# Patient Record
Sex: Female | Born: 1955 | Race: White | Hispanic: No | Smoking: Never smoker
Health system: Southern US, Community
[De-identification: ages and names within clinical notes are randomized; demographics above are authoritative.]

## PROBLEM LIST (undated history)

## (undated) DIAGNOSIS — G479 Sleep disorder, unspecified: Secondary | ICD-10-CM

## (undated) DIAGNOSIS — F32A Depression, unspecified: Secondary | ICD-10-CM

## (undated) DIAGNOSIS — K635 Polyp of colon: Secondary | ICD-10-CM

## (undated) DIAGNOSIS — R079 Chest pain, unspecified: Secondary | ICD-10-CM

## (undated) DIAGNOSIS — K760 Fatty (change of) liver, not elsewhere classified: Secondary | ICD-10-CM

## (undated) DIAGNOSIS — M7989 Other specified soft tissue disorders: Secondary | ICD-10-CM

## (undated) DIAGNOSIS — M2550 Pain in unspecified joint: Secondary | ICD-10-CM

## (undated) DIAGNOSIS — Q8789 Other specified congenital malformation syndromes, not elsewhere classified: Secondary | ICD-10-CM

## (undated) DIAGNOSIS — J189 Pneumonia, unspecified organism: Secondary | ICD-10-CM

## (undated) DIAGNOSIS — K5790 Diverticulosis of intestine, part unspecified, without perforation or abscess without bleeding: Secondary | ICD-10-CM

## (undated) DIAGNOSIS — K5900 Constipation, unspecified: Secondary | ICD-10-CM

## (undated) DIAGNOSIS — T7840XA Allergy, unspecified, initial encounter: Secondary | ICD-10-CM

## (undated) DIAGNOSIS — I10 Essential (primary) hypertension: Secondary | ICD-10-CM

## (undated) DIAGNOSIS — N952 Postmenopausal atrophic vaginitis: Secondary | ICD-10-CM

## (undated) DIAGNOSIS — L900 Lichen sclerosus et atrophicus: Secondary | ICD-10-CM

## (undated) DIAGNOSIS — R011 Cardiac murmur, unspecified: Secondary | ICD-10-CM

## (undated) DIAGNOSIS — H409 Unspecified glaucoma: Secondary | ICD-10-CM

## (undated) DIAGNOSIS — L853 Xerosis cutis: Secondary | ICD-10-CM

## (undated) DIAGNOSIS — J939 Pneumothorax, unspecified: Secondary | ICD-10-CM

## (undated) DIAGNOSIS — H43399 Other vitreous opacities, unspecified eye: Secondary | ICD-10-CM

## (undated) DIAGNOSIS — R0602 Shortness of breath: Secondary | ICD-10-CM

## (undated) DIAGNOSIS — K829 Disease of gallbladder, unspecified: Secondary | ICD-10-CM

## (undated) DIAGNOSIS — F329 Major depressive disorder, single episode, unspecified: Secondary | ICD-10-CM

## (undated) DIAGNOSIS — K859 Acute pancreatitis without necrosis or infection, unspecified: Secondary | ICD-10-CM

## (undated) DIAGNOSIS — B009 Herpesviral infection, unspecified: Secondary | ICD-10-CM

## (undated) DIAGNOSIS — E785 Hyperlipidemia, unspecified: Secondary | ICD-10-CM

## (undated) HISTORY — DX: Hyperlipidemia, unspecified: E78.5

## (undated) HISTORY — DX: Acute pancreatitis without necrosis or infection, unspecified: K85.90

## (undated) HISTORY — DX: Constipation, unspecified: K59.00

## (undated) HISTORY — DX: Essential (primary) hypertension: I10

## (undated) HISTORY — DX: Allergy, unspecified, initial encounter: T78.40XA

## (undated) HISTORY — PX: OTHER SURGICAL HISTORY: SHX169

## (undated) HISTORY — DX: Postmenopausal atrophic vaginitis: N95.2

## (undated) HISTORY — PX: ABDOMINAL HYSTERECTOMY: SHX81

## (undated) HISTORY — DX: Other vitreous opacities, unspecified eye: H43.399

## (undated) HISTORY — DX: Disease of gallbladder, unspecified: K82.9

## (undated) HISTORY — DX: Unspecified glaucoma: H40.9

## (undated) HISTORY — DX: Pain in unspecified joint: M25.50

## (undated) HISTORY — PX: COLONOSCOPY: SHX174

## (undated) HISTORY — DX: Pneumothorax, unspecified: J93.9

## (undated) HISTORY — DX: Fatty (change of) liver, not elsewhere classified: K76.0

## (undated) HISTORY — DX: Major depressive disorder, single episode, unspecified: F32.9

## (undated) HISTORY — PX: UPPER GASTROINTESTINAL ENDOSCOPY: SHX188

## (undated) HISTORY — DX: Cardiac murmur, unspecified: R01.1

## (undated) HISTORY — PX: BREAST LUMPECTOMY: SHX2

## (undated) HISTORY — DX: Other specified soft tissue disorders: M79.89

## (undated) HISTORY — DX: Shortness of breath: R06.02

## (undated) HISTORY — PX: POLYPECTOMY: SHX149

## (undated) HISTORY — DX: Lichen sclerosus et atrophicus: L90.0

## (undated) HISTORY — DX: Sleep disorder, unspecified: G47.9

## (undated) HISTORY — DX: Xerosis cutis: L85.3

## (undated) HISTORY — DX: Chest pain, unspecified: R07.9

## (undated) HISTORY — DX: Diverticulosis of intestine, part unspecified, without perforation or abscess without bleeding: K57.90

## (undated) HISTORY — DX: Other specified congenital malformation syndromes, not elsewhere classified: Q87.89

## (undated) HISTORY — PX: CHOLECYSTECTOMY: SHX55

## (undated) HISTORY — DX: Depression, unspecified: F32.A

## (undated) HISTORY — DX: Polyp of colon: K63.5

## (undated) HISTORY — DX: Herpesviral infection, unspecified: B00.9

---

## 1997-12-10 HISTORY — PX: BREAST EXCISIONAL BIOPSY: SUR124

## 1998-03-24 ENCOUNTER — Ambulatory Visit (HOSPITAL_COMMUNITY): Admission: RE | Admit: 1998-03-24 | Discharge: 1998-03-24 | Payer: Self-pay | Admitting: General Surgery

## 1998-05-20 ENCOUNTER — Other Ambulatory Visit: Admission: RE | Admit: 1998-05-20 | Discharge: 1998-05-20 | Payer: Self-pay | Admitting: Obstetrics and Gynecology

## 1998-06-01 ENCOUNTER — Ambulatory Visit (HOSPITAL_COMMUNITY): Admission: RE | Admit: 1998-06-01 | Discharge: 1998-06-01 | Payer: Self-pay | Admitting: Obstetrics and Gynecology

## 1998-06-02 ENCOUNTER — Ambulatory Visit (HOSPITAL_COMMUNITY): Admission: RE | Admit: 1998-06-02 | Discharge: 1998-06-02 | Payer: Self-pay | Admitting: Obstetrics and Gynecology

## 1998-06-14 ENCOUNTER — Ambulatory Visit (HOSPITAL_COMMUNITY): Admission: RE | Admit: 1998-06-14 | Discharge: 1998-06-14 | Payer: Self-pay | Admitting: *Deleted

## 1998-06-14 HISTORY — PX: BREAST BIOPSY: SHX20

## 1998-12-28 ENCOUNTER — Ambulatory Visit (HOSPITAL_COMMUNITY): Admission: RE | Admit: 1998-12-28 | Discharge: 1998-12-28 | Payer: Self-pay | Admitting: *Deleted

## 1999-05-26 ENCOUNTER — Ambulatory Visit (HOSPITAL_COMMUNITY): Admission: RE | Admit: 1999-05-26 | Discharge: 1999-05-26 | Payer: Self-pay | Admitting: Obstetrics and Gynecology

## 1999-06-01 ENCOUNTER — Other Ambulatory Visit: Admission: RE | Admit: 1999-06-01 | Discharge: 1999-06-01 | Payer: Self-pay | Admitting: Obstetrics and Gynecology

## 1999-06-02 ENCOUNTER — Ambulatory Visit (HOSPITAL_COMMUNITY): Admission: RE | Admit: 1999-06-02 | Discharge: 1999-06-02 | Payer: Self-pay | Admitting: *Deleted

## 1999-12-11 DIAGNOSIS — F32A Depression, unspecified: Secondary | ICD-10-CM

## 1999-12-11 HISTORY — DX: Depression, unspecified: F32.A

## 2000-01-02 ENCOUNTER — Encounter (INDEPENDENT_AMBULATORY_CARE_PROVIDER_SITE_OTHER): Payer: Self-pay | Admitting: *Deleted

## 2000-01-02 ENCOUNTER — Ambulatory Visit (HOSPITAL_BASED_OUTPATIENT_CLINIC_OR_DEPARTMENT_OTHER): Admission: RE | Admit: 2000-01-02 | Discharge: 2000-01-02 | Payer: Self-pay | Admitting: General Surgery

## 2000-06-17 ENCOUNTER — Encounter: Payer: Self-pay | Admitting: General Surgery

## 2000-06-17 ENCOUNTER — Ambulatory Visit (HOSPITAL_COMMUNITY): Admission: RE | Admit: 2000-06-17 | Discharge: 2000-06-17 | Payer: Self-pay | Admitting: General Surgery

## 2000-06-25 ENCOUNTER — Other Ambulatory Visit: Admission: RE | Admit: 2000-06-25 | Discharge: 2000-06-25 | Payer: Self-pay | Admitting: Obstetrics and Gynecology

## 2001-05-29 ENCOUNTER — Ambulatory Visit (HOSPITAL_COMMUNITY): Admission: RE | Admit: 2001-05-29 | Discharge: 2001-05-29 | Payer: Self-pay | Admitting: Obstetrics and Gynecology

## 2001-05-29 ENCOUNTER — Encounter: Payer: Self-pay | Admitting: Obstetrics and Gynecology

## 2001-06-30 ENCOUNTER — Other Ambulatory Visit: Admission: RE | Admit: 2001-06-30 | Discharge: 2001-06-30 | Payer: Self-pay | Admitting: Obstetrics and Gynecology

## 2003-05-27 ENCOUNTER — Ambulatory Visit (HOSPITAL_COMMUNITY): Admission: RE | Admit: 2003-05-27 | Discharge: 2003-05-27 | Payer: Self-pay | Admitting: Obstetrics and Gynecology

## 2003-05-27 ENCOUNTER — Encounter: Payer: Self-pay | Admitting: Obstetrics and Gynecology

## 2003-10-09 ENCOUNTER — Emergency Department (HOSPITAL_COMMUNITY): Admission: AD | Admit: 2003-10-09 | Discharge: 2003-10-09 | Payer: Self-pay | Admitting: Emergency Medicine

## 2003-12-07 ENCOUNTER — Ambulatory Visit (HOSPITAL_COMMUNITY): Admission: RE | Admit: 2003-12-07 | Discharge: 2003-12-07 | Payer: Self-pay | Admitting: Internal Medicine

## 2005-09-10 ENCOUNTER — Ambulatory Visit (HOSPITAL_COMMUNITY): Admission: RE | Admit: 2005-09-10 | Discharge: 2005-09-10 | Payer: Self-pay | Admitting: Internal Medicine

## 2005-09-26 ENCOUNTER — Encounter: Admission: RE | Admit: 2005-09-26 | Discharge: 2005-09-26 | Payer: Self-pay | Admitting: Internal Medicine

## 2006-03-29 ENCOUNTER — Encounter (INDEPENDENT_AMBULATORY_CARE_PROVIDER_SITE_OTHER): Payer: Self-pay | Admitting: *Deleted

## 2006-03-29 ENCOUNTER — Encounter: Admission: RE | Admit: 2006-03-29 | Discharge: 2006-03-29 | Payer: Self-pay | Admitting: Internal Medicine

## 2006-03-29 HISTORY — PX: BREAST BIOPSY: SHX20

## 2006-04-10 ENCOUNTER — Encounter: Payer: Self-pay | Admitting: Obstetrics and Gynecology

## 2006-11-01 ENCOUNTER — Encounter: Admission: RE | Admit: 2006-11-01 | Discharge: 2006-11-01 | Payer: Self-pay | Admitting: Obstetrics and Gynecology

## 2007-01-02 ENCOUNTER — Ambulatory Visit: Payer: Self-pay | Admitting: Internal Medicine

## 2007-01-15 ENCOUNTER — Ambulatory Visit: Payer: Self-pay | Admitting: Internal Medicine

## 2007-01-15 ENCOUNTER — Encounter (INDEPENDENT_AMBULATORY_CARE_PROVIDER_SITE_OTHER): Payer: Self-pay | Admitting: *Deleted

## 2007-11-11 ENCOUNTER — Ambulatory Visit (HOSPITAL_COMMUNITY): Admission: RE | Admit: 2007-11-11 | Discharge: 2007-11-11 | Payer: Self-pay | Admitting: Obstetrics and Gynecology

## 2008-11-15 ENCOUNTER — Ambulatory Visit (HOSPITAL_COMMUNITY): Admission: RE | Admit: 2008-11-15 | Discharge: 2008-11-15 | Payer: Self-pay | Admitting: Obstetrics and Gynecology

## 2008-12-27 ENCOUNTER — Emergency Department (HOSPITAL_COMMUNITY): Admission: EM | Admit: 2008-12-27 | Discharge: 2008-12-27 | Payer: Self-pay | Admitting: Emergency Medicine

## 2009-12-12 ENCOUNTER — Encounter: Admission: RE | Admit: 2009-12-12 | Discharge: 2009-12-12 | Payer: Self-pay | Admitting: Obstetrics and Gynecology

## 2009-12-13 DIAGNOSIS — R05 Cough: Secondary | ICD-10-CM | POA: Insufficient documentation

## 2010-01-26 DIAGNOSIS — F329 Major depressive disorder, single episode, unspecified: Secondary | ICD-10-CM | POA: Insufficient documentation

## 2010-01-26 DIAGNOSIS — K851 Biliary acute pancreatitis without necrosis or infection: Secondary | ICD-10-CM | POA: Insufficient documentation

## 2010-01-26 DIAGNOSIS — R0789 Other chest pain: Secondary | ICD-10-CM | POA: Insufficient documentation

## 2010-01-26 DIAGNOSIS — Z79899 Other long term (current) drug therapy: Secondary | ICD-10-CM | POA: Insufficient documentation

## 2010-01-26 DIAGNOSIS — R635 Abnormal weight gain: Secondary | ICD-10-CM | POA: Insufficient documentation

## 2010-02-17 DIAGNOSIS — Z0289 Encounter for other administrative examinations: Secondary | ICD-10-CM | POA: Insufficient documentation

## 2010-07-12 DIAGNOSIS — IMO0002 Reserved for concepts with insufficient information to code with codable children: Secondary | ICD-10-CM | POA: Insufficient documentation

## 2010-07-14 ENCOUNTER — Encounter: Admission: RE | Admit: 2010-07-14 | Discharge: 2010-07-14 | Payer: Self-pay | Admitting: Internal Medicine

## 2010-12-13 ENCOUNTER — Ambulatory Visit (HOSPITAL_COMMUNITY)
Admission: RE | Admit: 2010-12-13 | Discharge: 2010-12-13 | Payer: Self-pay | Source: Home / Self Care | Attending: Internal Medicine | Admitting: Internal Medicine

## 2010-12-31 ENCOUNTER — Encounter: Payer: Self-pay | Admitting: Endocrinology

## 2011-01-12 ENCOUNTER — Other Ambulatory Visit (HOSPITAL_BASED_OUTPATIENT_CLINIC_OR_DEPARTMENT_OTHER): Payer: Self-pay | Admitting: Internal Medicine

## 2011-01-12 DIAGNOSIS — M542 Cervicalgia: Secondary | ICD-10-CM

## 2011-01-12 DIAGNOSIS — R200 Anesthesia of skin: Secondary | ICD-10-CM | POA: Insufficient documentation

## 2011-01-22 ENCOUNTER — Ambulatory Visit
Admission: RE | Admit: 2011-01-22 | Discharge: 2011-01-22 | Disposition: A | Discharge status: Home / Self Care | Payer: BC Managed Care – PPO | Source: Ambulatory Visit | Attending: Internal Medicine | Admitting: Internal Medicine

## 2011-01-22 DIAGNOSIS — M542 Cervicalgia: Secondary | ICD-10-CM

## 2011-03-26 LAB — POCT I-STAT, CHEM 8
BUN: 11 mg/dL (ref 6–23)
Calcium, Ion: 1.13 mmol/L (ref 1.12–1.32)
Chloride: 106 mEq/L (ref 96–112)
Creatinine, Ser: 0.8 mg/dL (ref 0.4–1.2)
Glucose, Bld: 93 mg/dL (ref 70–99)
HCT: 44 % (ref 36.0–46.0)
Hemoglobin: 15 g/dL (ref 12.0–15.0)
Potassium: 3.8 mEq/L (ref 3.5–5.1)
Sodium: 142 mEq/L (ref 135–145)
TCO2: 26 mmol/L (ref 0–100)

## 2011-03-26 LAB — DIFFERENTIAL
Basophils Absolute: 0 10*3/uL (ref 0.0–0.1)
Basophils Relative: 0 % (ref 0–1)
Eosinophils Absolute: 0.1 10*3/uL (ref 0.0–0.7)
Eosinophils Relative: 2 % (ref 0–5)
Lymphocytes Relative: 33 % (ref 12–46)
Lymphs Abs: 2.7 10*3/uL (ref 0.7–4.0)
Monocytes Absolute: 0.4 10*3/uL (ref 0.1–1.0)
Monocytes Relative: 5 % (ref 3–12)
Neutro Abs: 4.9 10*3/uL (ref 1.7–7.7)
Neutrophils Relative %: 60 % (ref 43–77)

## 2011-03-26 LAB — POCT CARDIAC MARKERS
CKMB, poc: <1 ng/mL — ABNORMAL LOW (ref 1.0–8.0)
CKMB, poc: <1 ng/mL — ABNORMAL LOW (ref 1.0–8.0)
Myoglobin, poc: 61.9 ng/mL (ref 12–200)
Myoglobin, poc: 70.2 ng/mL (ref 12–200)
Troponin i, poc: <0.05 ng/mL (ref 0.00–0.09)
Troponin i, poc: <0.05 ng/mL (ref 0.00–0.09)

## 2011-03-26 LAB — CBC
HCT: 43.6 % (ref 36.0–46.0)
Hemoglobin: 15.1 g/dL — ABNORMAL HIGH (ref 12.0–15.0)
MCHC: 34.6 g/dL (ref 30.0–36.0)
MCV: 89.3 fL (ref 78.0–100.0)
Platelets: 221 10*3/uL (ref 150–400)
RBC: 4.88 MIL/uL (ref 3.87–5.11)
RDW: 12.8 % (ref 11.5–15.5)
WBC: 8.2 10*3/uL (ref 4.0–10.5)

## 2011-04-27 NOTE — Op Note (Signed)
[place]. [facility]  Patient:    Julie Moran                      MRN: [id number] Proc. Date: 01/02/00 Adm. Date:  [id number] Attending:  Janalyn Rouse                           Operative Report  PREOPERATIVE DIAGNOSIS:  Bilateral breast fibroadenomas.  POSTOPERATIVE DIAGNOSIS:  Bilateral breast fibroadenomas.  OPERATION:  Excision of bilateral breast fibroadenomas.  SURGEON:  Rose Phi. Maple Hudson, M.D.  ANESTHESIA:  MAC  DESCRIPTION OF PROCEDURE:  The patient was placed on the operating table with both arms extended on the arm board.  Both breasts were prepped and draped in the usual fashion.  The small fibroadenoma of the right breast was centered at the 12 oclock position just under a previous scar.  The one in the left breast was circumareolar in location and centered at about the 11 oclock position.  Curvilinear incisions were outlined over both of the palpable masses.  The areas were thoroughly infiltrated with 1% Xylocaine with adrenaline.  The left side was done first with a curved incision and hemostasis obtained with a cautery.  The solid tumor was grasped and excised.  It appeared to be a fibroadenoma.  Hemostasis was obtained with cautery.  The deeper glandular breast tissue was approximated with 3-0 Vicryl and the skin closed with a subcuticular 4-0 Monocryl and Steri-Strips.  A small curved incision overlying the smaller palpable lesion at the 12 oclock position of the right breast was then made after infiltrating the area with 1% Xylocaine.  The small fibroadenoma was excised, and subcuticular closure with 4-0 Monocryl and Steri-Strips was carried out.  Dressing was applied.  The patient transferred to the recovery room in satisfactory condition having tolerated the  procedure well. DD:  01/02/00 TD:  01/03/00 Job: [id number]

## 2011-12-24 ENCOUNTER — Encounter: Payer: Self-pay | Admitting: Internal Medicine

## 2012-01-07 ENCOUNTER — Other Ambulatory Visit (HOSPITAL_COMMUNITY): Payer: Self-pay | Admitting: Internal Medicine

## 2012-01-07 DIAGNOSIS — Z1231 Encounter for screening mammogram for malignant neoplasm of breast: Secondary | ICD-10-CM

## 2012-01-14 ENCOUNTER — Ambulatory Visit (AMBULATORY_SURGERY_CENTER): Payer: BC Managed Care – PPO | Admitting: *Deleted

## 2012-01-14 DIAGNOSIS — Z86010 Personal history of colonic polyps: Secondary | ICD-10-CM

## 2012-01-14 DIAGNOSIS — Z1211 Encounter for screening for malignant neoplasm of colon: Secondary | ICD-10-CM

## 2012-01-14 MED ORDER — PEG-KCL-NACL-NASULF-NA ASC-C 100 G PO SOLR: ORAL | Status: DC

## 2012-01-14 NOTE — Progress Notes (Signed)
No allergy to egg or soy products  

## 2012-01-15 ENCOUNTER — Encounter: Payer: Self-pay | Admitting: Internal Medicine

## 2012-01-28 ENCOUNTER — Ambulatory Visit (AMBULATORY_SURGERY_CENTER): Payer: BC Managed Care – PPO | Admitting: Internal Medicine

## 2012-01-28 ENCOUNTER — Encounter: Payer: Self-pay | Admitting: Internal Medicine

## 2012-01-28 VITALS — BP 135/75 | HR 57 | Temp 96.0°F | Resp 18 | Ht 63.0 in | Wt 185.0 lb

## 2012-01-28 DIAGNOSIS — D126 Benign neoplasm of colon, unspecified: Secondary | ICD-10-CM

## 2012-01-28 DIAGNOSIS — Z86010 Personal history of colonic polyps: Secondary | ICD-10-CM

## 2012-01-28 DIAGNOSIS — Z1211 Encounter for screening for malignant neoplasm of colon: Secondary | ICD-10-CM

## 2012-01-28 MED ORDER — SODIUM CHLORIDE 0.9 % IV SOLN: 500.0000 mL | INTRAVENOUS | Status: DC

## 2012-01-28 NOTE — Progress Notes (Signed)
Patient did not experience any of the following events: a burn prior to discharge; a fall within the facility; wrong site/side/patient/procedure/implant event; or a hospital transfer or hospital admission upon discharge from the facility. (G8907) Patient did not have preoperative order for IV antibiotic SSI prophylaxis. (G8918)  

## 2012-01-28 NOTE — Patient Instructions (Signed)

## 2012-01-28 NOTE — Op Note (Signed)
[facility] [address] [place], [place]  [address]  COLONOSCOPY PROCEDURE REPORT  PATIENT:  Julie Moran, Julie Moran  MR#:  [id number] BIRTHDATE:  January 09, 1956, 55 yrs. old  GENDER:  female ENDOSCOPIST:  Wilhemina Bonito. Eda Keys, MD REF. BY:  Surveillance Program Recall, PROCEDURE DATE:  01/28/2012 PROCEDURE:  Colonoscopy with snare polypectomy x 5 ASA CLASS:  Class II INDICATIONS:  history of pre-cancerous (adenomatous) colon polyps, surveillance and high-risk screening ; index exam 01-2007 w/ small ascending TA MEDICATIONS:   MAC sedation, administered by CRNA, propofol (Diprivan) 380 mg IV  DESCRIPTION OF PROCEDURE:   After the risks benefits and alternatives of the procedure were thoroughly explained, informed consent was obtained.  Digital rectal exam was performed and revealed no abnormalities.   The LB [id number] endoscope was introduced through the anus and advanced to the cecum, which was identified by both the appendix and ileocecal valve, without limitations.  The quality of the prep was excellent, using MoviPrep.  The instrument was then slowly withdrawn as the colon was fully examined. <<PROCEDUREIMAGES>>  FINDINGS:  There were five sessile polyps identified and removed in the ascending (87mm,10mm,15mm) and transverse (87mm,8mm) colon. Polyps were snared without cautery. Retrieval was successful.These appearred to be hyperplastic or serrated adenomas.  Mild diverticulosis was found in the sigmoid colon.  Otherwise normal colonoscopy without other polyps, masses, vascular ectasias, or inflammatory changes.   Retroflexed views in the rectum revealed no abnormalities.    The time to cecum = 4:41  minutes. The scope was then withdrawn in  27:07  minutes from the cecum and the procedure completed.  COMPLICATIONS:  None ENDOSCOPIC IMPRESSION: 1) Polyps, multiple in the  colon - removed 2) Mild diverticulosis in the sigmoid colon 3) Otherwise normal  colonoscopy  RECOMMENDATIONS: 1) Repeat Colonoscopy in 3 or 5 years, pending pathology results. 2) LAMISIL CREAM TWICE DAILY TO AFFECTED AREA FOR 2-3 WEEKS.  ______________________________ Wilhemina Bonito. Eda Keys, MD  CC:  Jarome Matin, MD;  The Patient  n. eSIGNED:   Wilhemina Bonito. Eda Keys at 01/28/2012 11:56 AM  [place]pone, Cordelia Pen, [phone]

## 2012-01-29 ENCOUNTER — Telehealth: Payer: Self-pay | Admitting: *Deleted

## 2012-01-29 NOTE — Telephone Encounter (Signed)
  Follow up Call-  Call back number 01/28/2012  Post procedure Call Back phone  # [phone]  Permission to leave phone message Yes     Patient questions:  Do you have a fever, pain , or abdominal swelling? no Pain Score  0 *  Have you tolerated food without any problems? yes  Have you been able to return to your normal activities? yes  Do you have any questions about your discharge instructions: Diet   no Medications  no Follow up visit  no  Do you have questions or concerns about your Care? no  Actions: * If pain score is 4 or above: No action needed, pain <4.

## 2012-02-01 ENCOUNTER — Encounter: Payer: Self-pay | Admitting: Internal Medicine

## 2012-02-05 ENCOUNTER — Ambulatory Visit (HOSPITAL_COMMUNITY)
Admission: RE | Admit: 2012-02-05 | Discharge: 2012-02-05 | Disposition: A | Discharge status: Home / Self Care | Payer: BC Managed Care – PPO | Source: Ambulatory Visit | Attending: Internal Medicine | Admitting: Internal Medicine

## 2012-02-05 DIAGNOSIS — Z1231 Encounter for screening mammogram for malignant neoplasm of breast: Secondary | ICD-10-CM

## 2012-03-10 DIAGNOSIS — E669 Obesity, unspecified: Secondary | ICD-10-CM | POA: Insufficient documentation

## 2012-03-10 DIAGNOSIS — F329 Major depressive disorder, single episode, unspecified: Secondary | ICD-10-CM | POA: Insufficient documentation

## 2012-03-10 DIAGNOSIS — R7301 Impaired fasting glucose: Secondary | ICD-10-CM | POA: Insufficient documentation

## 2012-07-02 ENCOUNTER — Encounter (HOSPITAL_COMMUNITY): Payer: Self-pay | Admitting: *Deleted

## 2012-07-02 ENCOUNTER — Emergency Department (HOSPITAL_COMMUNITY)
Admission: EM | Admit: 2012-07-02 | Discharge: 2012-07-02 | Disposition: A | Discharge status: Home / Self Care | Payer: BC Managed Care – PPO | Attending: Emergency Medicine | Admitting: Emergency Medicine

## 2012-07-02 ENCOUNTER — Emergency Department (HOSPITAL_COMMUNITY): Payer: BC Managed Care – PPO

## 2012-07-02 DIAGNOSIS — I10 Essential (primary) hypertension: Secondary | ICD-10-CM | POA: Insufficient documentation

## 2012-07-02 DIAGNOSIS — R079 Chest pain, unspecified: Secondary | ICD-10-CM

## 2012-07-02 DIAGNOSIS — J438 Other emphysema: Secondary | ICD-10-CM | POA: Insufficient documentation

## 2012-07-02 DIAGNOSIS — N390 Urinary tract infection, site not specified: Secondary | ICD-10-CM | POA: Insufficient documentation

## 2012-07-02 LAB — CBC
HCT: 40.9 % (ref 36.0–46.0)
Hemoglobin: 14.4 g/dL (ref 12.0–15.0)
MCH: 30.1 pg (ref 26.0–34.0)
MCHC: 35.2 g/dL (ref 30.0–36.0)
MCV: 85.4 fL (ref 78.0–100.0)
Platelets: 165 10*3/uL (ref 150–400)
RBC: 4.79 MIL/uL (ref 3.87–5.11)
RDW: 12.9 % (ref 11.5–15.5)
WBC: 7.8 10*3/uL (ref 4.0–10.5)

## 2012-07-02 LAB — BASIC METABOLIC PANEL
BUN: 25 mg/dL — ABNORMAL HIGH (ref 6–23)
CO2: 23 mEq/L (ref 19–32)
Calcium: 9.6 mg/dL (ref 8.4–10.5)
Chloride: 103 mEq/L (ref 96–112)
Creatinine, Ser: 0.97 mg/dL (ref 0.50–1.10)
GFR calc Af Amer: 73 mL/min — ABNORMAL LOW (ref >90)
GFR calc non Af Amer: 63 mL/min — ABNORMAL LOW (ref >90)
Glucose, Bld: 106 mg/dL — ABNORMAL HIGH (ref 70–99)
Potassium: 3.8 mEq/L (ref 3.5–5.1)
Sodium: 139 mEq/L (ref 135–145)

## 2012-07-02 LAB — HEPATIC FUNCTION PANEL
ALT: 32 U/L (ref 0–35)
AST: 19 U/L (ref 0–37)
Albumin: 3.9 g/dL (ref 3.5–5.2)
Alkaline Phosphatase: 118 U/L — ABNORMAL HIGH (ref 39–117)
Bilirubin, Direct: <0.1 mg/dL (ref 0.0–0.3)
Total Bilirubin: 0.3 mg/dL (ref 0.3–1.2)
Total Protein: 7.1 g/dL (ref 6.0–8.3)

## 2012-07-02 LAB — URINALYSIS, ROUTINE W REFLEX MICROSCOPIC
Bilirubin Urine: NEGATIVE
Glucose, UA: NEGATIVE mg/dL
Hgb urine dipstick: NEGATIVE
Ketones, ur: NEGATIVE mg/dL
Nitrite: NEGATIVE
Protein, ur: NEGATIVE mg/dL
Specific Gravity, Urine: 1.016 (ref 1.005–1.030)
Urobilinogen, UA: 0.2 mg/dL (ref 0.0–1.0)
pH: 6 (ref 5.0–8.0)

## 2012-07-02 LAB — TROPONIN I: Troponin I: <0.3 ng/mL (ref <0.30)

## 2012-07-02 LAB — URINE MICROSCOPIC-ADD ON

## 2012-07-02 LAB — POCT I-STAT TROPONIN I: Troponin i, poc: 0 ng/mL (ref 0.00–0.08)

## 2012-07-02 MED ORDER — NITROFURANTOIN MONOHYD MACRO 100 MG PO CAPS: 100.0000 mg | ORAL_CAPSULE | Freq: Two times a day (BID) | ORAL | Status: AC

## 2012-07-02 MED ORDER — NITROFURANTOIN MONOHYD MACRO 100 MG PO CAPS
100.0000 mg | ORAL_CAPSULE | Freq: Once | ORAL | Status: AC
  Administered 2012-07-02: 100 mg via ORAL
  Filled 2012-07-02: qty 1

## 2012-07-02 MED ORDER — ASPIRIN 81 MG PO CHEW
CHEWABLE_TABLET | ORAL | Status: AC
  Administered 2012-07-02: 162 mg via ORAL
  Filled 2012-07-02: qty 2

## 2012-07-02 MED ORDER — ASPIRIN 81 MG PO CHEW
162.0000 mg | CHEWABLE_TABLET | Freq: Once | ORAL | Status: AC
  Administered 2012-07-02: 162 mg via ORAL

## 2012-07-02 NOTE — ED Notes (Signed)
EKG done at [phone]

## 2012-07-02 NOTE — ED Notes (Signed)
Patient transported to X-ray 

## 2012-07-02 NOTE — ED Provider Notes (Signed)
History     CSN: [id number]  Arrival date & time 07/02/12  0440   First MD Initiated Contact with Patient 07/02/12 [phone]      Chief Complaint  Patient presents with  . Chest Pain    (Consider location/radiation/quality/duration/timing/severity/associated sxs/prior treatment) HPI  56 year old female with history of hypertension, hyperlipidemia, and heart murmur presents complaining of chest pain. Patient reports a last night while trying to sleep she experiencing chest pressure to the mid chest which radiates to her shoulder blade. Describe depression tightness has persistent, lasting for about an hour and has resolved. She also reports heart palpitation at that time. She reports no shortness of breath, nausea, diaphoresis. She reports no fever, chills, cough, abdominal pain, rash, or recent trauma. Denies any exertional component. Patient did take 2 baby aspirin at home, and received 2 more in the ED. The symptom has resolved. She does report similar symptoms about 6-7 years ago. She did undergo a stress test and a nuclear medicine test at that time and the results were inconclusive. Patient does have family history of heart disease, but no premature death. Patient is a nonsmoker.  Past Medical History  Diagnosis Date  . Allergy   . Heart murmur   . Hyperlipidemia   . Hypertension     Past Surgical History  Procedure Date  . Colonoscopy   . Breast lumpectomy     x5 times, both breasts  . Hysterctomy     abdominal  . Right ovary removed   . Cholecystectomy   . Upper gastrointestinal endoscopy   . Polypectomy     Family History  Problem Relation Age of Onset  . Colon cancer Neg Hx   . Esophageal cancer Neg Hx   . Stomach cancer Neg Hx   . Rectal cancer Neg Hx   . Diabetes Mother   . Breast cancer Mother   . Diabetes Father   . Prostate cancer Father   . Heart disease Father   . Liver disease Paternal Grandmother     History  Substance Use Topics  . Smoking status:  Never Smoker   . Smokeless tobacco: Not on file  . Alcohol Use: No    OB History    Grav Para Term Preterm Abortions TAB SAB Ect Mult Living                  Review of Systems  All other systems reviewed and are negative.    Allergies  Review of patient's allergies indicates no known allergies.  Home Medications   Current Outpatient Rx  Name Route Sig Dispense Refill  . AMLODIPINE BESYLATE 5 MG PO TABS Oral Take 1 tablet by mouth Daily.     Marland Kitchen CLOBETASOL PROPIONATE 0.05 % EX CREA Topical Apply topically 2 (two) times a week.    Marland Kitchen DIOVAN 160 MG PO TABS Oral Take 1 tablet by mouth daily.     Marland Kitchen PRAVASTATIN SODIUM 20 MG PO TABS Oral Take 1 tablet by mouth daily.       BP 148/61  Pulse 60  Temp 97.8 F (36.6 C)  Resp 20  SpO2 100%  Physical Exam  Nursing note and vitals reviewed. Constitutional: She appears well-developed and well-nourished. No distress.       Awake, alert, nontoxic appearance  HENT:  Head: Atraumatic.  Eyes: Conjunctivae are normal. Right eye exhibits no discharge. Left eye exhibits no discharge.  Neck: Neck supple.  Cardiovascular: Normal rate and regular rhythm.   Pulmonary/Chest: Effort  normal. No respiratory distress. She exhibits no tenderness.  Abdominal: Soft. There is no tenderness. There is no rebound.  Musculoskeletal: She exhibits no edema and no tenderness.       ROM appears intact, no obvious focal weakness  Neurological:       Mental status and motor strength appears intact  Skin: No rash noted.  Psychiatric: She has a normal mood and affect.    ED Course  Procedures (including critical care time)   Labs Reviewed  CBC  POCT I-STAT TROPONIN I  BASIC METABOLIC PANEL  HEPATIC FUNCTION PANEL   Dg Chest 2 View  07/02/2012  *RADIOLOGY REPORT*  Clinical Data: Mid left chest pain.  CHEST - 2 VIEW  Comparison: 12/27/2008  Findings: Emphysema noted. Cardiac and mediastinal contours appear unremarkable.  No pleural effusion or airspace  opacity identified.  IMPRESSION:  1.  Emphysema.   Otherwise, no significant abnormality identified.  Original Report Authenticated By: Dellia Cloud, M.D.     No diagnosis found.   Date: 07/02/2012  Rate: 57  Rhythm: normal sinus rhythm  QRS Axis: normal  Intervals: normal  ST/T Wave abnormalities: normal  Conduction Disutrbances: none  Narrative Interpretation:   Old EKG Reviewed: No significant changes noted  Results for orders placed during the hospital encounter of 07/02/12  CBC      Component Value Range   WBC 7.8  4.0 - 10.5 K/uL   RBC 4.79  3.87 - 5.11 MIL/uL   Hemoglobin 14.4  12.0 - 15.0 g/dL   HCT 16.1  09.6 - 04.5 %   MCV 85.4  78.0 - 100.0 fL   MCH 30.1  26.0 - 34.0 pg   MCHC 35.2  30.0 - 36.0 g/dL   RDW 40.9  81.1 - 91.4 %   Platelets 165  150 - 400 K/uL  BASIC METABOLIC PANEL      Component Value Range   Sodium 139  135 - 145 mEq/L   Potassium 3.8  3.5 - 5.1 mEq/L   Chloride 103  96 - 112 mEq/L   CO2 23  19 - 32 mEq/L   Glucose, Bld 106 (*) 70 - 99 mg/dL   BUN 25 (*) 6 - 23 mg/dL   Creatinine, Ser 7.82  0.50 - 1.10 mg/dL   Calcium 9.6  8.4 - 95.6 mg/dL   GFR calc non Af Amer 63 (*) >90 mL/min   GFR calc Af Amer 73 (*) >90 mL/min  HEPATIC FUNCTION PANEL      Component Value Range   Total Protein 7.1  6.0 - 8.3 g/dL   Albumin 3.9  3.5 - 5.2 g/dL   AST 19  0 - 37 U/L   ALT 32  0 - 35 U/L   Alkaline Phosphatase 118 (*) 39 - 117 U/L   Total Bilirubin 0.3  0.3 - 1.2 mg/dL   Bilirubin, Direct <2.1  0.0 - 0.3 mg/dL   Indirect Bilirubin NOT CALCULATED  0.3 - 0.9 mg/dL  POCT I-STAT TROPONIN I      Component Value Range   Troponin i, poc 0.00  0.00 - 0.08 ng/mL   Comment 3           URINALYSIS, ROUTINE W REFLEX MICROSCOPIC      Component Value Range   Color, Urine YELLOW  YELLOW   APPearance CLEAR  CLEAR   Specific Gravity, Urine 1.016  1.005 - 1.030   pH 6.0  5.0 - 8.0   Glucose, UA NEGATIVE  NEGATIVE mg/dL   Hgb urine dipstick NEGATIVE   NEGATIVE   Bilirubin Urine NEGATIVE  NEGATIVE   Ketones, ur NEGATIVE  NEGATIVE mg/dL   Protein, ur NEGATIVE  NEGATIVE mg/dL   Urobilinogen, UA 0.2  0.0 - 1.0 mg/dL   Nitrite NEGATIVE  NEGATIVE   Leukocytes, UA LARGE (*) NEGATIVE  URINE MICROSCOPIC-ADD ON      Component Value Range   Squamous Epithelial / LPF RARE  RARE   WBC, UA 11-20  <3 WBC/hpf   Bacteria, UA MANY (*) RARE  TROPONIN I      Component Value Range   Troponin I <0.30  <0.30 ng/mL   Dg Chest 2 View  07/02/2012  *RADIOLOGY REPORT*  Clinical Data: Mid left chest pain.  CHEST - 2 VIEW  Comparison: 12/27/2008  Findings: Emphysema noted. Cardiac and mediastinal contours appear unremarkable.  No pleural effusion or airspace opacity identified.  IMPRESSION:  1.  Emphysema.   Otherwise, no significant abnormality identified.  Original Report Authenticated By: Dellia Cloud, M.D.    1. Chest pain 2. UTI   MDM  56 year old female of chief complaints of chest pain. Her symptoms resolved. Her vital signs stable. First set of troponin is unremarkable. Her EKG is unremarkable. Last cardiac stress test was 6-7 years ago. TIMI 0-1, HEART score <4.  Will obtain delta troponin.  Discussed with my attending.    8:12 AM UA shows evidence of urinary tract infection. Patient does report some mild urinary discomfort for the past week but did not think that it was a urinary tract infection. She is amenable to treatment.   9:35 AM Negative delta troponin.  Low cardiac risk.  Pt to f/u with PCP for further management.  Will d/c with macrobid for UTI.    Nursing notes reviewed and considered in documentation  Previous records reviewed and considered  All labs/vitals reviewed and considered  xrays reviewed and considered   Fayrene Helper, PA-C 07/02/12 0936  Fayrene Helper, PA-C 07/02/12 [phone]

## 2012-07-02 NOTE — ED Provider Notes (Signed)
Medical screening examination/treatment/procedure(s) were conducted as a shared visit with non-physician practitioner(s) and myself.  I personally evaluated the patient during the encounter  Patient with chest pain radiating to her shoulder that occurred at rest.  It was not associated with nausea or diaphoresis.  She has no shortness of breath.  It is spontaneously resolve.  Patient has no prior cardiac history.  She is low risk by heart score with a score of 2.  Patient does have a primary care physician and actually has an appointment in the next few weeks.  I've recommended that if 2 sets of cardiac markers are negative since the patient's EKG is normal she can be discharged and followup with her primary care physician for a repeat stress test.  Nat Christen, MD 07/02/12 [phone]

## 2012-07-02 NOTE — ED Notes (Signed)
Husband states wife took 2 81mg  of asa 0440.

## 2012-07-02 NOTE — ED Notes (Signed)
Pt asked about eating, NT Townsend asked PA Greta Doom and was advised to wait until lab resulted.

## 2012-07-02 NOTE — ED Notes (Signed)
Pt states developed chest tightness while trying to go to sleep about 45 mins ago.  States also has pain across her back; denies nausea and shortness of breath; states had similar pain in past and was evaluated at ER

## 2012-07-05 NOTE — ED Provider Notes (Signed)
Medical screening examination/treatment/procedure(s) were conducted as a shared visit with non-physician practitioner(s) and myself.  I personally evaluated the patient during the encounter   Lanessa Shill A Tabbetha Kutscher, MD 07/05/12 0842 

## 2012-11-27 DIAGNOSIS — J310 Chronic rhinitis: Secondary | ICD-10-CM | POA: Insufficient documentation

## 2012-11-27 DIAGNOSIS — R21 Rash and other nonspecific skin eruption: Secondary | ICD-10-CM | POA: Insufficient documentation

## 2012-12-09 DIAGNOSIS — J0100 Acute maxillary sinusitis, unspecified: Secondary | ICD-10-CM | POA: Insufficient documentation

## 2013-01-07 ENCOUNTER — Other Ambulatory Visit (HOSPITAL_COMMUNITY): Payer: Self-pay | Admitting: Internal Medicine

## 2013-01-07 DIAGNOSIS — Z1231 Encounter for screening mammogram for malignant neoplasm of breast: Secondary | ICD-10-CM

## 2013-02-05 ENCOUNTER — Ambulatory Visit (HOSPITAL_COMMUNITY)
Admission: RE | Admit: 2013-02-05 | Discharge: 2013-02-05 | Disposition: A | Discharge status: Home / Self Care | Payer: BC Managed Care – PPO | Source: Ambulatory Visit | Attending: Internal Medicine | Admitting: Internal Medicine

## 2013-02-05 DIAGNOSIS — Z1231 Encounter for screening mammogram for malignant neoplasm of breast: Secondary | ICD-10-CM | POA: Insufficient documentation

## 2013-02-09 ENCOUNTER — Other Ambulatory Visit: Payer: Self-pay | Admitting: Internal Medicine

## 2013-02-09 DIAGNOSIS — R928 Other abnormal and inconclusive findings on diagnostic imaging of breast: Secondary | ICD-10-CM

## 2013-02-19 ENCOUNTER — Ambulatory Visit
Admission: RE | Admit: 2013-02-19 | Discharge: 2013-02-19 | Disposition: A | Discharge status: Home / Self Care | Payer: BC Managed Care – PPO | Source: Ambulatory Visit | Attending: Internal Medicine | Admitting: Internal Medicine

## 2013-02-19 DIAGNOSIS — R928 Other abnormal and inconclusive findings on diagnostic imaging of breast: Secondary | ICD-10-CM

## 2013-03-11 DIAGNOSIS — Z23 Encounter for immunization: Secondary | ICD-10-CM | POA: Insufficient documentation

## 2013-04-27 DIAGNOSIS — R197 Diarrhea, unspecified: Secondary | ICD-10-CM | POA: Insufficient documentation

## 2013-07-20 ENCOUNTER — Other Ambulatory Visit: Payer: Self-pay

## 2013-07-20 ENCOUNTER — Encounter: Payer: Self-pay | Admitting: Internal Medicine

## 2013-08-18 ENCOUNTER — Ambulatory Visit (INDEPENDENT_AMBULATORY_CARE_PROVIDER_SITE_OTHER): Payer: BC Managed Care – PPO | Admitting: Internal Medicine

## 2013-08-18 ENCOUNTER — Other Ambulatory Visit (INDEPENDENT_AMBULATORY_CARE_PROVIDER_SITE_OTHER): Payer: BC Managed Care – PPO

## 2013-08-18 ENCOUNTER — Encounter: Payer: Self-pay | Admitting: Internal Medicine

## 2013-08-18 VITALS — BP 110/80 | HR 72 | Ht 63.0 in | Wt 158.8 lb

## 2013-08-18 DIAGNOSIS — R197 Diarrhea, unspecified: Secondary | ICD-10-CM

## 2013-08-18 DIAGNOSIS — R1084 Generalized abdominal pain: Secondary | ICD-10-CM

## 2013-08-18 DIAGNOSIS — Z86010 Personal history of colonic polyps: Secondary | ICD-10-CM

## 2013-08-18 DIAGNOSIS — R198 Other specified symptoms and signs involving the digestive system and abdomen: Secondary | ICD-10-CM

## 2013-08-18 LAB — IGA: IgA: 368 mg/dL (ref 68–378)

## 2013-08-18 MED ORDER — METRONIDAZOLE 250 MG PO TABS: 250.0000 mg | ORAL_TABLET | Freq: Four times a day (QID) | ORAL | Status: DC

## 2013-08-18 NOTE — Patient Instructions (Addendum)
Your physician has requested that you go to the basement for the following lab work before leaving today:  TTG, IGA  We have sent the following medications to your pharmacy for you to pick up at your convenience:  Flagyl  Ok to use Imodium as needed.  Please follow up with Dr. Marina Goodell in 4 weeks

## 2013-08-18 NOTE — Progress Notes (Signed)
HISTORY OF PRESENT ILLNESS:  Julie Moran is a 57 y.o. female with hypertension, hyperlipidemia, remote cholecystectomy and post ERCP pancreatitis, and adenomatous colon polyps for which he is followed in this office. She presents today regarding change in bowel habits with persistent diarrhea. Historically, the patient had one bowel movement about every other day. In January she began to introduce concentrated vegetable and fruits in her diet. At that point, she noticed bowel movements daily. However, in March she developed acute diarrheal illness which lasted approximately one week. She self medicated with Imodium and improved. She went to [place] in April and after returning in may developed problems with diarrhea. In addition urgency with cramping and occasional episodes of incontinence. She saw her primary provider who prescribed ciprofloxacin 500 mg twice a day x7 days. She thinks this may have helped some. She has been using probiotic and rarely Imodium. Since May she describes postprandial urgency with loose stools. Occasional tolerable cramping. All relieved with defecation. No mucus or bleeding. She has had 30 pound weight loss since December, though this is said to be intentional. She is generally not awoken with diarrhea. Occasionally cannot discriminate between gas and liquid feces. She has been using protein inriched supplements daily. The formula was changed about 2 weeks ago. Since that time she does report slight decrease in frequency and improvement in the form of bowel movements. She denies new medications or antibiotic exposure. Her last complete colonoscopy was performed February 2013. Multiple adenomas removed. Followup in 3 years recommended.  REVIEW OF SYSTEMS:  All non-GI ROS negative   Past Medical History  Diagnosis Date  . Allergy   . Heart murmur   . Hyperlipidemia   . Hypertension   . Herpes simplex   . Lichen sclerosus   . Pancreatitis   . Atrophic vaginitis   .  Colon polyps     adenomatous  . Diverticulosis     Past Surgical History  Procedure Laterality Date  . Colonoscopy    . Breast lumpectomy      x5 times, both breasts  . Hysterctomy      abdominal  . Right ovary removed    . Cholecystectomy    . Upper gastrointestinal endoscopy    . Polypectomy      Social History Julie Moran  reports that she has never smoked. She has never used smokeless tobacco. She reports that she does not drink alcohol or use illicit drugs.  family history includes Breast cancer in her maternal aunt and mother; CVA in her mother and paternal grandfather; Diabetes in her father and mother; Heart disease in her father; Liver disease in her paternal grandmother; Prostate cancer in her father. There is no history of Colon cancer, Esophageal cancer, Stomach cancer, or Rectal cancer.  No Known Allergies     PHYSICAL EXAMINATION: Vital signs: BP 110/80  Pulse 72  Ht 5\' 3"  (1.6 m)  Wt 158 lb 12.8 oz (72.031 kg)  BMI 28.14 kg/m2  Constitutional: generally well-appearing, no acute distress Psychiatric: alert and oriented x3, cooperative Eyes: extraocular movements intact, anicteric, conjunctiva pink Mouth: oral pharynx moist, no lesions Neck: supple no lymphadenopathy Cardiovascular: heart regular rate and rhythm, no murmur Lungs: clear to auscultation bilaterally Abdomen: soft, nontender, nondistended, no obvious ascites, no peritoneal signs, normal bowel sounds, no organomegaly Rectal: Omitted Extremities: no lower extremity edema bilaterally Skin: no lesions on visible extremities Neuro: No focal deficits. Next  ASSESSMENT:  #1. Problems with postprandial urgency, cramping, loose stools as  described. 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 May have some relationship to protein supplements. Rule out other causes such as Giardia or celiac disease. #2. History of adenomatous colon polyps. Last examination February 2013 with multiple adenomas  PLAN:  #1.  Tissue transglutaminase antibody and serum IgA level to screen for celiac disease #2. Empiric course of metronidazole 250 mg by mouth 4 times a day x10 days #3. Okay to use Imodium #4. GI followup in 4 weeks. If still symptomatic, consider Librax

## 2013-08-19 LAB — TISSUE TRANSGLUTAMINASE, IGA: Tissue Transglutaminase Ab, IgA: 5.8 U/mL (ref <20)

## 2013-09-23 ENCOUNTER — Encounter: Payer: Self-pay | Admitting: Internal Medicine

## 2013-09-23 ENCOUNTER — Ambulatory Visit (INDEPENDENT_AMBULATORY_CARE_PROVIDER_SITE_OTHER): Payer: BC Managed Care – PPO | Admitting: Internal Medicine

## 2013-09-23 VITALS — BP 110/60 | HR 68 | Ht 63.5 in | Wt 155.0 lb

## 2013-09-23 DIAGNOSIS — K589 Irritable bowel syndrome without diarrhea: Secondary | ICD-10-CM

## 2013-09-23 DIAGNOSIS — Z86010 Personal history of colonic polyps: Secondary | ICD-10-CM

## 2013-09-23 DIAGNOSIS — R197 Diarrhea, unspecified: Secondary | ICD-10-CM

## 2013-09-23 NOTE — Patient Instructions (Signed)
Please follow up with Dr. Perry in 3 months 

## 2013-09-23 NOTE — Progress Notes (Signed)
HISTORY OF PRESENT ILLNESS:  Julie Moran is a 57 y.o. female with hypertension, hyperlipidemia, remote cholecystectomy and post ERCP pancreatitis, and adenomatous colon polyps. Patient was recently seen in the office 08/18/2013 regarding postprandial urgency with cramping and loose stools. 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 Screening for celiac disease was negative. She was treated empirically with a 10 day course of metronidazole, which she completed. She presents today for followup. Patient reports that she has improved, though incompletely. At the time of her last visit she describes her self is being 50% of baseline. Currently, 75% of baseline. For the most part, decreased frequency of loose stools. No current every other day versus to 3 times per day. She rarely uses Imodium, which helps. No new issues or problems.  REVIEW OF SYSTEMS:  All non-GI ROS negative   Past Medical History  Diagnosis Date  . Allergy   . Heart murmur   . Hyperlipidemia   . Hypertension   . Herpes simplex   . Lichen sclerosus   . Pancreatitis   . Atrophic vaginitis   . Colon polyps     adenomatous  . Diverticulosis     Past Surgical History  Procedure Laterality Date  . Colonoscopy    . Breast lumpectomy      x5 times, both breasts  . Hysterctomy      abdominal  . Right ovary removed    . Cholecystectomy    . Upper gastrointestinal endoscopy    . Polypectomy      Social History Julie Moran  reports that she has never smoked. She has never used smokeless tobacco. She reports that she does not drink alcohol or use illicit drugs.  family history includes Breast cancer in her maternal aunt and mother; CVA in her mother and paternal grandfather; Diabetes in her father and mother; Heart disease in her father; Liver disease in her paternal grandmother; Prostate cancer in her father. There is no history of Colon cancer, Esophageal cancer, Stomach cancer, or Rectal cancer.  No Known  Allergies     PHYSICAL EXAMINATION: Vital signs: BP 110/60  Pulse 68  Ht 5' 3.5" (1.613 m)  Wt 155 lb (70.308 kg)  BMI 27.02 kg/m2  SpO2 98% General: Well-developed, well-nourished, no acute distress HEENT: Sclerae are anicteric, conjunctiva pink. Oral mucosa intact Lungs: Clear Heart: Regular Abdomen: soft, nontender, nondistended, no obvious ascites, no peritoneal signs, normal bowel sounds. No organomegaly. Extremities: No edema Psychiatric: alert and oriented x3. Cooperative    ASSESSMENT:  #1. 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 Improvement since last visit may be a function of time or possibly some benefit from metronidazole #2. History of adenomatous colon polyps. Last colonoscopy February 2013. Due for followup around February 2060   PLAN:  #1. Symptomatic treatment with Imodium as needed and time #2. Routine office followup in 3 months. Contact the office in the interim as needed

## 2013-11-10 ENCOUNTER — Encounter: Payer: Self-pay | Admitting: Internal Medicine

## 2014-01-27 ENCOUNTER — Other Ambulatory Visit (HOSPITAL_COMMUNITY): Payer: Self-pay | Admitting: Internal Medicine

## 2014-01-27 DIAGNOSIS — Z1231 Encounter for screening mammogram for malignant neoplasm of breast: Secondary | ICD-10-CM

## 2014-02-09 ENCOUNTER — Ambulatory Visit (HOSPITAL_COMMUNITY): Payer: BC Managed Care – PPO

## 2014-02-10 ENCOUNTER — Ambulatory Visit (HOSPITAL_COMMUNITY)
Admission: RE | Admit: 2014-02-10 | Discharge: 2014-02-10 | Disposition: A | Discharge status: Home / Self Care | Payer: BC Managed Care – PPO | Source: Ambulatory Visit | Attending: Internal Medicine | Admitting: Internal Medicine

## 2014-02-10 DIAGNOSIS — Z1231 Encounter for screening mammogram for malignant neoplasm of breast: Secondary | ICD-10-CM | POA: Insufficient documentation

## 2014-05-19 DIAGNOSIS — D229 Melanocytic nevi, unspecified: Secondary | ICD-10-CM | POA: Insufficient documentation

## 2014-05-19 DIAGNOSIS — L720 Epidermal cyst: Secondary | ICD-10-CM | POA: Insufficient documentation

## 2014-08-19 ENCOUNTER — Encounter: Payer: Self-pay | Admitting: Internal Medicine

## 2014-09-14 ENCOUNTER — Other Ambulatory Visit: Payer: Self-pay | Admitting: Internal Medicine

## 2014-09-14 DIAGNOSIS — R945 Abnormal results of liver function studies: Secondary | ICD-10-CM

## 2014-09-21 ENCOUNTER — Ambulatory Visit
Admission: RE | Admit: 2014-09-21 | Discharge: 2014-09-21 | Disposition: A | Discharge status: Home / Self Care | Payer: BC Managed Care – PPO | Source: Ambulatory Visit | Attending: Internal Medicine | Admitting: Internal Medicine

## 2014-09-21 DIAGNOSIS — R945 Abnormal results of liver function studies: Secondary | ICD-10-CM

## 2014-09-21 MED ORDER — GADOBENATE DIMEGLUMINE 529 MG/ML IV SOLN
14.0000 mL | Freq: Once | INTRAVENOUS | Status: AC | PRN
  Administered 2014-09-21: 14 mL via INTRAVENOUS

## 2014-09-24 ENCOUNTER — Ambulatory Visit (INDEPENDENT_AMBULATORY_CARE_PROVIDER_SITE_OTHER): Payer: BC Managed Care – PPO | Admitting: Internal Medicine

## 2014-09-24 ENCOUNTER — Encounter: Payer: Self-pay | Admitting: Internal Medicine

## 2014-09-24 VITALS — BP 142/70 | HR 58 | Temp 97.4°F | Ht 63.0 in | Wt 163.4 lb

## 2014-09-24 DIAGNOSIS — J9383 Other pneumothorax: Secondary | ICD-10-CM

## 2014-09-24 DIAGNOSIS — J939 Pneumothorax, unspecified: Secondary | ICD-10-CM | POA: Insufficient documentation

## 2014-09-24 NOTE — Progress Notes (Signed)
Subjective:     Patient ID: Julie Moran, female   DOB: [birth year]  MRN: [id number]  HPI  83 yowf never smoker with reported small ptx 1999 on ? 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   09/24/2014 1st [facility] office visit/ Julie Moran   Chief Complaint  Patient presents with  . Advice Only    Referred by Dr. Philip Aspen; collapsed lung; no complaints  last airplane travel completed sept 4 without  pain p  with one episode of cp on R Ant ax line at costal margin x sev hours just  prior to the flight. No cough or wheezing  No obvious day to day or daytime variabilty or assoc chronic cough or cp or chest tightness, subjective wheeze overt sinus or hb symptoms. No unusual exp hx or h/o childhood pna/ asthma or knowledge of premature birth.  Sleeping ok without nocturnal  or early am exacerbation  of respiratory  c/o's or need for noct saba. Also denies any obvious fluctuation of symptoms with weather or environmental changes or other aggravating or alleviating factors except as outlined above   Current Medications, Allergies, Complete Past Medical History, Past Surgical History, Family History, and Social History were reviewed in [employer] record.  ROS  The following are not active complaints unless bolded sore throat, dysphagia, dental problems, itching, sneezing,  nasal congestion or excess/ purulent secretions, ear ache,   fever, chills, sweats, unintended wt loss, pleuritic or exertional cp, hemoptysis,  orthopnea pnd or leg swelling, presyncope, palpitations, heartburn, abdominal pain, anorexia, nausea, vomiting, diarrhea  or change in bowel or urinary habits, change in stools or urine, dysuria,hematuria,  rash, arthralgias, visual complaints, headache, numbness weakness or ataxia or problems with  walking or coordination,  change in mood/affect or memory.         Review of Systems     Objective:   Physical Exam  Wt Readings from Last 3 Encounters:  09/24/14 163 lb 6.4 oz (74.118 kg)  09/23/13 155 lb (70.308 kg)  08/18/13 158 lb 12.8 oz (72.031 kg)      HEENT: nl dentition, turbinates, and orophanx. Nl external ear canals without cough reflex   NECK :  without JVD/Nodes/TM/ nl carotid upstrokes bilaterally   LUNGS: no acc muscle use, slt decrease bs on R without cough on insp or exp maneuvers   CV:  RRR  no s3 or murmur or increase in P2, no edema   ABD:  soft and nontender with nl excursion in the supine position. No bruits or organomegaly, bowel sounds nl  MS:  warm without deformities, calf tenderness, cyanosis or clubbing  SKIN: warm and dry without lesions    NEURO:  alert, approp, no deficits        Assessment:

## 2014-09-24 NOTE — Patient Instructions (Addendum)
No air travel for now  Moderate exercise low impact is fine  Please schedule a follow up office visit in 2 weeks, sooner if needed  With cxr on return and bring the one from [place].  Add alpha one AT levels next ov

## 2014-09-25 NOTE — Assessment & Plan Note (Addendum)
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   If resolves then needs pfts to look for airflow obst/ alpha one def  that would risk future events bilaterally

## 2014-10-07 ENCOUNTER — Ambulatory Visit (INDEPENDENT_AMBULATORY_CARE_PROVIDER_SITE_OTHER)
Admission: RE | Admit: 2014-10-07 | Discharge: 2014-10-07 | Disposition: A | Discharge status: Home / Self Care | Payer: BC Managed Care – PPO | Source: Ambulatory Visit | Attending: Internal Medicine | Admitting: Internal Medicine

## 2014-10-07 ENCOUNTER — Encounter: Payer: Self-pay | Admitting: Internal Medicine

## 2014-10-07 ENCOUNTER — Ambulatory Visit (INDEPENDENT_AMBULATORY_CARE_PROVIDER_SITE_OTHER): Payer: BC Managed Care – PPO | Admitting: Internal Medicine

## 2014-10-07 VITALS — BP 124/70 | HR 60 | Ht 63.5 in | Wt 160.0 lb

## 2014-10-07 DIAGNOSIS — J9383 Other pneumothorax: Secondary | ICD-10-CM

## 2014-10-07 NOTE — Patient Instructions (Signed)
Please see patient coordinator before you leave today  to schedule high resolution CT chest in 6 weeks and return here same day

## 2014-10-07 NOTE — Progress Notes (Signed)
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      09/24/2014 1st [facility] office visit/ Wert   Chief Complaint  Patient presents with  . Advice Only    Referred by Dr. Philip Aspen; collapsed lung; no complaints  last airplane travel completed sept 4 without  pain p  with one episode of cp on R Ant ax line at costal margin x sev days   prior to the flight x 2 hours. No cough or wheezing rec No change rx   10/07/2014 f/u ov/Wert re: f/u small R ptx Chief Complaint  Patient presents with  . Follow-up    CXR was done today. Pt states that she feels well and denies any co's today.   did note poor aerobic tolerance x 3 d prior to [place]  But admits may just be out of shape and really hasn't had a good baseline aerobic routine to compare to.  No obvious day to day or daytime variabilty or assoc chronic cough or cp or chest tightness, subjective wheeze overt sinus or hb symptoms. No unusual exp hx or h/o childhood pna/ asthma or knowledge of premature birth.  Sleeping ok without nocturnal  or early am exacerbation  of respiratory  c/o's or need for noct saba. Also denies any obvious fluctuation of symptoms with weather or environmental changes or other aggravating or alleviating factors except as outlined above   Current Medications, Allergies, Complete Past Medical History, Past Surgical History, Family History, and Social History were reviewed in [employer] record.  ROS  The following are not active complaints unless bolded sore throat, dysphagia, dental problems, itching, sneezing,  nasal congestion or excess/ purulent  secretions, ear ache,   fever, chills, sweats, unintended wt loss, pleuritic or exertional cp, hemoptysis,  orthopnea pnd or leg swelling, presyncope, palpitations, heartburn, abdominal pain, anorexia, nausea, vomiting, diarrhea  or change in bowel or urinary habits, change in stools or urine, dysuria,hematuria,  rash, arthralgias, visual complaints, headache, numbness weakness or ataxia or problems with walking or coordination,  change in mood/affect or memory.         Review of Systems     Objective:   Physical Exam  10/07/14        160  Wt Readings from Last 3 Encounters:  09/24/14 163 lb 6.4 oz (74.118 kg)  09/23/13 155 lb (70.308 kg)  08/18/13 158 lb 12.8 oz (72.031 kg)      HEENT: nl dentition, turbinates, and orophanx. Nl external ear canals without cough reflex   NECK :  without JVD/Nodes/TM/ nl carotid upstrokes bilaterally   LUNGS: no acc muscle use, slt decrease bs on R without cough on insp or exp maneuvers   CV:  RRR  no s3 or murmur or increase in P2, no edema   ABD:  soft and nontender with nl excursion in the supine position. No bruits or organomegaly, bowel sounds nl  MS:  warm without deformities, calf tenderness, cyanosis or clubbing  SKIN: warm and dry without lesions       cxr 10/07/14  1. Small  right basilar pneumothorax of 10-20 percent.  2. Bibasilar opacity likely reflects atelectasis.  3. Small right pleural effusion.  4. 6 mm nodular density in the left mid lung. This is not well seen  on the prior studies.      Assessment:

## 2014-10-08 NOTE — Assessment & Plan Note (Addendum)
-   Assoc with [name] syndrome - clinically probably occurred early sept 2015 and improved on f/u cxr 10/07/14  - HRCT rec for around 10/24/14 >>>  Discussed in detail all the  indications, usual  risks and alternatives  relative to the benefits with patient who agrees to proceed with conservative f/u with HRCT in 2 weeks, no air travel in meantime, call sooner if cp or sob

## 2014-10-27 ENCOUNTER — Encounter: Payer: Self-pay | Admitting: Internal Medicine

## 2014-11-02 ENCOUNTER — Encounter: Payer: Self-pay | Admitting: Internal Medicine

## 2014-11-12 ENCOUNTER — Ambulatory Visit (INDEPENDENT_AMBULATORY_CARE_PROVIDER_SITE_OTHER): Payer: BC Managed Care – PPO | Admitting: Internal Medicine

## 2014-11-12 ENCOUNTER — Ambulatory Visit (INDEPENDENT_AMBULATORY_CARE_PROVIDER_SITE_OTHER)
Admission: RE | Admit: 2014-11-12 | Discharge: 2014-11-12 | Disposition: A | Discharge status: Home / Self Care | Payer: BC Managed Care – PPO | Source: Ambulatory Visit | Attending: Internal Medicine | Admitting: Internal Medicine

## 2014-11-12 ENCOUNTER — Encounter: Payer: Self-pay | Admitting: Internal Medicine

## 2014-11-12 VITALS — BP 118/80 | HR 60 | Ht 63.0 in | Wt 164.0 lb

## 2014-11-12 DIAGNOSIS — Z23 Encounter for immunization: Secondary | ICD-10-CM

## 2014-11-12 DIAGNOSIS — J9383 Other pneumothorax: Secondary | ICD-10-CM

## 2014-11-12 NOTE — Patient Instructions (Signed)
Best cough medication is either mucinex or mucinex dm and avoid air travel with any resp illness  Pneumovax today and prevnar at age 58  Yearly cxr is a good idea and as needed for any cough/ chest pain/ shortness of breath

## 2014-11-12 NOTE — Progress Notes (Signed)
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 [facility]  healed with 02 no chest tube but looking for kidney tumors at [facility] imaging due to genetic risk and found to have R ptx Sep 21 2014 so referred to pulmonary clinic 09/24/2014  By Dr Philip Aspen.     History of Present Illness  09/24/2014 1st [facility] office visit/ Leonid Manus   Chief Complaint  Patient presents with  . Advice Only    Referred by Dr. Philip Aspen; collapsed lung; no complaints  last airplane travel completed sept 4 without  pain p  with one episode of cp on R Ant ax line at costal margin x sev days   prior to the flight x 2 hours. No cough or wheezing rec No change rx    10/07/2014 f/u ov/Alfonso Shackett re: f/u small R ptx Chief Complaint  Patient presents with  . Follow-up    CXR was done today. Pt states that she feels well and denies any co's today.   did note poor aerobic tolerance x 3 d prior to [place]  But admits may just be out of shape and really hasn't had a good baseline aerobic routine to compare to. rec HRCT    11/12/2014 f/u ov/Lakira Ogando re: recurrent ptx never required chest tube Chief Complaint  Patient presents with  . Follow-up    Pt had CT Chest this am. She is feeling well and denies any co's today.   Not limited by breathing from desired activities      No obvious day to day or daytime variabilty or assoc chronic cough or cp or chest tightness, subjective wheeze overt sinus or hb symptoms. No unusual exp hx or h/o childhood pna/ asthma or knowledge of premature birth.  Sleeping ok without nocturnal  or early am exacerbation  of respiratory  c/o's or need for noct saba. Also denies any obvious fluctuation of symptoms with weather or environmental changes or other aggravating or alleviating factors except as outlined above   Current Medications,  Allergies, Complete Past Medical History, Past Surgical History, Family History, and Social History were reviewed in [employer] record.  ROS  The following are not active complaints unless bolded sore throat, dysphagia, dental problems, itching, sneezing,  nasal congestion or excess/ purulent secretions, ear ache,   fever, chills, sweats, unintended wt loss, pleuritic or exertional cp, hemoptysis,  orthopnea pnd or leg swelling, presyncope, palpitations, heartburn, abdominal pain, anorexia, nausea, vomiting, diarrhea  or change in bowel or urinary habits, change in stools or urine, dysuria,hematuria,  rash, arthralgias, visual complaints, headache, numbness weakness or ataxia or problems with walking or coordination,  change in mood/affect or memory.               Objective:   Physical Exam  10/07/14        160 >   11/12/2014 164 Wt Readings from Last 3 Encounters:  09/24/14 163 lb 6.4 oz (74.118 kg)  09/23/13 155 lb (70.308 kg)  08/18/13 158 lb 12.8 oz (72.031 kg)      HEENT: nl dentition, turbinates, and orophanx. Nl external ear canals without cough reflex   NECK :  without JVD/Nodes/TM/ nl carotid upstrokes bilaterally   LUNGS: no acc muscle use, clear to A and P  without cough on insp or  exp maneuvers   CV:  RRR  no s3 or murmur or increase in P2, no edema   ABD:  soft and nontender with nl excursion in the supine position. No bruits or organomegaly, bowel sounds nl  MS:  warm without deformities, calf tenderness, cyanosis or clubbing  SKIN: warm and dry without lesions       cxr 10/07/14  1. Small right basilar pneumothorax of 10-20 percent.  2. Bibasilar opacity likely reflects atelectasis.  3. Small right pleural effusion.  4. 6 mm nodular density in the left mid lung. This is not well seen  on the prior studies.   HRCT 11/12/14  1. Basilar predominant pulmonary cysts, consistent with the given history of Birt-Hogg-Dub syndrome. No  pneumothorax. 2. Favor rounded atelectasis in the right middle lobe.     Assessment:

## 2014-11-13 NOTE — Assessment & Plan Note (Signed)
-   initial dx 03/01/1998 [facility]  On R - Assoc with [name] syndrome - clinically probably occurred on R early sept 2015 by symptom hx  and improved on f/u cxr 10/07/14  - HRCT 11/12/14 1. Basilar predominant pulmonary cysts, consistent with the given history of Birt-Hogg-Dub syndrome. No pneumothorax. 2. Favor rounded atelectasis in the right middle lobe.  In retrospect question whether this was really a PTX or just a cyst that was being viz as MRI not a good way to look at lung tissue.  In any case, no ptx now but def risk of future events, advised re rx for cough/ minimizing un-necessary air travel and obviously scuba diving  Nothing ele to offer at this point except rec for yearly cxr inside the system for apples to apples comparison    See instructions for specific recommendations which were reviewed directly with the patient who was given a copy with highlighter outlining the key components.

## 2015-01-17 ENCOUNTER — Other Ambulatory Visit (HOSPITAL_COMMUNITY): Payer: Self-pay | Admitting: Internal Medicine

## 2015-01-17 DIAGNOSIS — Z1231 Encounter for screening mammogram for malignant neoplasm of breast: Secondary | ICD-10-CM

## 2015-01-31 ENCOUNTER — Encounter: Payer: Self-pay | Admitting: Internal Medicine

## 2015-02-14 ENCOUNTER — Encounter: Payer: Self-pay | Admitting: Internal Medicine

## 2015-02-14 ENCOUNTER — Other Ambulatory Visit (HOSPITAL_COMMUNITY): Payer: Self-pay | Admitting: Internal Medicine

## 2015-02-14 ENCOUNTER — Ambulatory Visit (HOSPITAL_COMMUNITY): Payer: BC Managed Care – PPO

## 2015-02-14 ENCOUNTER — Ambulatory Visit (HOSPITAL_COMMUNITY)
Admission: RE | Admit: 2015-02-14 | Discharge: 2015-02-14 | Disposition: A | Discharge status: Home / Self Care | Payer: BC Managed Care – PPO | Source: Ambulatory Visit | Attending: Internal Medicine | Admitting: Internal Medicine

## 2015-02-14 DIAGNOSIS — Z1231 Encounter for screening mammogram for malignant neoplasm of breast: Secondary | ICD-10-CM

## 2015-03-25 ENCOUNTER — Ambulatory Visit (AMBULATORY_SURGERY_CENTER): Payer: Self-pay | Admitting: *Deleted

## 2015-03-25 VITALS — Ht 63.75 in | Wt 155.4 lb

## 2015-03-25 DIAGNOSIS — Z86010 Personal history of colonic polyps: Secondary | ICD-10-CM

## 2015-03-25 MED ORDER — MOVIPREP 100 G PO SOLR: 1.0000 | Freq: Once | ORAL | Status: DC

## 2015-03-25 NOTE — Progress Notes (Signed)
No egg or soy allergy No diet pills No home 02 use No issues with past sedation emmi declined

## 2015-03-28 ENCOUNTER — Other Ambulatory Visit: Payer: Self-pay | Admitting: Internal Medicine

## 2015-03-28 DIAGNOSIS — Z0289 Encounter for other administrative examinations: Secondary | ICD-10-CM

## 2015-04-04 ENCOUNTER — Other Ambulatory Visit: Payer: Self-pay | Admitting: Internal Medicine

## 2015-04-04 ENCOUNTER — Ambulatory Visit
Admission: RE | Admit: 2015-04-04 | Discharge: 2015-04-04 | Disposition: A | Discharge status: Home / Self Care | Payer: BC Managed Care – PPO | Source: Ambulatory Visit | Attending: Internal Medicine | Admitting: Internal Medicine

## 2015-04-04 DIAGNOSIS — Z0289 Encounter for other administrative examinations: Secondary | ICD-10-CM

## 2015-04-08 ENCOUNTER — Encounter: Payer: Self-pay | Admitting: Internal Medicine

## 2015-04-08 ENCOUNTER — Ambulatory Visit (AMBULATORY_SURGERY_CENTER): Payer: BC Managed Care – PPO | Admitting: Internal Medicine

## 2015-04-08 VITALS — BP 127/69 | HR 58 | Temp 96.8°F | Resp 20 | Ht 63.75 in | Wt 155.0 lb

## 2015-04-08 DIAGNOSIS — Z86010 Personal history of colonic polyps: Secondary | ICD-10-CM

## 2015-04-08 MED ORDER — SODIUM CHLORIDE 0.9 % IV SOLN: 500.0000 mL | INTRAVENOUS | Status: DC

## 2015-04-08 NOTE — Op Note (Signed)
[facility]  [address] [place], [address]   COLONOSCOPY PROCEDURE REPORT  PATIENT: Julie Moran, Julie Moran  MR#: [id number] BIRTHDATE: 1956-10-24 , 58  yrs. old GENDER: female ENDOSCOPIST: Eustace Quail, MD REFERRED AX:[id number] Program Recall PROCEDURE DATE:  04/08/2015 PROCEDURE:   Colonoscopy, surveillance First Screening Colonoscopy - Avg.  risk and is 50 yrs.  old or older - No.  Prior Negative Screening - Now for repeat screening. N/A  History of Adenoma - Now for follow-up colonoscopy & has been > or = to 3 yrs.  Yes hx of adenoma.  Has been 3 or more years since last colonoscopy. ASA CLASS:   Class II INDICATIONS:Surveillance due to prior colonic neoplasia and PH Colon Adenoma.. Index exam 2008 with small tubular adenoma. Follow-up 2013 with 4 sessile serrated polyps MEDICATIONS: Monitored anesthesia care and Propofol 200 mg IV  DESCRIPTION OF PROCEDURE:   After the risks benefits and alternatives of the procedure were thoroughly explained, informed consent was obtained.  The digital rectal exam revealed no abnormalities of the rectum.   The LB [id number]  endoscope was introduced through the anus and advanced to the cecum, which was identified by both the appendix and ileocecal valve. No adverse events experienced.   The quality of the prep was excellent. (MoviPrep was used)  The instrument was then slowly withdrawn as the colon was fully examined.    COLON FINDINGS: The examined terminal ileum appeared to be normal. A normal appearing cecum, ileocecal valve, and appendiceal orifice were identified.  The ascending, transverse, descending, sigmoid colon, and rectum appeared unremarkable.  Retroflexed views revealed no abnormalities. The time to cecum = 4.1 Withdrawal time = 11.6   The scope was withdrawn and the procedure completed. COMPLICATIONS: There were no immediate complications.  ENDOSCOPIC IMPRESSION: 1.   The examined terminal  ileum appeared to be normal 2.   Normal colonoscopy  RECOMMENDATIONS: 1. Follow up colonoscopy in 5 years  eSigned:  Eustace Quail, MD 04/08/2015 10:48 AM   cc: Leanna Battles, MD and The Patient

## 2015-04-08 NOTE — Progress Notes (Signed)
Report to PACU, RN, vss, BBS= Clear.  

## 2015-04-08 NOTE — Patient Instructions (Signed)
YOU HAD AN ENDOSCOPIC PROCEDURE TODAY AT [facility]:   Refer to the procedure report that was given to you for any specific questions about what was found during the examination.  If the procedure report does not answer your questions, please call your gastroenterologist to clarify.  If you requested that your care partner not be given the details of your procedure findings, then the procedure report has been included in a sealed envelope for you to review at your convenience later.  YOU SHOULD EXPECT: Some feelings of bloating in the abdomen. Passage of more gas than usual.  Walking can help get rid of the air that was put into your GI tract during the procedure and reduce the bloating. If you had a lower endoscopy (such as a colonoscopy or flexible sigmoidoscopy) you may notice spotting of blood in your stool or on the toilet paper. If you underwent a bowel prep for your procedure, you may not have a normal bowel movement for a few days.  Please Note:  You might notice some irritation and congestion in your nose or some drainage.  This is from the oxygen used during your procedure.  There is no need for concern and it should clear up in a day or so.  SYMPTOMS TO REPORT IMMEDIATELY:   Following lower endoscopy (colonoscopy or flexible sigmoidoscopy):  Excessive amounts of blood in the stool  Significant tenderness or worsening of abdominal pains  Swelling of the abdomen that is new, acute  Fever of 100F or higher   For urgent or emergent issues, a gastroenterologist can be reached at any hour by calling [phone].   DIET: Your first meal following the procedure should be a small meal and then it is ok to progress to your normal diet. Heavy or fried foods are harder to digest and may make you feel nauseous or bloated.  Likewise, meals heavy in dairy and vegetables can increase bloating.  Drink plenty of fluids but you should avoid alcoholic beverages for 24  hours.  ACTIVITY:  You should plan to take it easy for the rest of today and you should NOT DRIVE or use heavy machinery until tomorrow (because of the sedation medicines used during the test).    FOLLOW UP: Our staff will call the number listed on your records the next business day following your procedure to check on you and address any questions or concerns that you may have regarding the information given to you following your procedure. If we do not reach you, we will leave a message.  However, if you are feeling well and you are not experiencing any problems, there is no need to return our call.  We will assume that you have returned to your regular daily activities without incident.  If any biopsies were taken you will be contacted by phone or by letter within the next 1-3 weeks.  Please call us at [phone] if you have not heard about the biopsies in 3 weeks.    SIGNATURES/CONFIDENTIALITY: You and/or your care partner have signed paperwork which will be entered into your electronic medical record.  These signatures attest to the fact that that the information above on your After Visit Summary has been reviewed and is understood.  Full responsibility of the confidentiality of this discharge information lies with you and/or your care-partner.  Repeat colonoscopy in 5 years

## 2015-04-11 ENCOUNTER — Telehealth: Payer: Self-pay

## 2015-04-11 ENCOUNTER — Encounter: Payer: Self-pay | Admitting: *Deleted

## 2015-04-11 NOTE — Telephone Encounter (Signed)
Erroneous encounter

## 2015-04-11 NOTE — Telephone Encounter (Signed)
  Follow up Call-  Call back number 04/08/2015  Post procedure Call Back phone  # [phone]  Permission to leave phone message Yes     Patient questions:  Do you have a fever, pain , or abdominal swelling? No. Pain Score  0 *  Have you tolerated food without any problems? Yes.    Have you been able to return to your normal activities? Yes.    Do you have any questions about your discharge instructions: Diet   No. Medications  No. Follow up visit  No.  Do you have questions or concerns about your Care? No.  Actions: * If pain score is 4 or above: No action needed, pain <4.

## 2015-10-31 ENCOUNTER — Telehealth: Payer: Self-pay | Admitting: Internal Medicine

## 2015-10-31 NOTE — Telephone Encounter (Signed)
Per 11/12/14 OV: Patient Instructions        Yearly cxr is a good idea and as needed for any cough/ chest pain/ shortness of breath  --  Pt calling to have her CXR ordered. Does pt needs OV as well Dr. Melvyn Novas? thanks

## 2015-10-31 NOTE — Telephone Encounter (Signed)
Called spoke with pt. She scheduled appt to see MW. Nothing further needed

## 2015-10-31 NOTE — Telephone Encounter (Signed)
i was assuming she would have that in the context of a routine yearly check up by primary care but if she wants me to do this just focusing on the lung issue that's fine > ov with cxr/ no rush

## 2015-11-28 ENCOUNTER — Ambulatory Visit (INDEPENDENT_AMBULATORY_CARE_PROVIDER_SITE_OTHER): Payer: BC Managed Care – PPO | Admitting: Internal Medicine

## 2015-11-28 ENCOUNTER — Encounter: Payer: Self-pay | Admitting: Internal Medicine

## 2015-11-28 ENCOUNTER — Ambulatory Visit (INDEPENDENT_AMBULATORY_CARE_PROVIDER_SITE_OTHER)
Admission: RE | Admit: 2015-11-28 | Discharge: 2015-11-28 | Disposition: A | Discharge status: Home / Self Care | Payer: BC Managed Care – PPO | Source: Ambulatory Visit | Attending: Internal Medicine | Admitting: Internal Medicine

## 2015-11-28 VITALS — BP 114/82 | HR 67 | Ht 63.0 in | Wt 167.0 lb

## 2015-11-28 DIAGNOSIS — J9383 Other pneumothorax: Secondary | ICD-10-CM | POA: Diagnosis not present

## 2015-11-28 DIAGNOSIS — Z23 Encounter for immunization: Secondary | ICD-10-CM | POA: Diagnosis not present

## 2015-11-28 NOTE — Patient Instructions (Signed)
No need for follow up xray unless decline exercise tolerance or pain with deep breath

## 2015-11-28 NOTE — Progress Notes (Signed)
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 [facility] [place]  healed with 02 no chest tube but looking for kidney tumors at [facility] imaging due to genetic risk and found to have R ptx Sep 21 2014 so referred to pulmonary clinic 09/24/2014  By Dr Philip Aspen.     History of Present Illness  09/24/2014 1st [facility] office visit/ Julie Moran   Chief Complaint  Patient presents with  . Advice Only    Referred by Dr. Philip Aspen; collapsed lung; no complaints  last airplane travel completed sept 4 without  pain p  with one episode of cp on R Ant ax line at costal margin x sev days   prior to the flight x 2 hours. No cough or wheezing rec No change rx    10/07/2014 f/u ov/Julie Moran re: f/u small R ptx Chief Complaint  Patient presents with  . Follow-up    CXR was done today. Pt states that she feels well and denies any co's today.   did note poor aerobic tolerance x 3 d prior to [place]  But admits may just be out of shape and really hasn't had a good baseline aerobic routine to compare to. rec HRCT    11/12/2014 f/u ov/Julie Moran re: recurrent ptx never required chest tube Chief Complaint  Patient presents with  . Follow-up    Pt had CT Chest this am. She is feeling well and denies any co's today.   rec Best cough medication is either mucinex or mucinex dm and avoid air travel with any resp illness Pneumovax today and prevnar in a year      11/28/2015  f/u ov/Julie Moran re:  Chief Complaint  Patient presents with  . Follow-up    CXR done today. Pt doing well and denies any co's today.   Not limited by breathing from desired activities    No obvious day to day or daytime variabilty or assoc chronic cough or cp or chest tightness, subjective wheeze overt sinus or hb symptoms. No unusual exp hx or h/o childhood pna/ asthma or knowledge of premature  birth.  Sleeping ok without nocturnal  or early am exacerbation  of respiratory  c/o's or need for noct saba. Also denies any obvious fluctuation of symptoms with weather or environmental changes or other aggravating or alleviating factors except as outlined above   Current Medications, Allergies, Complete Past Medical History, Past Surgical History, Family History, and Social History were reviewed in [employer] record.  ROS  The following are not active complaints unless bolded sore throat, dysphagia, dental problems, itching, sneezing,  nasal congestion or excess/ purulent secretions, ear ache,   fever, chills, sweats, unintended wt loss, pleuritic or exertional cp, hemoptysis,  orthopnea pnd or leg swelling, presyncope, palpitations, heartburn, abdominal pain, anorexia, nausea, vomiting, diarrhea  or change in bowel or urinary habits, change in stools or urine, dysuria,hematuria,  rash, arthralgias, visual complaints, headache, numbness weakness or ataxia or problems with walking or coordination,  change in mood/affect or memory.               Objective:   Physical Exam  10/07/14        160 >   11/12/2014 164 > 11/28/2015  167  Wt Readings from Last 3 Encounters:  09/24/14 163 lb 6.4  oz (74.118 kg)  09/23/13 155 lb (70.308 kg)  08/18/13 158 lb 12.8 oz (72.031 kg)      HEENT: nl dentition, turbinates, and orophanx. Nl external ear canals without cough reflex   NECK :  without JVD/Nodes/TM/ nl carotid upstrokes bilaterally   LUNGS: no acc muscle use, clear to A and P  without cough on insp or exp maneuvers   CV:  RRR  no s3 or murmur or increase in P2, no edema   ABD:  soft and nontender with nl excursion in the supine position. No bruits or organomegaly, bowel sounds nl  MS:  warm without deformities, calf tenderness, cyanosis or clubbing  SKIN: warm and dry without lesions      CXR PA and Lateral:   11/28/2015 :    I personally reviewed images  and agree with radiology impression as follows:    Lower lobe predominant pulmonary cysts, as detailed on 11/12/2014 CT. Please see that report.  No large pneumothorax identified. Lucency within the inferior lateral subpleural right hemi thorax is favored to be secondary to subpleural cysts.           Assessment:

## 2015-11-30 NOTE — Assessment & Plan Note (Signed)
-   initial dx 03/01/1998 [facility]  On R - Assoc with [name] syndrome - clinically probably occurred on R early sept 2015 by symptom hx  and improved on f/u cxr 10/07/14  - HRCT 11/12/14 1. Basilar predominant pulmonary cysts, consistent with the given history of Birt-Hogg-Dub syndrome. No pneumothorax. 2. Favor rounded atelectasis in the right middle lobe.   I had an extended final summary discussion with the patient reviewing all relevant studies completed to date and  lasting 10   minutes of a 15 minute visit on the following issues:   There is no evidence of recurrence although she is at risk of pneumothorax. I have rethought my recommendations for routine chest x-rays here and simply recommend follow-up visits as needed. The worst possible scenario would for her to have an occult pneumothorax and then get on an airplane but even if we did a chest x-ray yearly it would be an unlikely to prevent this scenario  unless she happened schedule her vacation immediately after the chest x-ray which seems impractical to me. I did recommend that she go ahead and get the full Pneumovax treatment today which would include a Prevnar 13 dose and she agreed to.   F/u is prn

## 2016-01-18 ENCOUNTER — Other Ambulatory Visit: Payer: Self-pay

## 2016-01-18 DIAGNOSIS — Z1231 Encounter for screening mammogram for malignant neoplasm of breast: Secondary | ICD-10-CM

## 2016-02-14 ENCOUNTER — Other Ambulatory Visit: Payer: Self-pay | Admitting: Internal Medicine

## 2016-02-14 DIAGNOSIS — R7401 Elevation of levels of liver transaminase levels: Secondary | ICD-10-CM

## 2016-02-14 DIAGNOSIS — R740 Nonspecific elevation of levels of transaminase and lactic acid dehydrogenase [LDH]: Principal | ICD-10-CM

## 2016-02-16 ENCOUNTER — Ambulatory Visit
Admission: RE | Admit: 2016-02-16 | Discharge: 2016-02-16 | Disposition: A | Discharge status: Home / Self Care | Payer: BC Managed Care – PPO | Source: Ambulatory Visit

## 2016-02-16 DIAGNOSIS — Z1231 Encounter for screening mammogram for malignant neoplasm of breast: Secondary | ICD-10-CM

## 2016-02-22 ENCOUNTER — Inpatient Hospital Stay: Admission: RE | Admit: 2016-02-22 | Payer: BC Managed Care – PPO | Source: Ambulatory Visit

## 2016-03-30 ENCOUNTER — Other Ambulatory Visit: Payer: Self-pay | Admitting: Internal Medicine

## 2016-03-30 DIAGNOSIS — N281 Cyst of kidney, acquired: Secondary | ICD-10-CM

## 2016-04-05 ENCOUNTER — Ambulatory Visit
Admission: RE | Admit: 2016-04-05 | Discharge: 2016-04-05 | Disposition: A | Discharge status: Home / Self Care | Payer: BC Managed Care – PPO | Source: Ambulatory Visit | Attending: Internal Medicine | Admitting: Internal Medicine

## 2016-04-05 DIAGNOSIS — N281 Cyst of kidney, acquired: Secondary | ICD-10-CM

## 2016-08-10 DIAGNOSIS — E669 Obesity, unspecified: Secondary | ICD-10-CM | POA: Insufficient documentation

## 2016-08-10 DIAGNOSIS — R39198 Other difficulties with micturition: Secondary | ICD-10-CM | POA: Insufficient documentation

## 2016-10-10 ENCOUNTER — Other Ambulatory Visit: Payer: Self-pay | Admitting: Internal Medicine

## 2016-10-10 DIAGNOSIS — N281 Cyst of kidney, acquired: Secondary | ICD-10-CM

## 2016-10-12 ENCOUNTER — Ambulatory Visit
Admission: RE | Admit: 2016-10-12 | Discharge: 2016-10-12 | Disposition: A | Discharge status: Home / Self Care | Payer: BC Managed Care – PPO | Source: Ambulatory Visit | Attending: Internal Medicine | Admitting: Internal Medicine

## 2016-10-12 DIAGNOSIS — N281 Cyst of kidney, acquired: Secondary | ICD-10-CM

## 2017-03-25 ENCOUNTER — Other Ambulatory Visit: Payer: Self-pay | Admitting: Internal Medicine

## 2017-03-25 DIAGNOSIS — Z1231 Encounter for screening mammogram for malignant neoplasm of breast: Secondary | ICD-10-CM

## 2017-04-01 LAB — HM HEPATITIS C SCREENING LAB: HM Hepatitis Screen: NEGATIVE

## 2017-04-04 ENCOUNTER — Other Ambulatory Visit: Payer: Self-pay | Admitting: Internal Medicine

## 2017-04-04 ENCOUNTER — Ambulatory Visit
Admission: RE | Admit: 2017-04-04 | Discharge: 2017-04-04 | Disposition: A | Discharge status: Home / Self Care | Payer: BC Managed Care – PPO | Source: Ambulatory Visit | Attending: Internal Medicine | Admitting: Internal Medicine

## 2017-04-04 DIAGNOSIS — R2242 Localized swelling, mass and lump, left lower limb: Secondary | ICD-10-CM

## 2017-04-04 DIAGNOSIS — M79605 Pain in left leg: Secondary | ICD-10-CM

## 2017-04-04 DIAGNOSIS — M7989 Other specified soft tissue disorders: Secondary | ICD-10-CM

## 2017-04-17 ENCOUNTER — Ambulatory Visit
Admission: RE | Admit: 2017-04-17 | Discharge: 2017-04-17 | Disposition: A | Discharge status: Home / Self Care | Payer: BC Managed Care – PPO | Source: Ambulatory Visit | Attending: Internal Medicine | Admitting: Internal Medicine

## 2017-04-17 DIAGNOSIS — Z1231 Encounter for screening mammogram for malignant neoplasm of breast: Secondary | ICD-10-CM

## 2017-09-17 ENCOUNTER — Ambulatory Visit (INDEPENDENT_AMBULATORY_CARE_PROVIDER_SITE_OTHER): Payer: BC Managed Care – PPO | Admitting: Podiatry

## 2017-09-17 ENCOUNTER — Ambulatory Visit (INDEPENDENT_AMBULATORY_CARE_PROVIDER_SITE_OTHER): Payer: BC Managed Care – PPO

## 2017-09-17 ENCOUNTER — Encounter: Payer: Self-pay | Admitting: Podiatry

## 2017-09-17 VITALS — BP 128/74 | HR 62 | Resp 16

## 2017-09-17 DIAGNOSIS — S90121A Contusion of right lesser toe(s) without damage to nail, initial encounter: Secondary | ICD-10-CM

## 2017-09-17 DIAGNOSIS — Z803 Family history of malignant neoplasm of breast: Secondary | ICD-10-CM | POA: Insufficient documentation

## 2017-09-17 DIAGNOSIS — E785 Hyperlipidemia, unspecified: Secondary | ICD-10-CM | POA: Insufficient documentation

## 2017-09-17 DIAGNOSIS — Q999 Chromosomal abnormality, unspecified: Secondary | ICD-10-CM | POA: Insufficient documentation

## 2017-09-17 DIAGNOSIS — N904 Leukoplakia of vulva: Secondary | ICD-10-CM | POA: Insufficient documentation

## 2017-09-17 DIAGNOSIS — S92501A Displaced unspecified fracture of right lesser toe(s), initial encounter for closed fracture: Secondary | ICD-10-CM | POA: Diagnosis not present

## 2017-09-17 DIAGNOSIS — IMO0002 Reserved for concepts with insufficient information to code with codable children: Secondary | ICD-10-CM | POA: Insufficient documentation

## 2017-09-17 DIAGNOSIS — I10 Essential (primary) hypertension: Secondary | ICD-10-CM | POA: Insufficient documentation

## 2017-09-17 DIAGNOSIS — N952 Postmenopausal atrophic vaginitis: Secondary | ICD-10-CM | POA: Insufficient documentation

## 2017-09-17 DIAGNOSIS — J019 Acute sinusitis, unspecified: Secondary | ICD-10-CM | POA: Insufficient documentation

## 2017-09-17 DIAGNOSIS — B009 Herpesviral infection, unspecified: Secondary | ICD-10-CM | POA: Insufficient documentation

## 2017-09-17 DIAGNOSIS — R6882 Decreased libido: Secondary | ICD-10-CM | POA: Insufficient documentation

## 2017-09-17 NOTE — Progress Notes (Signed)
Subjective:  Patient ID: Julie Moran, female    DOB: [birth year],  MRN: [id number] HPI Chief Complaint  Patient presents with  . Toe Injury    5th toe right - stumped toe x 3weeks ago, foot bruised and got swollen, PCP xrayed-said no fracture, still swollen and sore    61 y.o. female presents with the above complaint.    Past Medical History:  Diagnosis Date  . Allergy   . Atrophic vaginitis   . Birt-Hogg-Dube syndrome    lung cysts   . Colon polyps    adenomatous  . Diverticulosis   . Heart murmur   . Herpes simplex   . Hyperlipidemia   . Hypertension   . Lichen sclerosus   . Pancreatitis   . Pneumothorax, right    spontaneous   Past Surgical History:  Procedure Laterality Date  . BREAST BIOPSY  03/29/2006  . BREAST BIOPSY Bilateral 06/14/1998  . BREAST EXCISIONAL BIOPSY Bilateral 1999  . BREAST LUMPECTOMY  [phone]   x5 times total , both breasts  . CHOLECYSTECTOMY    . COLONOSCOPY    . hysterctomy     abdominal  . POLYPECTOMY    . right ovary removed    . UPPER GASTROINTESTINAL ENDOSCOPY      Current Outpatient Prescriptions:  .  AFLURIA QUADRIVALENT 0.5 ML injection, TO BE ADMINISTERED BY PHARMACIST FOR IMMUNIZATION, Disp: , Rfl: 0 .  amLODipine (NORVASC) 5 MG tablet, Take 1 tablet by mouth Daily. , Disp: , Rfl:  .  buPROPion (WELLBUTRIN SR) 150 MG 12 hr tablet, , Disp: , Rfl:  .  Nutritional Supplements (JUICE PLUS FIBRE PO), Take 2 tablets by mouth 3 (three) times daily., Disp: , Rfl:  .  pravastatin (PRAVACHOL) 20 MG tablet, , Disp: , Rfl:  .  SHINGRIX injection, TO BE ADMINISTERED BY PHARMACIST FOR IMMUNIZATION, Disp: , Rfl: 1  Allergies  Allergen Reactions  . No Known Allergies    Review of Systems  All other systems reviewed and are negative.  Objective:   Vitals:   09/17/17 0858  BP: 128/74  Pulse: 62  Resp: 16    General: Well developed, nourished, in no acute distress, alert and oriented x3   Dermatological: Skin is warm,  dry and supple bilateral. Nails x 10 are well maintained; remaining integument appears unremarkable at this time. There are no open sores, no preulcerative lesions, no rash or signs of infection present.  Vascular: Dorsalis Pedis artery and Posterior Tibial artery pedal pulses are 2/4 bilateral with immedate capillary fill time. Pedal hair growth present. No varicosities and no lower extremity edema present bilateral.   Neruologic: Grossly intact via light touch bilateral. Vibratory intact via tuning fork bilateral. Protective threshold with Semmes Wienstein monofilament intact to all pedal sites bilateral. Patellar and Achilles deep tendon reflexes 2+ bilateral. No Babinski or clonus noted bilateral.   Musculoskeletal: No gross boney pedal deformities bilateral. No pain, crepitus, or limitation noted with foot and ankle range of motion bilateral. Muscular strength 5/5 in all groups tested bilateral. She is pain on palpation of the 50s of the right foot with mild edema and ecchymosis.  Gait: Unassisted, Nonantalgic.    Radiographs:  Radiographs taken today demonstrate what appears to be a minimally displaced fracture of the distal portion of the proximal phalanx does appear to be an intra-articular fracture. There is some periostitis with a wound on that is starting to heal.  Assessment & Plan:   Assessment: Fracture fifth toe right  foot.  Plan: Discussed her options with her today. At this point we're want to continue to wrap the toe with [place] and an all follow-up with her on an as-needed basis.     Max T. [place], [place]

## 2018-03-19 ENCOUNTER — Other Ambulatory Visit: Payer: Self-pay | Admitting: Internal Medicine

## 2018-03-19 DIAGNOSIS — Z1231 Encounter for screening mammogram for malignant neoplasm of breast: Secondary | ICD-10-CM

## 2018-04-18 ENCOUNTER — Ambulatory Visit
Admission: RE | Admit: 2018-04-18 | Discharge: 2018-04-18 | Disposition: A | Discharge status: Home / Self Care | Payer: BC Managed Care – PPO | Source: Ambulatory Visit | Attending: Internal Medicine | Admitting: Internal Medicine

## 2018-04-18 DIAGNOSIS — Z1231 Encounter for screening mammogram for malignant neoplasm of breast: Secondary | ICD-10-CM

## 2018-06-05 ENCOUNTER — Encounter (INDEPENDENT_AMBULATORY_CARE_PROVIDER_SITE_OTHER): Payer: BC Managed Care – PPO

## 2018-07-03 ENCOUNTER — Ambulatory Visit (INDEPENDENT_AMBULATORY_CARE_PROVIDER_SITE_OTHER): Payer: BC Managed Care – PPO | Admitting: Family Medicine

## 2018-07-03 ENCOUNTER — Encounter (INDEPENDENT_AMBULATORY_CARE_PROVIDER_SITE_OTHER): Payer: Self-pay | Admitting: Family Medicine

## 2018-07-03 VITALS — BP 138/82 | HR 58 | Temp 97.8°F | Ht 64.0 in | Wt 183.0 lb

## 2018-07-03 DIAGNOSIS — Z0289 Encounter for other administrative examinations: Secondary | ICD-10-CM

## 2018-07-03 DIAGNOSIS — Z9189 Other specified personal risk factors, not elsewhere classified: Secondary | ICD-10-CM | POA: Diagnosis not present

## 2018-07-03 DIAGNOSIS — R945 Abnormal results of liver function studies: Secondary | ICD-10-CM

## 2018-07-03 DIAGNOSIS — R7989 Other specified abnormal findings of blood chemistry: Secondary | ICD-10-CM

## 2018-07-03 DIAGNOSIS — Z1331 Encounter for screening for depression: Secondary | ICD-10-CM

## 2018-07-03 DIAGNOSIS — R0602 Shortness of breath: Secondary | ICD-10-CM | POA: Diagnosis not present

## 2018-07-03 DIAGNOSIS — R5383 Other fatigue: Secondary | ICD-10-CM

## 2018-07-03 DIAGNOSIS — Z6831 Body mass index (BMI) 31.0-31.9, adult: Secondary | ICD-10-CM

## 2018-07-03 DIAGNOSIS — I10 Essential (primary) hypertension: Secondary | ICD-10-CM

## 2018-07-03 DIAGNOSIS — E669 Obesity, unspecified: Secondary | ICD-10-CM

## 2018-07-04 ENCOUNTER — Encounter (INDEPENDENT_AMBULATORY_CARE_PROVIDER_SITE_OTHER): Payer: Self-pay | Admitting: Family Medicine

## 2018-07-04 LAB — VITAMIN D 25 HYDROXY (VIT D DEFICIENCY, FRACTURES): Vit D, 25-Hydroxy: 22.3 ng/mL — ABNORMAL LOW (ref 30.0–100.0)

## 2018-07-04 LAB — COMPREHENSIVE METABOLIC PANEL
ALT: 51 IU/L — ABNORMAL HIGH (ref 0–32)
AST: 29 IU/L (ref 0–40)
Albumin/Globulin Ratio: 2.1 (ref 1.2–2.2)
Albumin: 4.6 g/dL (ref 3.6–4.8)
Alkaline Phosphatase: 100 IU/L (ref 39–117)
BUN/Creatinine Ratio: 16 (ref 12–28)
BUN: 17 mg/dL (ref 8–27)
Bilirubin Total: 0.5 mg/dL (ref 0.0–1.2)
CO2: 23 mmol/L (ref 20–29)
Calcium: 9.6 mg/dL (ref 8.7–10.3)
Chloride: 99 mmol/L (ref 96–106)
Creatinine, Ser: 1.04 mg/dL — ABNORMAL HIGH (ref 0.57–1.00)
GFR calc Af Amer: 66 mL/min/{1.73_m2} (ref >59)
GFR calc non Af Amer: 57 mL/min/{1.73_m2} — ABNORMAL LOW (ref >59)
Globulin, Total: 2.2 g/dL (ref 1.5–4.5)
Glucose: 110 mg/dL — ABNORMAL HIGH (ref 65–99)
Potassium: 4.3 mmol/L (ref 3.5–5.2)
Sodium: 137 mmol/L (ref 134–144)
Total Protein: 6.8 g/dL (ref 6.0–8.5)

## 2018-07-04 LAB — FOLATE: Folate: >20 ng/mL (ref >3.0)

## 2018-07-04 LAB — T4, FREE: Free T4: 1.11 ng/dL (ref 0.82–1.77)

## 2018-07-04 LAB — VITAMIN B12: Vitamin B-12: 431 pg/mL (ref 232–1245)

## 2018-07-04 LAB — T3: T3, Total: 133 ng/dL (ref 71–180)

## 2018-07-04 LAB — INSULIN, RANDOM: INSULIN: 16.4 u[IU]/mL (ref 2.6–24.9)

## 2018-07-04 LAB — TSH: TSH: 1.47 u[IU]/mL (ref 0.450–4.500)

## 2018-07-07 NOTE — Progress Notes (Signed)
.  Office: [phone]  /  Fax: [phone]   HPI:   Chief Complaint: OBESITY  Julie Moran (MR# [id number]) is a 62 y.o. female who presents on 07/07/2018 for obesity evaluation and treatment. Current BMI is Body mass index is 31.41 kg/m.Marland Kitchen Julie Moran has struggled with obesity for years and has been unsuccessful in either losing weight or maintaining long term weight loss. Toree heard about our clinic from [employer]. Julie Moran attended our information session and states she is currently in the action stage of change and ready to dedicate time achieving and maintaining a healthier weight.  Julie Moran states her family eats meals together she thinks her family will eat healthier with  her her desired weight loss is 50 lbs she has been heavy most of  her life she started gaining weight after marriage in 1980 her heaviest weight ever was 190 to 191 lbs. she snacks frequently in the evenings she is frequently drinking liquids with calories she has binge eating behaviors she struggles with emotional eating    Fatigue Indea feels her energy is lower than it should be. This has worsened with weight gain and has not worsened recently. Julie Moran admits to daytime somnolence and denies waking up still tired. Patient is at risk for obstructive sleep apnea. Patent has a history of symptoms of daytime fatigue and hypertension. Patient generally gets 6 or 7 hours of sleep per night, and states they generally have restless sleep. Snoring is present. Apneic episodes are not present. Epworth Sleepiness Score is 15  EKG was ordered today and shows sinus bradycardia. Julie Moran had a Hgb A1c of 5.2 recently.  Dyspnea on exertion Julie Moran notes increasing shortness of breath with exercising and seems to be worsening over time with weight gain. She notes getting out of breath sooner with activity than she used to. This has not gotten worse recently. EKG was ordered today and shows sinus bradycardia. Julie Moran had a Hgb  A1c of 5.2 recently. Julie Moran denies orthopnea.  Elevated Liver Enzymes Julie Moran has a new dx of elevated ALT. She denies abdominal pain or jaundice and has never been told of any liver problems in the past. She denies excessive alcohol intake.  Hypertension Julie Moran is a 62 y.o. female with hypertension. Julie Moran denies chest pain. EKG was ordered today and shows sinus bradycardia. She is attempting to work on weight loss to help control her blood pressure with the goal of decreasing her risk of heart attack and stroke. Sherrys blood pressure is  Controlled today.  At risk for cardiovascular disease Julie Moran is at a higher than average risk for cardiovascular disease due to obesity and hypertension. She currently denies any chest pain.  Depression Screen Julie Moran's Food and Mood (modified PHQ-9) score was  Depression screen PHQ 2/9 07/03/2018  Decreased Interest 1  Down, Depressed, Hopeless 1  PHQ - 2 Score 2  Altered sleeping 1  Tired, decreased energy 2  Change in appetite 2  Feeling bad or failure about yourself  0  Trouble concentrating 0  Moving slowly or fidgety/restless 0  Suicidal thoughts 0  PHQ-9 Score 7  Difficult doing work/chores Not difficult at all    ALLERGIES: Allergies  Allergen Reactions  . No Known Allergies     MEDICATIONS: Current Outpatient Medications on File Prior to Visit  Medication Sig Dispense Refill  . amLODipine (NORVASC) 5 MG tablet Take 1 tablet by mouth Daily.     . Nutritional Supplements (JUICE PLUS FIBRE PO) Take 2  tablets by mouth 3 (three) times daily.    . pravastatin (PRAVACHOL) 20 MG tablet      No current facility-administered medications on file prior to visit.     PAST MEDICAL HISTORY: Past Medical History:  Diagnosis Date  . Allergy   . Atrophic vaginitis   . Birt-Hogg-Dube syndrome    lung cysts   . Birt-Hogg-Dube syndrome   . Chest pain   . Colon polyps    adenomatous  . Constipation   . Depression   .  Diverticulosis   . Dry skin   . Fatty liver   . Floaters   . Gall bladder disease   . Glaucoma   . Heart murmur   . Herpes simplex   . Hyperlipidemia   . Hypertension   . Joint pain   . Lichen sclerosus   . Pancreatitis   . Pancreatitis   . Pneumothorax, right    spontaneous  . Shortness of breath   . Shortness of breath on exertion   . Swelling of both lower extremities   . Trouble in sleeping     PAST SURGICAL HISTORY: Past Surgical History:  Procedure Laterality Date  . BREAST BIOPSY  03/29/2006  . BREAST BIOPSY Bilateral 06/14/1998  . BREAST EXCISIONAL BIOPSY Bilateral 1999  . BREAST LUMPECTOMY  [phone]   x5 times total , both breasts  . CHOLECYSTECTOMY    . COLONOSCOPY    . hysterctomy     abdominal  . POLYPECTOMY    . right ovary removed    . UPPER GASTROINTESTINAL ENDOSCOPY      SOCIAL HISTORY: Social History   Tobacco Use  . Smoking status: Never Smoker  . Smokeless tobacco: Never Used  Substance Use Topics  . Alcohol use: No    Alcohol/week: 0.0 oz  . Drug use: No    FAMILY HISTORY: Family History  Problem Relation Age of Onset  . Diabetes Mother   . Breast cancer Mother   . CVA Mother   . Hypertension Mother   . Heart disease Mother   . Thyroid disease Mother   . Cancer Mother   . Diabetes Father   . Prostate cancer Father   . Heart disease Father   . Hypertension Father   . Hyperlipidemia Father   . Thyroid disease Father   . Cancer Father   . Obesity Father   . Liver disease Paternal Grandmother   . Breast cancer Maternal Aunt        x 3  . CVA Paternal Grandfather   . Colon cancer Neg Hx   . Esophageal cancer Neg Hx   . Stomach cancer Neg Hx   . Rectal cancer Neg Hx     ROS: Review of Systems  Constitutional: Positive for malaise/fatigue.  Eyes:       Floaters Wear Glasses or Contacts   Respiratory: Positive for shortness of breath (on exertion).   Cardiovascular: Negative for chest pain and orthopnea.    Gastrointestinal: Negative for abdominal pain.  Skin:       Dryness Negative for jaundice  Psychiatric/Behavioral: The patient has insomnia.     PHYSICAL EXAM: Blood pressure 138/82, pulse (!) 58, temperature 97.8 F (36.6 C), temperature source Oral, height 5\' 4"  (1.626 m), weight 183 lb (83 kg), SpO2 98 %. Body mass index is 31.41 kg/m. Physical Exam  Constitutional: She is oriented to person, place, and time. She appears well-developed and well-nourished.  HENT:  Head: Normocephalic and atraumatic.  Nose:  Nose normal.  Eyes: EOM are normal. No scleral icterus.  Neck: Normal range of motion. Neck supple. No thyromegaly present.  Cardiovascular: Regular rhythm. Bradycardia present.  Pulmonary/Chest: Effort normal. No respiratory distress.  Abdominal: Soft. There is no tenderness.  + obesity  Musculoskeletal: Normal range of motion.  Range of Motion normal in all 4 extremities  Neurological: She is alert and oriented to person, place, and time. Coordination normal.  Skin: Skin is warm and dry.  Psychiatric: She has a normal mood and affect. Her behavior is normal.  Vitals reviewed.   RECENT LABS AND TESTS: BMET    Component Value Date/Time   NA 137 07/03/2018 1105   K 4.3 07/03/2018 1105   CL 99 07/03/2018 1105   CO2 23 07/03/2018 1105   GLUCOSE 110 (H) 07/03/2018 1105   GLUCOSE 106 (H) 07/02/2012 0510   BUN 17 07/03/2018 1105   CREATININE 1.04 (H) 07/03/2018 1105   CALCIUM 9.6 07/03/2018 1105   GFRNONAA 57 (L) 07/03/2018 1105   GFRAA 66 07/03/2018 1105   No results found for: HGBA1C Lab Results  Component Value Date   INSULIN 16.4 07/03/2018   CBC    Component Value Date/Time   WBC 7.8 07/02/2012 0510   RBC 4.79 07/02/2012 0510   HGB 14.4 07/02/2012 0510   HCT 40.9 07/02/2012 0510   PLT 165 07/02/2012 0510   MCV 85.4 07/02/2012 0510   MCH 30.1 07/02/2012 0510   MCHC 35.2 07/02/2012 0510   RDW 12.9 07/02/2012 0510   LYMPHSABS 2.7 12/27/2008 1722    MONOABS 0.4 12/27/2008 1722   EOSABS 0.1 12/27/2008 1722   BASOSABS 0.0 12/27/2008 1722   Iron/TIBC/Ferritin/ %Sat No results found for: IRON, TIBC, FERRITIN, IRONPCTSAT Lipid Panel  No results found for: CHOL, TRIG, HDL, CHOLHDL, VLDL, LDLCALC, LDLDIRECT Hepatic Function Panel     Component Value Date/Time   PROT 6.8 07/03/2018 1105   ALBUMIN 4.6 07/03/2018 1105   AST 29 07/03/2018 1105   ALT 51 (H) 07/03/2018 1105   ALKPHOS 100 07/03/2018 1105   BILITOT 0.5 07/03/2018 1105   BILIDIR <0.1 07/02/2012 0521   IBILI NOT CALCULATED 07/02/2012 0521      Component Value Date/Time   TSH 1.470 07/03/2018 1105   Vitamin D There are no recent lab results  ECG  shows NSR with a rate of 57  INDIRECT CALORIMETER done today shows a VO2 of 152 and a REE of 1055. Her calculated basal metabolic rate is 4401 thus her basal metabolic rate is better than expected.    ASSESSMENT AND PLAN: Other fatigue - Plan: EKG 12-Lead, Insulin, random, VITAMIN D 25 Hydroxy (Vit-D Deficiency, Fractures), T3, T4, free, TSH, Vitamin B12, Folate  Shortness of breath on exertion  Elevated LFTs - Plan: Comprehensive metabolic panel  Essential hypertension  Depression screening  At risk for heart disease  Class 1 obesity with serious comorbidity and body mass index (BMI) of 31.0 to 31.9 in adult, unspecified obesity type  PLAN:  Fatigue Julie Moran was informed that her fatigue may be related to obesity, depression or many other causes. Labs will be ordered, and in the meanwhile Julie Moran has agreed to work on diet, exercise and weight loss to help with fatigue. Proper sleep hygiene was discussed including the need for 7-8 hours of quality sleep each night. A sleep study was not ordered based on symptoms and Epworth score. We will order EKG and indirect calorimetry today.  Dyspnea on exertion Julie Moran's shortness of breath appears to  be obesity related and exercise induced. She has agreed to work on weight loss  and gradually increase exercise to treat her exercise induced shortness of breath. If Julie Moran follows our instructions and loses weight without improvement of her shortness of breath, we will plan to refer to pulmonology. We will order labs, indirect calorimetry and EKG today and will monitor this condition regularly. Julie Moran agrees to this plan.  Elevated Liver Enzymes We discussed the likely diagnosis of non alcoholic fatty liver disease today and how this condition is obesity related. Julie Moran was educated on her risk of developing NASH or even liver failure and the only proven treatment for NAFLD was weight loss. Julie Moran agreed to continue with her weight loss efforts with healthier diet and exercise as an essential part of her treatment plan.  Hypertension We discussed sodium restriction, working on healthy weight loss, and a regular exercise program as the means to achieve improved blood pressure control. Julie Moran agreed with this plan and agreed to follow up as directed. We will order CMP and EKG today and will continue to monitor her blood pressure as well as her progress with the above lifestyle modifications. She will continue her medications as prescribed and will watch for signs of hypotension as she continues her lifestyle modifications.  Cardiovascular risk counseling Julie Moran was given extended (15 minutes) coronary artery disease prevention counseling today. She is 62 y.o. female and has risk factors for heart disease including obesity and hypertension. We discussed intensive lifestyle modifications today with an emphasis on specific weight loss instructions and strategies. Pt was also informed of the importance of increasing exercise and decreasing saturated fats to help prevent heart disease.  Depression Screen Julie Moran had a mildly positive depression screening. Depression is commonly associated with obesity and often results in emotional eating behaviors. We will monitor this closely and work on  CBT to help improve the non-hunger eating patterns. Referral to Psychology may be required if no improvement is seen as she continues in our clinic.  Obesity Constanza is currently in the action stage of change and her goal is to continue with weight loss efforts She has agreed to follow the Category 1 plan Janessa has been instructed to work up to a goal of 150 minutes of combined cardio and strengthening exercise per week for weight loss and overall health benefits. We discussed the following Behavioral Modification Strategies today: planning for success, better snacking choices, increasing lean protein intake, increasing vegetables and work on meal planning and easy cooking plans  Dae has agreed to follow up with our clinic in 2 weeks. She was informed of the importance of frequent follow up visits to maximize her success with intensive lifestyle modifications for her multiple health conditions. She was informed we would discuss her lab results at her next visit unless there is a critical issue that needs to be addressed sooner. Larene agreed to keep her next visit at the agreed upon time to discuss these results.    OBESITY BEHAVIORAL INTERVENTION VISIT  Today's visit was # 1 out of 22.  Starting weight: 183 lbs Starting date: 07/03/18 Today's weight : 183 lbs  Today's date: 07/03/2018 Total lbs lost to date: 0    ASK: We discussed the diagnosis of obesity with Reuel Boom today and Julie Moran agreed to give [place] permission to discuss obesity behavioral modification therapy today.  ASSESS: Meeka has the diagnosis of obesity and her BMI today is 31.4 Vonya is in the action stage of change  ADVISE: Evolet was educated on the multiple health risks of obesity as well as the benefit of weight loss to improve her health. She was advised of the need for long term treatment and the importance of lifestyle modifications.  AGREE: Multiple dietary modification options and treatment  options were discussed and  Bridgit agreed to the above obesity treatment plan.   I, Doreene Nest, am acting as transcriptionist for Eber Jones, MD    I have reviewed the above documentation for accuracy and completeness, and I agree with the above. - Ilene Qua, MD

## 2018-07-10 ENCOUNTER — Encounter (INDEPENDENT_AMBULATORY_CARE_PROVIDER_SITE_OTHER): Payer: Self-pay | Admitting: Family Medicine

## 2018-07-17 ENCOUNTER — Ambulatory Visit (INDEPENDENT_AMBULATORY_CARE_PROVIDER_SITE_OTHER): Payer: BC Managed Care – PPO | Admitting: Family Medicine

## 2018-07-17 VITALS — BP 140/80 | HR 59 | Temp 97.6°F | Ht 64.0 in | Wt 178.0 lb

## 2018-07-17 DIAGNOSIS — E559 Vitamin D deficiency, unspecified: Secondary | ICD-10-CM | POA: Diagnosis not present

## 2018-07-17 DIAGNOSIS — E669 Obesity, unspecified: Secondary | ICD-10-CM | POA: Diagnosis not present

## 2018-07-17 DIAGNOSIS — E8881 Metabolic syndrome: Secondary | ICD-10-CM | POA: Diagnosis not present

## 2018-07-17 DIAGNOSIS — Z9189 Other specified personal risk factors, not elsewhere classified: Secondary | ICD-10-CM | POA: Diagnosis not present

## 2018-07-17 DIAGNOSIS — Z6830 Body mass index (BMI) 30.0-30.9, adult: Secondary | ICD-10-CM

## 2018-07-17 MED ORDER — VITAMIN D (ERGOCALCIFEROL) 1.25 MG (50000 UNIT) PO CAPS: 50000.0000 [IU] | ORAL_CAPSULE | ORAL | 0 refills | Status: DC

## 2018-07-21 ENCOUNTER — Encounter (INDEPENDENT_AMBULATORY_CARE_PROVIDER_SITE_OTHER): Payer: Self-pay | Admitting: Family Medicine

## 2018-07-21 NOTE — Progress Notes (Signed)
Office: [phone]  /  Fax: [phone]   HPI:   Chief Complaint: OBESITY Julie Moran is here to discuss her progress with her obesity treatment plan. She is on the Category 1 plan and is following her eating plan approximately 100 % of the time. She states she is exercising 0 minutes 0 times per week. [name] was tracking on MyFitnessPal and she found the meal plan boring. She is looking for options at lunch to go out. Her weight is 178 lb (80.7 kg) today and has had a weight loss of 5 pounds over a period of 2 weeks since her last visit. She has lost 5 lbs since starting treatment with [place].  Insulin Resistance Julie Moran has a diagnosis of insulin resistance based on her elevated fasting insulin level of 16.4 and recent Hgb A1c of 5.2.. Although Julie Moran's blood glucose readings are still under good control, insulin resistance puts her at greater risk of metabolic syndrome and diabetes. She is not taking metformin currently and continues to work on diet and exercise to decrease risk of diabetes.  At risk for diabetes Julie Moran is at higher than average risk for developing diabetes due to her obesity and insulin resistance. She currently denies polyuria or polydipsia.  Vitamin D deficiency Julie Moran has a diagnosis of vitamin D deficiency. She is not currently taking vit D and admits fatigue, but denies nausea, vomiting or muscle weakness.  ALLERGIES: Allergies  Allergen Reactions  . No Known Allergies     MEDICATIONS: Current Outpatient Medications on File Prior to Visit  Medication Sig Dispense Refill  . amLODipine (NORVASC) 5 MG tablet Take 1 tablet by mouth Daily.     . Nutritional Supplements (JUICE PLUS FIBRE PO) Take 2 tablets by mouth 3 (three) times daily.    . pravastatin (PRAVACHOL) 20 MG tablet      No current facility-administered medications on file prior to visit.     PAST MEDICAL HISTORY: Past Medical History:  Diagnosis Date  . Allergy   . Atrophic vaginitis   .  Birt-Hogg-Dube syndrome    lung cysts   . Birt-Hogg-Dube syndrome   . Chest pain   . Colon polyps    adenomatous  . Constipation   . Depression   . Diverticulosis   . Dry skin   . Fatty liver   . Floaters   . Gall bladder disease   . Glaucoma   . Heart murmur   . Herpes simplex   . Hyperlipidemia   . Hypertension   . Joint pain   . Lichen sclerosus   . Pancreatitis   . Pancreatitis   . Pneumothorax, right    spontaneous  . Shortness of breath   . Shortness of breath on exertion   . Swelling of both lower extremities   . Trouble in sleeping     PAST SURGICAL HISTORY: Past Surgical History:  Procedure Laterality Date  . BREAST BIOPSY  03/29/2006  . BREAST BIOPSY Bilateral 06/14/1998  . BREAST EXCISIONAL BIOPSY Bilateral 1999  . BREAST LUMPECTOMY  [phone]   x5 times total , both breasts  . CHOLECYSTECTOMY    . COLONOSCOPY    . hysterctomy     abdominal  . POLYPECTOMY    . right ovary removed    . UPPER GASTROINTESTINAL ENDOSCOPY      SOCIAL HISTORY: Social History   Tobacco Use  . Smoking status: Never Smoker  . Smokeless tobacco: Never Used  Substance Use Topics  . Alcohol use: No  Alcohol/week: 0.0 standard drinks  . Drug use: No    FAMILY HISTORY: Family History  Problem Relation Age of Onset  . Diabetes Mother   . Breast cancer Mother   . CVA Mother   . Hypertension Mother   . Heart disease Mother   . Thyroid disease Mother   . Cancer Mother   . Diabetes Father   . Prostate cancer Father   . Heart disease Father   . Hypertension Father   . Hyperlipidemia Father   . Thyroid disease Father   . Cancer Father   . Obesity Father   . Liver disease Paternal Grandmother   . Breast cancer Maternal Aunt        x 3  . CVA Paternal Grandfather   . Colon cancer Neg Hx   . Esophageal cancer Neg Hx   . Stomach cancer Neg Hx   . Rectal cancer Neg Hx     ROS: Review of Systems  Constitutional: Positive for malaise/fatigue and weight  loss.  Gastrointestinal: Negative for nausea and vomiting.  Genitourinary: Negative for frequency.  Musculoskeletal:       Negative for muscle weakness  Endo/Heme/Allergies: Negative for polydipsia.    PHYSICAL EXAM: Blood pressure 140/80, pulse (!) 59, temperature 97.6 F (36.4 C), temperature source Oral, height 5\' 4"  (1.626 m), weight 178 lb (80.7 kg), SpO2 98 %. Body mass index is 30.55 kg/m. Physical Exam  Constitutional: She is oriented to person, place, and time. She appears well-developed and well-nourished.  Cardiovascular: Normal rate.  Pulmonary/Chest: Effort normal.  Musculoskeletal: Normal range of motion.  Neurological: She is oriented to person, place, and time.  Skin: Skin is warm and dry.  Psychiatric: She has a normal mood and affect. Her behavior is normal.  Vitals reviewed.   RECENT LABS AND TESTS: BMET    Component Value Date/Time   NA 137 07/03/2018 1105   K 4.3 07/03/2018 1105   CL 99 07/03/2018 1105   CO2 23 07/03/2018 1105   GLUCOSE 110 (H) 07/03/2018 1105   GLUCOSE 106 (H) 07/02/2012 0510   BUN 17 07/03/2018 1105   CREATININE 1.04 (H) 07/03/2018 1105   CALCIUM 9.6 07/03/2018 1105   GFRNONAA 57 (L) 07/03/2018 1105   GFRAA 66 07/03/2018 1105   No results found for: HGBA1C Lab Results  Component Value Date   INSULIN 16.4 07/03/2018   CBC    Component Value Date/Time   WBC 7.8 07/02/2012 0510   RBC 4.79 07/02/2012 0510   HGB 14.4 07/02/2012 0510   HCT 40.9 07/02/2012 0510   PLT 165 07/02/2012 0510   MCV 85.4 07/02/2012 0510   MCH 30.1 07/02/2012 0510   MCHC 35.2 07/02/2012 0510   RDW 12.9 07/02/2012 0510   LYMPHSABS 2.7 12/27/2008 1722   MONOABS 0.4 12/27/2008 1722   EOSABS 0.1 12/27/2008 1722   BASOSABS 0.0 12/27/2008 1722   Iron/TIBC/Ferritin/ %Sat No results found for: IRON, TIBC, FERRITIN, IRONPCTSAT Lipid Panel  No results found for: CHOL, TRIG, HDL, CHOLHDL, VLDL, LDLCALC, LDLDIRECT Hepatic Function Panel     Component  Value Date/Time   PROT 6.8 07/03/2018 1105   ALBUMIN 4.6 07/03/2018 1105   AST 29 07/03/2018 1105   ALT 51 (H) 07/03/2018 1105   ALKPHOS 100 07/03/2018 1105   BILITOT 0.5 07/03/2018 1105   BILIDIR <0.1 07/02/2012 0521   IBILI NOT CALCULATED 07/02/2012 0521      Component Value Date/Time   TSH 1.470 07/03/2018 1105   Results for Osias,  SHINIKA ESTELLE (MRN [id number]) as of 07/21/2018 17:43  Ref. Range 07/03/2018 11:05  Vitamin D, 25-Hydroxy Latest Ref Range: 30.0 - 100.0 ng/mL 22.3 (L)   ASSESSMENT AND PLAN: Insulin resistance  Vitamin D deficiency - Plan: Vitamin D, Ergocalciferol, (DRISDOL) 50000 units CAPS capsule  At risk for diabetes mellitus  Class 1 obesity with serious comorbidity and body mass index (BMI) of 30.0 to 30.9 in adult, unspecified obesity type  PLAN:  Insulin Resistance Heidee will continue to work on weight loss, exercise, and decreasing simple carbohydrates in her diet to help decrease the risk of diabetes. She was informed that eating too many simple carbohydrates or too many calories at one sitting increases the likelihood of GI side effects. We will repeat Hgb A1c and insulin. Zalia agreed to follow up with [place] as directed to monitor her progress.  Diabetes risk counseling Daylin was given extended (30 minutes) diabetes prevention counseling today. She is 62 y.o. female and has risk factors for diabetes including obesity and insulin resistance. We discussed intensive lifestyle modifications today with an emphasis on weight loss as well as increasing exercise and decreasing simple carbohydrates in her diet.  Vitamin D Deficiency Josefine was informed that low vitamin D levels contributes to fatigue and are associated with obesity, breast, and colon cancer. She agrees to start prescription Vit D @50 ,000 IU every week #4 with no refills and we will retest vitamin D level in 3 months. Lamanda will follow up for routine testing of vitamin D, at least 2-3 times per year.  She was informed of the risk of over-replacement of vitamin D and agrees to not increase her dose unless she discusses this with [place] first.  Obesity Rosamae is currently in the action stage of change. As such, her goal is to continue with weight loss efforts She has agreed to keep a food journal with 300 to 400 calories and 30 grams of protein at lunch daily and follow the Category 1 plan Ketzaly has been instructed to work up to a goal of 150 minutes of combined cardio and strengthening exercise per week for weight loss and overall health benefits. We discussed the following Behavioral Modification Strategies today: better snacking choices, planning for success, increasing lean protein intake, increasing vegetables, work on meal planning and easy cooking plans and ways to avoid night time snacking  Dreyah has agreed to follow up with our clinic in 2 weeks. She was informed of the importance of frequent follow up visits to maximize her success with intensive lifestyle modifications for her multiple health conditions.   OBESITY BEHAVIORAL INTERVENTION VISIT  Today's visit was # 2 out of 22.  Starting weight: 183 lbs Starting date: 07/03/18 Today's weight : 178 lbs  Today's date: 07/17/2018 Total lbs lost to date: 5    ASK: We discussed the diagnosis of obesity with Reuel Boom today and Judeen Hammans agreed to give [place] permission to discuss obesity behavioral modification therapy today.  ASSESS: Arabelle has the diagnosis of obesity and her BMI today is 30.54 Nadean is in the action stage of change   ADVISE: Dezyre was educated on the multiple health risks of obesity as well as the benefit of weight loss to improve her health. She was advised of the need for long term treatment and the importance of lifestyle modifications.  AGREE: Multiple dietary modification options and treatment options were discussed and  Ameah agreed to the above obesity treatment plan.  Corey Skains, am acting  as transcriptionist for  Eber Jones, MD  I have reviewed the above documentation for accuracy and completeness, and I agree with the above. - Ilene Qua, MD

## 2018-07-22 ENCOUNTER — Encounter (INDEPENDENT_AMBULATORY_CARE_PROVIDER_SITE_OTHER): Payer: Self-pay | Admitting: Family Medicine

## 2018-08-04 ENCOUNTER — Ambulatory Visit (INDEPENDENT_AMBULATORY_CARE_PROVIDER_SITE_OTHER): Payer: BC Managed Care – PPO | Admitting: Bariatrics

## 2018-08-04 VITALS — BP 114/74 | HR 50 | Temp 97.8°F | Ht 64.0 in | Wt 176.0 lb

## 2018-08-04 DIAGNOSIS — E8881 Metabolic syndrome: Secondary | ICD-10-CM

## 2018-08-04 DIAGNOSIS — Z6830 Body mass index (BMI) 30.0-30.9, adult: Secondary | ICD-10-CM

## 2018-08-04 DIAGNOSIS — Z9189 Other specified personal risk factors, not elsewhere classified: Secondary | ICD-10-CM

## 2018-08-04 DIAGNOSIS — E669 Obesity, unspecified: Secondary | ICD-10-CM

## 2018-08-04 DIAGNOSIS — E559 Vitamin D deficiency, unspecified: Secondary | ICD-10-CM

## 2018-08-04 MED ORDER — VITAMIN D (ERGOCALCIFEROL) 1.25 MG (50000 UNIT) PO CAPS: 50000.0000 [IU] | ORAL_CAPSULE | ORAL | 0 refills | Status: DC

## 2018-08-05 NOTE — Progress Notes (Signed)
Office: [phone]  /  Fax: [phone]   HPI:   Chief Complaint: OBESITY Julie Moran is here to discuss her progress with her obesity treatment plan. She is on the keep a food journal with 300-400 calories and 30 grams of protein at lunch daily and follow the Category 1 plan and is following her eating plan approximately 50 % of the time. She states she is exercising 0 minutes 0 times per week. Julie Moran lost 7 lbs and she has been journaling for all meals (approximately 1,000 to 1,100 calories) and 30 grams of protein at lunch.  Her weight is 176 lb (79.8 kg) today and has had a weight loss of 2 pounds over a period of 2 to 3 weeks since her last visit. She has lost 7 lbs since starting treatment with [place].  Insulin Resistance Julie Moran has a diagnosis of insulin resistance based on her elevated fasting insulin level >5. Although Julie Moran's blood glucose readings are still under good control, insulin resistance puts her at greater risk of metabolic syndrome and diabetes. She denies hyperphagia and she is not on metformin, she wants to work on diet and exercise to decrease risk of diabetes.  At risk for diabetes Julie Moran is at higher than average risk for developing diabetes due to her obesity and insulin resistance. She currently denies polyuria or polydipsia.  Vitamin D Deficiency Julie Moran has a diagnosis of vitamin D deficiency. She is currently taking prescription Vit D and denies nausea, vomiting or muscle weakness.  ALLERGIES: Allergies  Allergen Reactions  . No Known Allergies     MEDICATIONS: Current Outpatient Medications on File Prior to Visit  Medication Sig Dispense Refill  . amLODipine (NORVASC) 5 MG tablet Take 1 tablet by mouth Daily.     . Nutritional Supplements (JUICE PLUS FIBRE PO) Take 2 tablets by mouth 3 (three) times daily.    . pravastatin (PRAVACHOL) 20 MG tablet      No current facility-administered medications on file prior to visit.     PAST MEDICAL HISTORY: Past  Medical History:  Diagnosis Date  . Allergy   . Atrophic vaginitis   . Birt-Hogg-Dube syndrome    lung cysts   . Birt-Hogg-Dube syndrome   . Chest pain   . Colon polyps    adenomatous  . Constipation   . Depression   . Diverticulosis   . Dry skin   . Fatty liver   . Floaters   . Gall bladder disease   . Glaucoma   . Heart murmur   . Herpes simplex   . Hyperlipidemia   . Hypertension   . Joint pain   . Lichen sclerosus   . Pancreatitis   . Pancreatitis   . Pneumothorax, right    spontaneous  . Shortness of breath   . Shortness of breath on exertion   . Swelling of both lower extremities   . Trouble in sleeping     PAST SURGICAL HISTORY: Past Surgical History:  Procedure Laterality Date  . BREAST BIOPSY  03/29/2006  . BREAST BIOPSY Bilateral 06/14/1998  . BREAST EXCISIONAL BIOPSY Bilateral 1999  . BREAST LUMPECTOMY  [phone]   x5 times total , both breasts  . CHOLECYSTECTOMY    . COLONOSCOPY    . hysterctomy     abdominal  . POLYPECTOMY    . right ovary removed    . UPPER GASTROINTESTINAL ENDOSCOPY      SOCIAL HISTORY: Social History   Tobacco Use  . Smoking status: Never Smoker  .  Smokeless tobacco: Never Used  Substance Use Topics  . Alcohol use: No    Alcohol/week: 0.0 standard drinks  . Drug use: No    FAMILY HISTORY: Family History  Problem Relation Age of Onset  . Diabetes Mother   . Breast cancer Mother   . CVA Mother   . Hypertension Mother   . Heart disease Mother   . Thyroid disease Mother   . Cancer Mother   . Diabetes Father   . Prostate cancer Father   . Heart disease Father   . Hypertension Father   . Hyperlipidemia Father   . Thyroid disease Father   . Cancer Father   . Obesity Father   . Liver disease Paternal Grandmother   . Breast cancer Maternal Aunt        x 3  . CVA Paternal Grandfather   . Colon cancer Neg Hx   . Esophageal cancer Neg Hx   . Stomach cancer Neg Hx   . Rectal cancer Neg Hx      ROS: Review of Systems  Constitutional: Positive for weight loss.  Gastrointestinal: Negative for nausea and vomiting.  Genitourinary: Negative for frequency.  Musculoskeletal:       Negative muscle weakness  Endo/Heme/Allergies: Negative for polydipsia.       Negative hyperghagia    PHYSICAL EXAM: Blood pressure 114/74, pulse (!) 50, temperature 97.8 F (36.6 C), temperature source Oral, height 5\' 4"  (1.626 m), weight 176 lb (79.8 kg), SpO2 98 %. Body mass index is 30.21 kg/m. Physical Exam  Constitutional: She is oriented to person, place, and time. She appears well-developed and well-nourished.  Cardiovascular: Normal rate.  Pulmonary/Chest: Effort normal.  Musculoskeletal: Normal range of motion.  Neurological: She is oriented to person, place, and time.  Skin: Skin is warm and dry.  Psychiatric: She has a normal mood and affect. Her behavior is normal.  Vitals reviewed.   RECENT LABS AND TESTS: BMET    Component Value Date/Time   NA 137 07/03/2018 1105   K 4.3 07/03/2018 1105   CL 99 07/03/2018 1105   CO2 23 07/03/2018 1105   GLUCOSE 110 (H) 07/03/2018 1105   GLUCOSE 106 (H) 07/02/2012 0510   BUN 17 07/03/2018 1105   CREATININE 1.04 (H) 07/03/2018 1105   CALCIUM 9.6 07/03/2018 1105   GFRNONAA 57 (L) 07/03/2018 1105   GFRAA 66 07/03/2018 1105   No results found for: HGBA1C Lab Results  Component Value Date   INSULIN 16.4 07/03/2018   CBC    Component Value Date/Time   WBC 7.8 07/02/2012 0510   RBC 4.79 07/02/2012 0510   HGB 14.4 07/02/2012 0510   HCT 40.9 07/02/2012 0510   PLT 165 07/02/2012 0510   MCV 85.4 07/02/2012 0510   MCH 30.1 07/02/2012 0510   MCHC 35.2 07/02/2012 0510   RDW 12.9 07/02/2012 0510   LYMPHSABS 2.7 12/27/2008 1722   MONOABS 0.4 12/27/2008 1722   EOSABS 0.1 12/27/2008 1722   BASOSABS 0.0 12/27/2008 1722   Iron/TIBC/Ferritin/ %Sat No results found for: IRON, TIBC, FERRITIN, IRONPCTSAT Lipid Panel  No results found for:  CHOL, TRIG, HDL, CHOLHDL, VLDL, LDLCALC, LDLDIRECT Hepatic Function Panel     Component Value Date/Time   PROT 6.8 07/03/2018 1105   ALBUMIN 4.6 07/03/2018 1105   AST 29 07/03/2018 1105   ALT 51 (H) 07/03/2018 1105   ALKPHOS 100 07/03/2018 1105   BILITOT 0.5 07/03/2018 1105   BILIDIR <0.1 07/02/2012 0521   IBILI NOT CALCULATED  07/02/2012 0521      Component Value Date/Time   TSH 1.470 07/03/2018 1105  Results for NADIAH, CORBIT (MRN [id number]) as of 08/05/2018 14:01  Ref. Range 07/03/2018 11:05  Vitamin D, 25-Hydroxy Latest Ref Range: 30.0 - 100.0 ng/mL 22.3 (L)    ASSESSMENT AND PLAN: Insulin resistance  Vitamin D deficiency - Plan: Vitamin D, Ergocalciferol, (DRISDOL) 50000 units CAPS capsule  At risk for diabetes mellitus  Class 1 obesity with serious comorbidity and body mass index (BMI) of 30.0 to 30.9 in adult, unspecified obesity type  PLAN:  Insulin Resistance Julie Moran will continue to work on weight loss, exercise, and decreasing simple carbohydrates in her diet to help decrease the risk of diabetes. We dicussed metformin including benefits and risks. She was informed that eating too many simple carbohydrates or too many calories at one sitting increases the likelihood of GI side effects. Julie Moran is not on medications and she does not want to start any medications for insulin resistance. Julie Moran agrees to follow up with our clinic in 2 weeks with Dr. Adair Patter as directed to monitor her progress.  Diabetes risk counselling Julie Moran was given extended (15 minutes) diabetes prevention counseling today. She is 62 y.o. female and has risk factors for diabetes including obesity and insulin resistance. We discussed intensive lifestyle modifications today with an emphasis on weight loss as well as increasing exercise and decreasing simple carbohydrates in her diet.  Vitamin D Deficiency Julie Moran was informed that low vitamin D levels contributes to fatigue and are associated with  obesity, breast, and colon cancer. Julie Moran agrees to continue to taking prescription Vit D @50 ,000 IU every week #4 and we will refill for 1 month. She will follow up for routine testing of vitamin D, at least 2-3 times per year. She was informed of the risk of over-replacement of vitamin D and agrees to not increase her dose unless she discusses this with [place] first. Julie Moran agrees to follow up with our clinic in 2 weeks with Dr. Adair Patter.  Obesity Julie Moran is currently in the action stage of change. As such, her goal is to continue with weight loss efforts She has agreed to keep a food journal with 300-400 calories and 30+ grams of protein  and follow the Category 1 plan Julie Moran has been instructed to work up to a goal of 150 minutes of combined cardio and strengthening exercise per week or walking (7,000 steps) goal 5 days a week. Begin resistance training (small weights and resistance) for 15 minutes 2 times per week for weight loss and overall health benefits. We discussed the following Behavioral Modification Strategies today: increasing lean protein intake, decreasing simple carbohydrates , increasing vegetables, work on meal planning and easy cooking plans, and no skipping meals Handout sheet given on protein content of foods, make life easier, "on the road", and we reviewed MyFitness Pal.  Julie Moran has agreed to follow up with our clinic in 2 weeks with Dr. Adair Patter. She was informed of the importance of frequent follow up visits to maximize her success with intensive lifestyle modifications for her multiple health conditions.   OBESITY BEHAVIORAL INTERVENTION VISIT  Today's visit was # 3.   Starting weight: 183 lbs Starting date: 07/03/18 Today's weight : 176 lbs  Today's date: 08/04/2018 Total lbs lost to date: 7 We discussed the following behavioral interventions at this visit.    ASK: We discussed the diagnosis of obesity with Julie Moran today and Julie Moran agreed to give [place] permission  to discuss  obesity behavioral modification therapy today.  ASSESS: Julie Moran has the diagnosis of obesity and her BMI today is 30.2 Julie Moran is in the action stage of change   ADVISE: Julie Moran was educated on the multiple health risks of obesity as well as the benefit of weight loss to improve her health. She was advised of the need for long term treatment and the importance of lifestyle modifications to improve her current health and to decrease her risk of future health problems.  AGREE: Multiple dietary modification options and treatment options were discussed and  Julie Moran agreed to follow the recommendations documented in the above note.  ARRANGE: Julie Moran was educated on the importance of frequent visits to treat obesity as outlined per CMS and USPSTF guidelines and agreed to schedule her next follow up appointment today.  Julie Moran, am acting as transcriptionist for [employer], DO  I have reviewed the above documentation for accuracy and completeness, and I agree with the above. -Jearld Lesch, DO

## 2018-08-19 ENCOUNTER — Ambulatory Visit (INDEPENDENT_AMBULATORY_CARE_PROVIDER_SITE_OTHER): Payer: BC Managed Care – PPO | Admitting: Family Medicine

## 2018-08-19 ENCOUNTER — Encounter (INDEPENDENT_AMBULATORY_CARE_PROVIDER_SITE_OTHER): Payer: Self-pay

## 2018-08-19 ENCOUNTER — Ambulatory Visit (INDEPENDENT_AMBULATORY_CARE_PROVIDER_SITE_OTHER): Payer: Self-pay | Admitting: Family Medicine

## 2018-08-19 VITALS — BP 115/74 | HR 66 | Temp 97.8°F | Ht 64.0 in | Wt 173.0 lb

## 2018-08-19 DIAGNOSIS — Z6830 Body mass index (BMI) 30.0-30.9, adult: Secondary | ICD-10-CM | POA: Diagnosis not present

## 2018-08-19 DIAGNOSIS — E669 Obesity, unspecified: Secondary | ICD-10-CM

## 2018-08-19 DIAGNOSIS — I10 Essential (primary) hypertension: Secondary | ICD-10-CM

## 2018-08-19 DIAGNOSIS — E559 Vitamin D deficiency, unspecified: Secondary | ICD-10-CM | POA: Diagnosis not present

## 2018-08-20 NOTE — Progress Notes (Signed)
Office: [phone]  /  Fax: [phone]   HPI:   Chief Complaint: OBESITY Julie Moran is here to discuss her progress with her obesity treatment plan. She is on the keep a food journal with 300-400 calories and 30+ grams of protein at lunch daily and follow the Category 1 plan and is following her eating plan approximately 98 % of the time. She states she is walking for 30+ minutes 5 times per week. Julie Moran indulged in ice cream 1 time. She is consistently journaling and hitting goals. She notes hunger is controlled  Her weight is 173 lb (78.5 kg) today and has had a weight loss of 3 pounds over a period of 2 weeks since her last visit. She has lost 10 lbs since starting treatment with [place].  Hypertension Julie Moran is a 62 y.o. female with hypertension. Julie Moran's blood pressure is controlled. She denies chest pain, chest pressure, or headache. She is working weight loss to help control her blood pressure with the goal of decreasing her risk of heart attack and stroke.   Vitamin D Deficiency Julie Moran has a diagnosis of vitamin D deficiency. She is currently taking prescription Vit D. She notes fatigue and denies nausea, vomiting or muscle weakness.  ALLERGIES: Allergies  Allergen Reactions  . No Known Allergies     MEDICATIONS: Current Outpatient Medications on File Prior to Visit  Medication Sig Dispense Refill  . amLODipine (NORVASC) 5 MG tablet Take 1 tablet by mouth Daily.     . Nutritional Supplements (JUICE PLUS FIBRE PO) Take 2 tablets by mouth 3 (three) times daily.    . pravastatin (PRAVACHOL) 20 MG tablet     . Vitamin D, Ergocalciferol, (DRISDOL) 50000 units CAPS capsule Take 1 capsule (50,000 Units total) by mouth every 7 (seven) days. 4 capsule 0   No current facility-administered medications on file prior to visit.     PAST MEDICAL HISTORY: Past Medical History:  Diagnosis Date  . Allergy   . Atrophic vaginitis   . Birt-Hogg-Dube syndrome    lung cysts   .  Birt-Hogg-Dube syndrome   . Chest pain   . Colon polyps    adenomatous  . Constipation   . Depression   . Diverticulosis   . Dry skin   . Fatty liver   . Floaters   . Gall bladder disease   . Glaucoma   . Heart murmur   . Herpes simplex   . Hyperlipidemia   . Hypertension   . Joint pain   . Lichen sclerosus   . Pancreatitis   . Pancreatitis   . Pneumothorax, right    spontaneous  . Shortness of breath   . Shortness of breath on exertion   . Swelling of both lower extremities   . Trouble in sleeping     PAST SURGICAL HISTORY: Past Surgical History:  Procedure Laterality Date  . BREAST BIOPSY  03/29/2006  . BREAST BIOPSY Bilateral 06/14/1998  . BREAST EXCISIONAL BIOPSY Bilateral 1999  . BREAST LUMPECTOMY  [phone]   x5 times total , both breasts  . CHOLECYSTECTOMY    . COLONOSCOPY    . hysterctomy     abdominal  . POLYPECTOMY    . right ovary removed    . UPPER GASTROINTESTINAL ENDOSCOPY      SOCIAL HISTORY: Social History   Tobacco Use  . Smoking status: Never Smoker  . Smokeless tobacco: Never Used  Substance Use Topics  . Alcohol use: No    Alcohol/week:  0.0 standard drinks  . Drug use: No    FAMILY HISTORY: Family History  Problem Relation Age of Onset  . Diabetes Mother   . Breast cancer Mother   . CVA Mother   . Hypertension Mother   . Heart disease Mother   . Thyroid disease Mother   . Cancer Mother   . Diabetes Father   . Prostate cancer Father   . Heart disease Father   . Hypertension Father   . Hyperlipidemia Father   . Thyroid disease Father   . Cancer Father   . Obesity Father   . Liver disease Paternal Grandmother   . Breast cancer Maternal Aunt        x 3  . CVA Paternal Grandfather   . Colon cancer Neg Hx   . Esophageal cancer Neg Hx   . Stomach cancer Neg Hx   . Rectal cancer Neg Hx     ROS: Review of Systems  Constitutional: Positive for malaise/fatigue and weight loss.  Cardiovascular: Negative for chest  pain.       Negative chest pressure  Gastrointestinal: Negative for nausea and vomiting.  Musculoskeletal:       Negative muscle weakness  Neurological: Negative for headaches.    PHYSICAL EXAM: Blood pressure 115/74, pulse 66, temperature 97.8 F (36.6 C), temperature source Oral, height 5\' 4"  (1.626 m), weight 173 lb (78.5 kg), SpO2 97 %. Body mass index is 29.7 kg/m. Physical Exam  Constitutional: She is oriented to person, place, and time. She appears well-developed and well-nourished.  Cardiovascular: Normal rate.  Pulmonary/Chest: Effort normal.  Musculoskeletal: Normal range of motion.  Neurological: She is oriented to person, place, and time.  Skin: Skin is warm and dry.  Psychiatric: She has a normal mood and affect. Her behavior is normal.  Vitals reviewed.   RECENT LABS AND TESTS: BMET    Component Value Date/Time   NA 137 07/03/2018 1105   K 4.3 07/03/2018 1105   CL 99 07/03/2018 1105   CO2 23 07/03/2018 1105   GLUCOSE 110 (H) 07/03/2018 1105   GLUCOSE 106 (H) 07/02/2012 0510   BUN 17 07/03/2018 1105   CREATININE 1.04 (H) 07/03/2018 1105   CALCIUM 9.6 07/03/2018 1105   GFRNONAA 57 (L) 07/03/2018 1105   GFRAA 66 07/03/2018 1105   No results found for: HGBA1C Lab Results  Component Value Date   INSULIN 16.4 07/03/2018   CBC    Component Value Date/Time   WBC 7.8 07/02/2012 0510   RBC 4.79 07/02/2012 0510   HGB 14.4 07/02/2012 0510   HCT 40.9 07/02/2012 0510   PLT 165 07/02/2012 0510   MCV 85.4 07/02/2012 0510   MCH 30.1 07/02/2012 0510   MCHC 35.2 07/02/2012 0510   RDW 12.9 07/02/2012 0510   LYMPHSABS 2.7 12/27/2008 1722   MONOABS 0.4 12/27/2008 1722   EOSABS 0.1 12/27/2008 1722   BASOSABS 0.0 12/27/2008 1722   Iron/TIBC/Ferritin/ %Sat No results found for: IRON, TIBC, FERRITIN, IRONPCTSAT Lipid Panel  No results found for: CHOL, TRIG, HDL, CHOLHDL, VLDL, LDLCALC, LDLDIRECT Hepatic Function Panel     Component Value Date/Time   PROT 6.8  07/03/2018 1105   ALBUMIN 4.6 07/03/2018 1105   AST 29 07/03/2018 1105   ALT 51 (H) 07/03/2018 1105   ALKPHOS 100 07/03/2018 1105   BILITOT 0.5 07/03/2018 1105   BILIDIR <0.1 07/02/2012 0521   IBILI NOT CALCULATED 07/02/2012 0521      Component Value Date/Time   TSH 1.470  07/03/2018 1105  Results for EDELL, MESENBRINK (MRN [id number]) as of 08/20/2018 16:27  Ref. Range 07/03/2018 11:05  Vitamin D, 25-Hydroxy Latest Ref Range: 30.0 - 100.0 ng/mL 22.3 (L)    ASSESSMENT AND PLAN: Essential hypertension  Vitamin D deficiency  Class 1 obesity with serious comorbidity and body mass index (BMI) of 30.0 to 30.9 in adult, unspecified obesity type  PLAN:  Hypertension We discussed sodium restriction, working on healthy weight loss, and a regular exercise program as the means to achieve improved blood pressure control. Julie Moran agreed with this plan and agreed to follow up as directed. We will continue to monitor her blood pressure as well as her progress with the above lifestyle modifications. Julie Moran agrees to continue taking amlodipine and will watch for signs of hypotension as she continues her lifestyle modifications. Julie Moran agrees to follow up with our clinic in 2 weeks.  Vitamin D Deficiency Julie Moran was informed that low vitamin D levels contributes to fatigue and are associated with obesity, breast, and colon cancer. Julie Moran agrees to continue taking prescription Vit D @50 ,000 IU every week, no refill needed. She will follow up for routine testing of vitamin D, at least 2-3 times per year. She was informed of the risk of over-replacement of vitamin D and agrees to not increase her dose unless she discusses this with [place] first. Julie Moran agrees to follow up with our clinic in 2 weeks.  I spent > than 50% of the 15 minute visit on counseling as documented in the note.  Obesity Julie Moran is currently in the action stage of change. As such, her goal is to continue with weight loss efforts She has  agreed to keep a food journal with 1000-1100 calories and 80 grams of protein daily or follow the Category 1 plan Julie Moran has been instructed to work up to a goal of 150 minutes of combined cardio and strengthening exercise per week for weight loss and overall health benefits. We discussed the following Behavioral Modification Strategies today: increasing lean protein intake, work on meal planning and easy cooking plans, and planning for success   Julie Moran has agreed to follow up with our clinic in 2 weeks. She was informed of the importance of frequent follow up visits to maximize her success with intensive lifestyle modifications for her multiple health conditions.   OBESITY BEHAVIORAL INTERVENTION VISIT  Today's visit was # 4   Starting weight: 183 lbs Starting date: 07/03/18 Today's weight : 173 lbs  Today's date: 08/19/2018 Total lbs lost to date: 10    ASK: We discussed the diagnosis of obesity with Julie Moran today and Julie Moran agreed to give [place] permission to discuss obesity behavioral modification therapy today.  ASSESS: Julie Moran has the diagnosis of obesity and her BMI today is 29.68 Julie Moran is in the action stage of change   ADVISE: Julie Moran was educated on the multiple health risks of obesity as well as the benefit of weight loss to improve her health. She was advised of the need for long term treatment and the importance of lifestyle modifications to improve her current health and to decrease her risk of future health problems.  AGREE: Multiple dietary modification options and treatment options were discussed and  Julie Moran agreed to follow the recommendations documented in the above note.  ARRANGE: Julie Moran was educated on the importance of frequent visits to treat obesity as outlined per CMS and USPSTF guidelines and agreed to schedule her next follow up appointment today.  Wilhemena Durie, am acting  as transcriptionist for Ilene Qua, MD  I have reviewed the above  documentation for accuracy and completeness, and I agree with the above. - Ilene Qua, MD

## 2018-09-02 ENCOUNTER — Ambulatory Visit (INDEPENDENT_AMBULATORY_CARE_PROVIDER_SITE_OTHER): Payer: BC Managed Care – PPO | Admitting: Family Medicine

## 2018-09-02 VITALS — BP 110/75 | HR 63 | Temp 97.8°F | Ht 64.0 in | Wt 173.0 lb

## 2018-09-02 DIAGNOSIS — E669 Obesity, unspecified: Secondary | ICD-10-CM

## 2018-09-02 DIAGNOSIS — Z6830 Body mass index (BMI) 30.0-30.9, adult: Secondary | ICD-10-CM

## 2018-09-02 DIAGNOSIS — E559 Vitamin D deficiency, unspecified: Secondary | ICD-10-CM

## 2018-09-02 DIAGNOSIS — I10 Essential (primary) hypertension: Secondary | ICD-10-CM | POA: Diagnosis not present

## 2018-09-02 DIAGNOSIS — Z9189 Other specified personal risk factors, not elsewhere classified: Secondary | ICD-10-CM

## 2018-09-02 MED ORDER — VITAMIN D (ERGOCALCIFEROL) 1.25 MG (50000 UNIT) PO CAPS: 50000.0000 [IU] | ORAL_CAPSULE | ORAL | 0 refills | Status: DC

## 2018-09-03 NOTE — Progress Notes (Signed)
Office: [phone]  /  Fax: [phone]   HPI:   Chief Complaint: OBESITY Julie Moran is here to discuss her progress with her obesity treatment plan. She is on the  keep a food journal with 1000-1100 calories and 80+ g of protein  and is following her eating plan approximately 60 % of the time. She states she is exercising 0 minutes 0 times per week. Julie Moran that when she was able to get in 80 grams of protein she would go over 1100 calories. She was in [place] on vacation and did skip a few meals. She was not consistently walking while on vacation.  Her weight is 173 lb (78.5 kg) today and has not lost weight since her last visit. She has lost 10 lbs since starting treatment with [place].  Vitamin D deficiency Julie Moran has a diagnosis of vitamin D deficiency. She is currently taking vit D and denies nausea, vomiting or muscle weakness. She does report fatigue.    Ref. Range 07/03/2018 11:05  Vitamin D, 25-Hydroxy Latest Ref Range: 30.0 - 100.0 ng/mL 22.3 (L)   Hypertension Julie Moran is a 62 y.o. female with hypertension.  Julie Moran denies chest pain or shortness/pressure of breath on exertion. She is working weight loss to help control her blood pressure with the goal of decreasing her risk of heart attack and stroke. She is currently taking amlodipine 5 mg daily. Julie Moran blood pressure is currently controlled.  At risk for osteopenia and osteoporosis Julie Moran is at higher risk of osteopenia and osteoporosis due to vitamin D deficiency.   ALLERGIES: Allergies  Allergen Reactions  . No Known Allergies     MEDICATIONS: Current Outpatient Medications on File Prior to Visit  Medication Sig Dispense Refill  . amLODipine (NORVASC) 5 MG tablet Take 1 tablet by mouth Daily.     . Nutritional Supplements (JUICE PLUS FIBRE PO) Take 2 tablets by mouth 3 (three) times daily.    . pravastatin (PRAVACHOL) 20 MG tablet      No current facility-administered medications on file prior  to visit.     PAST MEDICAL HISTORY: Past Medical History:  Diagnosis Date  . Allergy   . Atrophic vaginitis   . Birt-Hogg-Dube syndrome    lung cysts   . Birt-Hogg-Dube syndrome   . Chest pain   . Colon polyps    adenomatous  . Constipation   . Depression   . Diverticulosis   . Dry skin   . Fatty liver   . Floaters   . Gall bladder disease   . Glaucoma   . Heart murmur   . Herpes simplex   . Hyperlipidemia   . Hypertension   . Joint pain   . Lichen sclerosus   . Pancreatitis   . Pancreatitis   . Pneumothorax, right    spontaneous  . Shortness of breath   . Shortness of breath on exertion   . Swelling of both lower extremities   . Trouble in sleeping     PAST SURGICAL HISTORY: Past Surgical History:  Procedure Laterality Date  . BREAST BIOPSY  03/29/2006  . BREAST BIOPSY Bilateral 06/14/1998  . BREAST EXCISIONAL BIOPSY Bilateral 1999  . BREAST LUMPECTOMY  [phone]   x5 times total , both breasts  . CHOLECYSTECTOMY    . COLONOSCOPY    . hysterctomy     abdominal  . POLYPECTOMY    . right ovary removed    . UPPER GASTROINTESTINAL ENDOSCOPY  SOCIAL HISTORY: Social History   Tobacco Use  . Smoking status: Never Smoker  . Smokeless tobacco: Never Used  Substance Use Topics  . Alcohol use: No    Alcohol/week: 0.0 standard drinks  . Drug use: No    FAMILY HISTORY: Family History  Problem Relation Age of Onset  . Diabetes Mother   . Breast cancer Mother   . CVA Mother   . Hypertension Mother   . Heart disease Mother   . Thyroid disease Mother   . Cancer Mother   . Diabetes Father   . Prostate cancer Father   . Heart disease Father   . Hypertension Father   . Hyperlipidemia Father   . Thyroid disease Father   . Cancer Father   . Obesity Father   . Liver disease Paternal Grandmother   . Breast cancer Maternal Aunt        x 3  . CVA Paternal Grandfather   . Colon cancer Neg Hx   . Esophageal cancer Neg Hx   . Stomach cancer Neg  Hx   . Rectal cancer Neg Hx     ROS: Review of Systems  Constitutional: Positive for malaise/fatigue. Negative for weight loss.  Respiratory: Negative for shortness of breath.   Cardiovascular: Negative for chest pain.       Negative for chest pressure   Gastrointestinal: Negative for nausea and vomiting.  Musculoskeletal:       Negative for muscle weakness    PHYSICAL EXAM: Blood pressure 110/75, pulse 63, temperature 97.8 F (36.6 C), temperature source Oral, height 5\' 4"  (1.626 m), weight 173 lb (78.5 kg), SpO2 98 %. Body mass index is 29.7 kg/m. Physical Exam  Constitutional: She is oriented to person, place, and time. She appears well-developed and well-nourished.  HENT:  Head: Normocephalic.  Cardiovascular: Normal rate.  Pulmonary/Chest: Effort normal.  Musculoskeletal: Normal range of motion.  Neurological: She is oriented to person, place, and time.  Skin: Skin is warm and dry.  Psychiatric: She has a normal mood and affect. Her behavior is normal.  Vitals reviewed.   RECENT LABS AND TESTS: BMET    Component Value Date/Time   NA 137 07/03/2018 1105   K 4.3 07/03/2018 1105   CL 99 07/03/2018 1105   CO2 23 07/03/2018 1105   GLUCOSE 110 (H) 07/03/2018 1105   GLUCOSE 106 (H) 07/02/2012 0510   BUN 17 07/03/2018 1105   CREATININE 1.04 (H) 07/03/2018 1105   CALCIUM 9.6 07/03/2018 1105   GFRNONAA 57 (L) 07/03/2018 1105   GFRAA 66 07/03/2018 1105   No results Moran for: HGBA1C Lab Results  Component Value Date   INSULIN 16.4 07/03/2018   CBC    Component Value Date/Time   WBC 7.8 07/02/2012 0510   RBC 4.79 07/02/2012 0510   HGB 14.4 07/02/2012 0510   HCT 40.9 07/02/2012 0510   PLT 165 07/02/2012 0510   MCV 85.4 07/02/2012 0510   MCH 30.1 07/02/2012 0510   MCHC 35.2 07/02/2012 0510   RDW 12.9 07/02/2012 0510   LYMPHSABS 2.7 12/27/2008 1722   MONOABS 0.4 12/27/2008 1722   EOSABS 0.1 12/27/2008 1722   BASOSABS 0.0 12/27/2008 1722    Iron/TIBC/Ferritin/ %Sat No results Moran for: IRON, TIBC, FERRITIN, IRONPCTSAT Lipid Panel  No results Moran for: CHOL, TRIG, HDL, CHOLHDL, VLDL, LDLCALC, LDLDIRECT Hepatic Function Panel     Component Value Date/Time   PROT 6.8 07/03/2018 1105   ALBUMIN 4.6 07/03/2018 1105   AST 29 07/03/2018  1105   ALT 51 (H) 07/03/2018 1105   ALKPHOS 100 07/03/2018 1105   BILITOT 0.5 07/03/2018 1105   BILIDIR <0.1 07/02/2012 0521   IBILI NOT CALCULATED 07/02/2012 0521      Component Value Date/Time   TSH 1.470 07/03/2018 1105     Ref. Range 07/03/2018 11:05  Vitamin D, 25-Hydroxy Latest Ref Range: 30.0 - 100.0 ng/mL 22.3 (L)   ASSESSMENT AND PLAN: Vitamin D deficiency - Plan: Vitamin D, Ergocalciferol, (DRISDOL) 50000 units CAPS capsule  Essential hypertension  At risk for osteoporosis  Class 1 obesity with serious comorbidity and body mass index (BMI) of 30.0 to 30.9 in adult, unspecified obesity type - beginning BMI >30  PLAN: Vitamin D Deficiency Julie Moran was informed that low vitamin D levels contributes to fatigue and are associated with obesity, breast, and colon cancer. She agrees to continue to take prescription Vit D @50 ,000 IU every week #4 with no refills and will follow up for routine testing of vitamin D, at least 2-3 times per year. She was informed of the risk of over-replacement of vitamin D and agrees to not increase her dose unless she discusses this with [place] first. She agrees to follow up in our clinic as directed.   Hypertension We discussed sodium restriction, working on healthy weight loss, and a regular exercise program as the means to achieve improved blood pressure control. Julie Moran agreed with this plan and agreed to follow up as directed. We will continue to monitor her blood pressure as well as her progress with the above lifestyle modifications. She will continue her medications as prescribed and will watch for signs of hypotension as she continues her lifestyle  modifications.  At risk for osteopenia and osteoporosis Julie Moran was given extended  (15 minutes) osteoporosis prevention counseling today. Julie Moran is at risk for osteopenia and osteoporosis due to her vitamin D deficiency. She was encouraged to take her vitamin D and follow her higher calcium diet and increase strengthening exercise to help strengthen her bones and decrease her risk of osteopenia and osteoporosis.  Obesity Julie Moran is currently in the action stage of change. As such, her goal is to continue with weight loss efforts She has agreed to keep a food journal with 1000-1100 calories and 80+ g of  protein  Julie Moran has been instructed to work up to a goal of 150 minutes of combined cardio and strengthening exercise per week for weight loss and overall health benefits. Discussed with her to make a plan to get back to walking 3-4 times a week.  We discussed the following Behavioral Modification Strategies today: increasing lean protein intake, increasing vegetables, no skipping meals, planning for success, and keeping a strict food journal.    Julie Moran has agreed to follow up with our clinic in 2 weeks. She was informed of the importance of frequent follow up visits to maximize her success with intensive lifestyle modifications for her multiple health conditions.   OBESITY BEHAVIORAL INTERVENTION VISIT  Today's visit was # 5   Starting weight: 183 lb Starting date: 07/03/18 Today's weight : 173 lb Today's date: 09/02/18 Total lbs lost to date: 10 lb    ASK: We discussed the diagnosis of obesity with Julie Moran today and Julie Moran agreed to give [place] permission to discuss obesity behavioral modification therapy today.  ASSESS: Juletta has the diagnosis of obesity and her BMI today is 29.68 Bernadean is in the action stage of change   ADVISE: Tvisha was educated on the multiple health risks  of obesity as well as the benefit of weight loss to improve her health. She was advised of the  need for long term treatment and the importance of lifestyle modifications to improve her current health and to decrease her risk of future health problems.  AGREE: Multiple dietary modification options and treatment options were discussed and  Jailee agreed to follow the recommendations documented in the above note.  ARRANGE: Sofi was educated on the importance of frequent visits to treat obesity as outlined per CMS and USPSTF guidelines and agreed to schedule her next follow up appointment today.  I, Renee Ramus, am acting as transcriptionist for Ilene Qua, MD   I have reviewed the above documentation for accuracy and completeness, and I agree with the above. - Ilene Qua, MD

## 2018-09-16 ENCOUNTER — Ambulatory Visit (INDEPENDENT_AMBULATORY_CARE_PROVIDER_SITE_OTHER): Payer: BC Managed Care – PPO | Admitting: Family Medicine

## 2018-09-16 VITALS — BP 116/79 | HR 64 | Temp 97.6°F | Ht 64.0 in | Wt 169.0 lb

## 2018-09-16 DIAGNOSIS — Z9189 Other specified personal risk factors, not elsewhere classified: Secondary | ICD-10-CM | POA: Diagnosis not present

## 2018-09-16 DIAGNOSIS — Z6830 Body mass index (BMI) 30.0-30.9, adult: Secondary | ICD-10-CM

## 2018-09-16 DIAGNOSIS — E559 Vitamin D deficiency, unspecified: Secondary | ICD-10-CM

## 2018-09-16 DIAGNOSIS — I10 Essential (primary) hypertension: Secondary | ICD-10-CM | POA: Diagnosis not present

## 2018-09-16 DIAGNOSIS — E669 Obesity, unspecified: Secondary | ICD-10-CM

## 2018-09-17 NOTE — Progress Notes (Signed)
Office: [phone]  /  Fax: [phone]   HPI:   Chief Complaint: OBESITY Julie Moran is here to discuss her progress with her obesity treatment plan. She is keeping a food journal with 1000 to 1100 calories and 80+ grams of protein and is following her eating plan approximately 70 % of the time. She states she is walking 60 minutes 4 to 5 times per week. Julie Moran brought her written journal with calories and protein. Most days she is hitting her protein goal. There were a few days that she was over on calories.  Her weight is 169 lb (76.7 kg) today and has had a weight loss of 4 pounds over a period of 2 weeks since her last visit. She has lost 14 lbs since starting treatment with [place].  Hypertension Julie Moran is a 62 y.o. female with hypertension. She is working on weight loss to help control her blood pressure with the goal of decreasing her risk of heart attack and stroke. Julie Moran's blood pressure is currently controlled today. She denies chest pain, chest pressure, and headaches.  At risk for cardiovascular disease Julie Moran is at a higher than average risk for cardiovascular disease due to hypertension and obesity. She currently denies any chest pain.  Vitamin D deficiency Julie Moran has a diagnosis of vitamin D deficiency. She is currently taking vit D. She admits fatigue and denies nausea, vomiting or muscle weakness.  ALLERGIES: Allergies  Allergen Reactions  . No Known Allergies     MEDICATIONS: Current Outpatient Medications on File Prior to Visit  Medication Sig Dispense Refill  . amLODipine (NORVASC) 5 MG tablet Take 1 tablet by mouth Daily. Take 1/2 daily    . Nutritional Supplements (JUICE PLUS FIBRE PO) Take 2 tablets by mouth 3 (three) times daily.    . pravastatin (PRAVACHOL) 20 MG tablet     . Vitamin D, Ergocalciferol, (DRISDOL) 50000 units CAPS capsule Take 1 capsule (50,000 Units total) by mouth every 7 (seven) days. 4 capsule 0   No current facility-administered  medications on file prior to visit.     PAST MEDICAL HISTORY: Past Medical History:  Diagnosis Date  . Allergy   . Atrophic vaginitis   . Birt-Hogg-Dube syndrome    lung cysts   . Birt-Hogg-Dube syndrome   . Chest pain   . Colon polyps    adenomatous  . Constipation   . Depression   . Diverticulosis   . Dry skin   . Fatty liver   . Floaters   . Gall bladder disease   . Glaucoma   . Heart murmur   . Herpes simplex   . Hyperlipidemia   . Hypertension   . Joint pain   . Lichen sclerosus   . Pancreatitis   . Pancreatitis   . Pneumothorax, right    spontaneous  . Shortness of breath   . Shortness of breath on exertion   . Swelling of both lower extremities   . Trouble in sleeping     PAST SURGICAL HISTORY: Past Surgical History:  Procedure Laterality Date  . BREAST BIOPSY  03/29/2006  . BREAST BIOPSY Bilateral 06/14/1998  . BREAST EXCISIONAL BIOPSY Bilateral 1999  . BREAST LUMPECTOMY  [phone]   x5 times total , both breasts  . CHOLECYSTECTOMY    . COLONOSCOPY    . hysterctomy     abdominal  . POLYPECTOMY    . right ovary removed    . UPPER GASTROINTESTINAL ENDOSCOPY      SOCIAL  HISTORY: Social History   Tobacco Use  . Smoking status: Never Smoker  . Smokeless tobacco: Never Used  Substance Use Topics  . Alcohol use: No    Alcohol/week: 0.0 standard drinks  . Drug use: No    FAMILY HISTORY: Family History  Problem Relation Age of Onset  . Diabetes Mother   . Breast cancer Mother   . CVA Mother   . Hypertension Mother   . Heart disease Mother   . Thyroid disease Mother   . Cancer Mother   . Diabetes Father   . Prostate cancer Father   . Heart disease Father   . Hypertension Father   . Hyperlipidemia Father   . Thyroid disease Father   . Cancer Father   . Obesity Father   . Liver disease Paternal Grandmother   . Breast cancer Maternal Aunt        x 3  . CVA Paternal Grandfather   . Colon cancer Neg Hx   . Esophageal cancer Neg  Hx   . Stomach cancer Neg Hx   . Rectal cancer Neg Hx     ROS: Review of Systems  Constitutional: Positive for malaise/fatigue and weight loss.  Cardiovascular: Negative for chest pain.       Negative for chest pressure.  Gastrointestinal: Negative for nausea and vomiting.  Musculoskeletal:       Negative for muscle weakness.  Neurological: Negative for headaches.    PHYSICAL EXAM: Blood pressure 116/79, pulse 64, temperature 97.6 F (36.4 C), temperature source Oral, height 5\' 4"  (1.626 m), weight 169 lb (76.7 kg), SpO2 97 %. Body mass index is 29.01 kg/m. Physical Exam  Constitutional: She is oriented to person, place, and time. She appears well-developed and well-nourished.  Cardiovascular: Normal rate.  Pulmonary/Chest: Effort normal.  Musculoskeletal: Normal range of motion.  Neurological: She is oriented to person, place, and time.  Skin: Skin is warm and dry.  Psychiatric: She has a normal mood and affect. Her behavior is normal.  Vitals reviewed.   RECENT LABS AND TESTS: BMET    Component Value Date/Time   NA 137 07/03/2018 1105   K 4.3 07/03/2018 1105   CL 99 07/03/2018 1105   CO2 23 07/03/2018 1105   GLUCOSE 110 (H) 07/03/2018 1105   GLUCOSE 106 (H) 07/02/2012 0510   BUN 17 07/03/2018 1105   CREATININE 1.04 (H) 07/03/2018 1105   CALCIUM 9.6 07/03/2018 1105   GFRNONAA 57 (L) 07/03/2018 1105   GFRAA 66 07/03/2018 1105   No results found for: HGBA1C Lab Results  Component Value Date   INSULIN 16.4 07/03/2018   CBC    Component Value Date/Time   WBC 7.8 07/02/2012 0510   RBC 4.79 07/02/2012 0510   HGB 14.4 07/02/2012 0510   HCT 40.9 07/02/2012 0510   PLT 165 07/02/2012 0510   MCV 85.4 07/02/2012 0510   MCH 30.1 07/02/2012 0510   MCHC 35.2 07/02/2012 0510   RDW 12.9 07/02/2012 0510   LYMPHSABS 2.7 12/27/2008 1722   MONOABS 0.4 12/27/2008 1722   EOSABS 0.1 12/27/2008 1722   BASOSABS 0.0 12/27/2008 1722   Iron/TIBC/Ferritin/ %Sat No results  found for: IRON, TIBC, FERRITIN, IRONPCTSAT Lipid Panel  No results found for: CHOL, TRIG, HDL, CHOLHDL, VLDL, LDLCALC, LDLDIRECT Hepatic Function Panel     Component Value Date/Time   PROT 6.8 07/03/2018 1105   ALBUMIN 4.6 07/03/2018 1105   AST 29 07/03/2018 1105   ALT 51 (H) 07/03/2018 1105   ALKPHOS  100 07/03/2018 1105   BILITOT 0.5 07/03/2018 1105   BILIDIR <0.1 07/02/2012 0521   IBILI NOT CALCULATED 07/02/2012 0521      Component Value Date/Time   TSH 1.470 07/03/2018 1105   Results for MURRY, KHIEV (MRN [id number]) as of 09/17/2018 12:02  Ref. Range 07/03/2018 11:05  Vitamin D, 25-Hydroxy Latest Ref Range: 30.0 - 100.0 ng/mL 22.3 (L)   ASSESSMENT AND PLAN: Essential hypertension  Vitamin D deficiency  At risk for heart disease  Class 1 obesity with serious comorbidity and body mass index (BMI) of 30.0 to 30.9 in adult, unspecified obesity type - Starting BMI greater then 30  PLAN:  Hypertension We discussed sodium restriction, working on healthy weight loss, and a regular exercise program as the means to achieve improved blood pressure control. We will continue to monitor her blood pressure as well as her progress with the above lifestyle modifications. Julie Moran agreed to decrease amlodipine to 2.5mg  PO daily (she will cut pills in half) and will watch for signs of hypotension as she continues her lifestyle modifications. Julie Moran agreed with this plan and agreed to follow up as directed in 2 weeks.  Cardiovascular risk counseling Julie Moran was given extended (15 minutes) coronary artery disease prevention counseling today. She is 62 y.o. female and has risk factors for heart disease including hypertension and obesity. We discussed intensive lifestyle modifications today with an emphasis on specific weight loss instructions and strategies. Pt was also informed of the importance of increasing exercise and decreasing saturated fats to help prevent heart disease.  Vitamin D  Deficiency Julie Moran was informed that low vitamin D levels contributes to fatigue and are associated with obesity, breast, and colon cancer. She agrees to continue to take prescription Vit D @50 ,000 IU every week with no refill needed and will follow up for routine testing of vitamin D, at least 2-3 times per year. She was informed of the risk of over-replacement of vitamin D and agrees to not increase her dose unless she discusses this with [place] first. She agrees to follow up as directed.  Obesity Julie Moran is currently in the action stage of change. As such, her goal is to continue with weight loss efforts. She has agreed to keep a food journal with 1000 to 1100 calories and 80 grams of protein daily. Julie Moran has been instructed to work up to a goal of 150 minutes of combined cardio and strengthening exercise per week for weight loss and overall health benefits. We discussed the following Behavioral Modification Strategies today: increasing lean protein intake, increasing vegetables, planning for success, and keeping a strict food journal.  Julie Moran has agreed to follow up with our clinic in 2 weeks. She was informed of the importance of frequent follow up visits to maximize her success with intensive lifestyle modifications for her multiple health conditions.   OBESITY BEHAVIORAL INTERVENTION VISIT  Today's visit was # 6   Starting weight: 183 lbs Starting date: 07/03/18 Today's weight : Weight: 169 lb (76.7 kg)  Today's date: 09/16/2018 Total lbs lost to date: 14  ASK: We discussed the diagnosis of obesity with Julie Moran today and Julie Moran agreed to give [place] permission to discuss obesity behavioral modification therapy today.  ASSESS: Julie Moran has the diagnosis of obesity and her BMI today is 28.99. Julie Moran is in the action stage of change.   ADVISE: Julie Moran was educated on the multiple health risks of obesity as well as the benefit of weight loss to improve her health. She was  advised of the  need for long term treatment and the importance of lifestyle modifications to improve her current health and to decrease her risk of future health problems.  AGREE: Multiple dietary modification options and treatment options were discussed and Gizell agreed to follow the recommendations documented in the above note.  ARRANGE: Damilola was educated on the importance of frequent visits to treat obesity as outlined per CMS and USPSTF guidelines and agreed to schedule her next follow up appointment today.  I, Marcille Blanco, am acting as Location manager for Eber Jones, MD  I have reviewed the above documentation for accuracy and completeness, and I agree with the above. - Ilene Qua, MD

## 2018-09-30 ENCOUNTER — Ambulatory Visit (INDEPENDENT_AMBULATORY_CARE_PROVIDER_SITE_OTHER): Payer: BC Managed Care – PPO | Admitting: Bariatrics

## 2018-09-30 VITALS — BP 112/76 | HR 54 | Temp 97.9°F | Ht 64.0 in | Wt 171.0 lb

## 2018-09-30 DIAGNOSIS — I10 Essential (primary) hypertension: Secondary | ICD-10-CM

## 2018-09-30 DIAGNOSIS — Z6830 Body mass index (BMI) 30.0-30.9, adult: Secondary | ICD-10-CM | POA: Diagnosis not present

## 2018-09-30 DIAGNOSIS — E669 Obesity, unspecified: Secondary | ICD-10-CM | POA: Diagnosis not present

## 2018-09-30 DIAGNOSIS — E559 Vitamin D deficiency, unspecified: Secondary | ICD-10-CM | POA: Diagnosis not present

## 2018-10-06 NOTE — Progress Notes (Signed)
Office: [phone]  /  Fax: [phone]   HPI:   Chief Complaint: OBESITY Julie Moran is here to discuss her progress with her obesity treatment plan. She is on the keep a food journal with 1000 to 1100 calories and 80 grams of protein daily and is following her eating plan approximately 45 % of the time. She states she is walking 60 minutes 4 to 5 times per week. [place] went on vacation and she increased eating more carbohydrates and some sweets. She has occasional cravings for salty or sweet foods. Her hunger is currently controlled. Julie Moran has done well continuing to increase lean protein. Her weight is 171 lb (77.6 kg) today and has had a weight loss of 2 pounds over a period of 2 weeks since her last visit. She has lost 12 lbs since starting treatment with [place].  Hypertension Julie Moran is a 62 y.o. female with hypertension. Myeasha Ballowe Delehanty denies lightheadedness. She is working weight loss to help control her blood pressure with the goal of decreasing her risk of heart attack and stroke. Sherrys blood pressure is well controlled on amlodipine half tablet (5 mg) total 2.5 mg. She has been gradually decreasing the dose..  Vitamin D deficiency Chai has a diagnosis of vitamin D deficiency. She is currently taking high dose vit D and denies nausea, vomiting or muscle weakness.  ALLERGIES: Allergies  Allergen Reactions  . No Known Allergies     MEDICATIONS: Current Outpatient Medications on File Prior to Visit  Medication Sig Dispense Refill  . amLODipine (NORVASC) 5 MG tablet Take 1 tablet by mouth Daily. Take 1/2 daily    . Nutritional Supplements (JUICE PLUS FIBRE PO) Take 2 tablets by mouth 3 (three) times daily.    . pravastatin (PRAVACHOL) 20 MG tablet     . Vitamin D, Ergocalciferol, (DRISDOL) 50000 units CAPS capsule Take 1 capsule (50,000 Units total) by mouth every 7 (seven) days. 4 capsule 0   No current facility-administered medications on file prior to visit.      PAST MEDICAL HISTORY: Past Medical History:  Diagnosis Date  . Allergy   . Atrophic vaginitis   . Birt-Hogg-Dube syndrome    lung cysts   . Birt-Hogg-Dube syndrome   . Chest pain   . Colon polyps    adenomatous  . Constipation   . Depression   . Diverticulosis   . Dry skin   . Fatty liver   . Floaters   . Gall bladder disease   . Glaucoma   . Heart murmur   . Herpes simplex   . Hyperlipidemia   . Hypertension   . Joint pain   . Lichen sclerosus   . Pancreatitis   . Pancreatitis   . Pneumothorax, right    spontaneous  . Shortness of breath   . Shortness of breath on exertion   . Swelling of both lower extremities   . Trouble in sleeping     PAST SURGICAL HISTORY: Past Surgical History:  Procedure Laterality Date  . BREAST BIOPSY  03/29/2006  . BREAST BIOPSY Bilateral 06/14/1998  . BREAST EXCISIONAL BIOPSY Bilateral 1999  . BREAST LUMPECTOMY  [phone]   x5 times total , both breasts  . CHOLECYSTECTOMY    . COLONOSCOPY    . hysterctomy     abdominal  . POLYPECTOMY    . right ovary removed    . UPPER GASTROINTESTINAL ENDOSCOPY      SOCIAL HISTORY: Social History   Tobacco Use  .  Smoking status: Never Smoker  . Smokeless tobacco: Never Used  Substance Use Topics  . Alcohol use: No    Alcohol/week: 0.0 standard drinks  . Drug use: No    FAMILY HISTORY: Family History  Problem Relation Age of Onset  . Diabetes Mother   . Breast cancer Mother   . CVA Mother   . Hypertension Mother   . Heart disease Mother   . Thyroid disease Mother   . Cancer Mother   . Diabetes Father   . Prostate cancer Father   . Heart disease Father   . Hypertension Father   . Hyperlipidemia Father   . Thyroid disease Father   . Cancer Father   . Obesity Father   . Liver disease Paternal Grandmother   . Breast cancer Maternal Aunt        x 3  . CVA Paternal Grandfather   . Colon cancer Neg Hx   . Esophageal cancer Neg Hx   . Stomach cancer Neg Hx   .  Rectal cancer Neg Hx     ROS: Review of Systems  Constitutional: Negative for weight loss.  Gastrointestinal: Negative for nausea and vomiting.  Musculoskeletal:       Negative for muscle weakness  Neurological:       Negative for lightheadedness  Endo/Heme/Allergies:       Negative for polyphagia Positive for cravings    PHYSICAL EXAM: Blood pressure 112/76, pulse (!) 54, temperature 97.9 F (36.6 C), temperature source Oral, height 5\' 4"  (1.626 m), weight 171 lb (77.6 kg), SpO2 98 %. Body mass index is 29.35 kg/m. Physical Exam  Constitutional: She is oriented to person, place, and time. She appears well-developed and well-nourished.  Cardiovascular: Normal rate.  Pulmonary/Chest: Effort normal.  Musculoskeletal: Normal range of motion.  Neurological: She is oriented to person, place, and time.  Skin: Skin is warm and dry.  Psychiatric: She has a normal mood and affect. Her behavior is normal.  Vitals reviewed.   RECENT LABS AND TESTS: BMET    Component Value Date/Time   NA 137 07/03/2018 1105   K 4.3 07/03/2018 1105   CL 99 07/03/2018 1105   CO2 23 07/03/2018 1105   GLUCOSE 110 (H) 07/03/2018 1105   GLUCOSE 106 (H) 07/02/2012 0510   BUN 17 07/03/2018 1105   CREATININE 1.04 (H) 07/03/2018 1105   CALCIUM 9.6 07/03/2018 1105   GFRNONAA 57 (L) 07/03/2018 1105   GFRAA 66 07/03/2018 1105   No results found for: HGBA1C Lab Results  Component Value Date   INSULIN 16.4 07/03/2018   CBC    Component Value Date/Time   WBC 7.8 07/02/2012 0510   RBC 4.79 07/02/2012 0510   HGB 14.4 07/02/2012 0510   HCT 40.9 07/02/2012 0510   PLT 165 07/02/2012 0510   MCV 85.4 07/02/2012 0510   MCH 30.1 07/02/2012 0510   MCHC 35.2 07/02/2012 0510   RDW 12.9 07/02/2012 0510   LYMPHSABS 2.7 12/27/2008 1722   MONOABS 0.4 12/27/2008 1722   EOSABS 0.1 12/27/2008 1722   BASOSABS 0.0 12/27/2008 1722   Iron/TIBC/Ferritin/ %Sat No results found for: IRON, TIBC, FERRITIN,  IRONPCTSAT Lipid Panel  No results found for: CHOL, TRIG, HDL, CHOLHDL, VLDL, LDLCALC, LDLDIRECT Hepatic Function Panel     Component Value Date/Time   PROT 6.8 07/03/2018 1105   ALBUMIN 4.6 07/03/2018 1105   AST 29 07/03/2018 1105   ALT 51 (H) 07/03/2018 1105   ALKPHOS 100 07/03/2018 1105   BILITOT  0.5 07/03/2018 1105   BILIDIR <0.1 07/02/2012 0521   IBILI NOT CALCULATED 07/02/2012 0521      Component Value Date/Time   TSH 1.470 07/03/2018 1105   Results for HADEN, CAVENAUGH (MRN [id number]) as of 10/06/2018 12:45  Ref. Range 07/03/2018 11:05  Vitamin D, 25-Hydroxy Latest Ref Range: 30.0 - 100.0 ng/mL 22.3 (L)   ASSESSMENT AND PLAN: Essential hypertension  Vitamin D deficiency  Class 1 obesity with serious comorbidity and body mass index (BMI) of 30.0 to 30.9 in adult, unspecified obesity type - BMI greater than 30 at start of program   PLAN:  Hypertension We discussed sodium restriction, working on healthy weight loss, and a regular exercise program as the means to achieve improved blood pressure control. Thuy agreed with this plan and agreed to follow up as directed. We will continue to monitor her blood pressure as well as her progress with the above lifestyle modifications. She will continue her amlodipine (5 mg) half tablet daily and will watch for signs of hypotension as she continues her lifestyle modifications.  Vitamin D Deficiency Julie Moran was informed that low vitamin D levels contributes to fatigue and are associated with obesity, breast, and colon cancer. She agrees to continue to take high dose prescription Vit D @50 ,000 IU every week and will follow up for routine testing of vitamin D, at least 2-3 times per year. She was informed of the risk of over-replacement of vitamin D and agrees to not increase her dose unless she discusses this with [place] first. We will check vitamin D level at the next visit.  I spent > than 50% of the 15 minute visit on counseling as  documented in the note.  Obesity Julie Moran is currently in the action stage of change. As such, her goal is to get back on track with weight loss efforts She has agreed to keep a food journal with 1000 to 1100 calories and 80 grams of protein daily Julie Moran has been instructed to continue walking for weight loss and overall health benefits. We discussed the following Behavioral Modification Stratagies today: increase H2O intake, increasing lean protein intake, decreasing simple carbohydrates  and increasing vegetables  Julie Moran has agreed to follow up with our clinic in 2 weeks fasting. She was informed of the importance of frequent follow up visits to maximize her success with intensive lifestyle modifications for her multiple health conditions.   OBESITY BEHAVIORAL INTERVENTION VISIT  Today's visit was # 7   Starting weight: 183 lbs Starting date: 07/03/18 Today's weight : 171 lbs  Today's date: 09/30/2018 Total lbs lost to date: 12   ASK: We discussed the diagnosis of obesity with Julie Moran today and Julie Moran agreed to give [place] permission to discuss obesity behavioral modification therapy today.  ASSESS: Hagen has the diagnosis of obesity and her BMI today is 29.34 Julie Moran is in the action stage of change   ADVISE: Tinita was educated on the multiple health risks of obesity as well as the benefit of weight loss to improve her health. She was advised of the need for long term treatment and the importance of lifestyle modifications to improve her current health and to decrease her risk of future health problems.  AGREE: Multiple dietary modification options and treatment options were discussed and  Maliaka agreed to follow the recommendations documented in the above note.  ARRANGE: Masayo was educated on the importance of frequent visits to treat obesity as outlined per CMS and USPSTF guidelines and agreed to schedule  her next follow up appointment today.  Corey Skains, am  acting as Location manager for [employer]. Owens Shark, DO  I have reviewed the above documentation for accuracy and completeness, and I agree with the above. -Jearld Lesch, DO

## 2018-10-15 ENCOUNTER — Ambulatory Visit (INDEPENDENT_AMBULATORY_CARE_PROVIDER_SITE_OTHER): Payer: BC Managed Care – PPO | Admitting: Family Medicine

## 2018-10-15 VITALS — BP 115/65 | HR 57 | Temp 97.6°F | Ht 64.0 in | Wt 167.0 lb

## 2018-10-15 DIAGNOSIS — E66811 Obesity, class 1: Secondary | ICD-10-CM

## 2018-10-15 DIAGNOSIS — E559 Vitamin D deficiency, unspecified: Secondary | ICD-10-CM | POA: Insufficient documentation

## 2018-10-15 DIAGNOSIS — E8881 Metabolic syndrome: Secondary | ICD-10-CM | POA: Insufficient documentation

## 2018-10-15 DIAGNOSIS — Z9189 Other specified personal risk factors, not elsewhere classified: Secondary | ICD-10-CM | POA: Diagnosis not present

## 2018-10-15 DIAGNOSIS — Z6830 Body mass index (BMI) 30.0-30.9, adult: Secondary | ICD-10-CM

## 2018-10-15 DIAGNOSIS — K760 Fatty (change of) liver, not elsewhere classified: Secondary | ICD-10-CM

## 2018-10-15 DIAGNOSIS — E88819 Insulin resistance, unspecified: Secondary | ICD-10-CM

## 2018-10-15 DIAGNOSIS — E669 Obesity, unspecified: Secondary | ICD-10-CM

## 2018-10-15 MED ORDER — VITAMIN D (ERGOCALCIFEROL) 1.25 MG (50000 UNIT) PO CAPS: 50000.0000 [IU] | ORAL_CAPSULE | ORAL | 0 refills | Status: DC

## 2018-10-16 LAB — COMPREHENSIVE METABOLIC PANEL
ALT: 29 IU/L (ref 0–32)
AST: 20 IU/L (ref 0–40)
Albumin/Globulin Ratio: 2.1 (ref 1.2–2.2)
Albumin: 4.5 g/dL (ref 3.6–4.8)
Alkaline Phosphatase: 99 IU/L (ref 39–117)
BUN/Creatinine Ratio: 20 (ref 12–28)
BUN: 21 mg/dL (ref 8–27)
Bilirubin Total: 0.5 mg/dL (ref 0.0–1.2)
CO2: 24 mmol/L (ref 20–29)
Calcium: 9.5 mg/dL (ref 8.7–10.3)
Chloride: 104 mmol/L (ref 96–106)
Creatinine, Ser: 1.04 mg/dL — ABNORMAL HIGH (ref 0.57–1.00)
GFR calc Af Amer: 66 mL/min/{1.73_m2} (ref >59)
GFR calc non Af Amer: 57 mL/min/{1.73_m2} — ABNORMAL LOW (ref >59)
Globulin, Total: 2.1 g/dL (ref 1.5–4.5)
Glucose: 110 mg/dL — ABNORMAL HIGH (ref 65–99)
Potassium: 4.3 mmol/L (ref 3.5–5.2)
Sodium: 143 mmol/L (ref 134–144)
Total Protein: 6.6 g/dL (ref 6.0–8.5)

## 2018-10-16 LAB — INSULIN, RANDOM: INSULIN: 13.8 u[IU]/mL (ref 2.6–24.9)

## 2018-10-16 LAB — HEMOGLOBIN A1C
Est. average glucose Bld gHb Est-mCnc: 103 mg/dL
Hgb A1c MFr Bld: 5.2 % (ref 4.8–5.6)

## 2018-10-16 LAB — VITAMIN D 25 HYDROXY (VIT D DEFICIENCY, FRACTURES): Vit D, 25-Hydroxy: 40.8 ng/mL (ref 30.0–100.0)

## 2018-10-16 NOTE — Progress Notes (Signed)
Office: [phone]  /  Fax: [phone]   HPI:   Chief Complaint: OBESITY Julie Moran is here to discuss her progress with her obesity treatment plan. She is on the keep a food journal with 1000-1100 calories and 80 grams of protein daily and is following her eating plan approximately 50 % of the time. She states she is walking for 30 minutes 3-4 times per week. 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 She wasn't journaling while away. She has gotten back on track with journaling since returning from her trip.  Her weight is 167 lb (75.8 kg) today and has had a weight loss of 4 pounds over a period of 2 weeks since her last visit. She has lost 16 lbs since starting treatment with [place].  Vitamin D Deficiency Julie Moran has a diagnosis of vitamin D deficiency. She is currently taking prescription Vit D. She notes fatigue and denies nausea, vomiting or muscle weakness.  At risk for osteopenia and osteoporosis Julie Moran is at higher risk of osteopenia and osteoporosis due to vitamin D deficiency.   Insulin Resistance Julie Moran has a diagnosis of insulin resistance based on her elevated fasting insulin level >5. Insulin was 16 at last lab and Hgb A1c was 5.2 on last draw. Although Julie Moran's blood glucose readings are still under good control, insulin resistance puts her at greater risk of metabolic syndrome and diabetes. She is not taking metformin currently and continues to work on diet and exercise to decrease risk of diabetes.  Fatty Liver Julie Moran has a historical diagnosis and last lab shows elevated ALT. She denies abdominal pain, jaundice, or excessive alcohol intake.  ALLERGIES: Allergies  Allergen Reactions  . No Known Allergies     MEDICATIONS: Current Outpatient Medications on File Prior to Visit  Medication Sig Dispense Refill  . amLODipine (NORVASC) 5 MG tablet Take 1 tablet by mouth Daily. Take 1/2 daily    . Nutritional Supplements (JUICE PLUS FIBRE PO)  Take 2 tablets by mouth 3 (three) times daily.    . pravastatin (PRAVACHOL) 20 MG tablet      No current facility-administered medications on file prior to visit.     PAST MEDICAL HISTORY: Past Medical History:  Diagnosis Date  . Allergy   . Atrophic vaginitis   . Birt-Hogg-Dube syndrome    lung cysts   . Birt-Hogg-Dube syndrome   . Chest pain   . Colon polyps    adenomatous  . Constipation   . Depression   . Diverticulosis   . Dry skin   . Fatty liver   . Floaters   . Gall bladder disease   . Glaucoma   . Heart murmur   . Herpes simplex   . Hyperlipidemia   . Hypertension   . Joint pain   . Lichen sclerosus   . Pancreatitis   . Pancreatitis   . Pneumothorax, right    spontaneous  . Shortness of breath   . Shortness of breath on exertion   . Swelling of both lower extremities   . Trouble in sleeping     PAST SURGICAL HISTORY: Past Surgical History:  Procedure Laterality Date  . BREAST BIOPSY  03/29/2006  . BREAST BIOPSY Bilateral 06/14/1998  . BREAST EXCISIONAL BIOPSY Bilateral 1999  . BREAST LUMPECTOMY  [phone]   x5 times total , both breasts  . CHOLECYSTECTOMY    . COLONOSCOPY    . hysterctomy     abdominal  . POLYPECTOMY    .  right ovary removed    . UPPER GASTROINTESTINAL ENDOSCOPY      SOCIAL HISTORY: Social History   Tobacco Use  . Smoking status: Never Smoker  . Smokeless tobacco: Never Used  Substance Use Topics  . Alcohol use: No    Alcohol/week: 0.0 standard drinks  . Drug use: No    FAMILY HISTORY: Family History  Problem Relation Age of Onset  . Diabetes Mother   . Breast cancer Mother   . CVA Mother   . Hypertension Mother   . Heart disease Mother   . Thyroid disease Mother   . Cancer Mother   . Diabetes Father   . Prostate cancer Father   . Heart disease Father   . Hypertension Father   . Hyperlipidemia Father   . Thyroid disease Father   . Cancer Father   . Obesity Father   . Liver disease Paternal  Grandmother   . Breast cancer Maternal Aunt        x 3  . CVA Paternal Grandfather   . Colon cancer Neg Hx   . Esophageal cancer Neg Hx   . Stomach cancer Neg Hx   . Rectal cancer Neg Hx     ROS: Review of Systems  Constitutional: Positive for malaise/fatigue and weight loss.  Eyes:       Negative jaundice  Gastrointestinal: Negative for abdominal pain, nausea and vomiting.  Musculoskeletal:       Negative muscle weakness    PHYSICAL EXAM: Blood pressure 115/65, pulse (!) 57, temperature 97.6 F (36.4 C), temperature source Oral, height 5\' 4"  (1.626 m), weight 167 lb (75.8 kg), SpO2 95 %. Body mass index is 28.67 kg/m. Physical Exam  Constitutional: She is oriented to person, place, and time. She appears well-developed and well-nourished.  Cardiovascular: Normal rate.  Pulmonary/Chest: Effort normal.  Musculoskeletal: Normal range of motion.  Neurological: She is oriented to person, place, and time.  Skin: Skin is warm and dry.  Psychiatric: She has a normal mood and affect. Her behavior is normal.  Vitals reviewed.   RECENT LABS AND TESTS: BMET    Component Value Date/Time   NA 143 10/15/2018 0859   K 4.3 10/15/2018 0859   CL 104 10/15/2018 0859   CO2 24 10/15/2018 0859   GLUCOSE 110 (H) 10/15/2018 0859   GLUCOSE 106 (H) 07/02/2012 0510   BUN 21 10/15/2018 0859   CREATININE 1.04 (H) 10/15/2018 0859   CALCIUM 9.5 10/15/2018 0859   GFRNONAA 57 (L) 10/15/2018 0859   GFRAA 66 10/15/2018 0859   Lab Results  Component Value Date   HGBA1C 5.2 10/15/2018   Lab Results  Component Value Date   INSULIN 13.8 10/15/2018   INSULIN 16.4 07/03/2018   CBC    Component Value Date/Time   WBC 7.8 07/02/2012 0510   RBC 4.79 07/02/2012 0510   HGB 14.4 07/02/2012 0510   HCT 40.9 07/02/2012 0510   PLT 165 07/02/2012 0510   MCV 85.4 07/02/2012 0510   MCH 30.1 07/02/2012 0510   MCHC 35.2 07/02/2012 0510   RDW 12.9 07/02/2012 0510   LYMPHSABS 2.7 12/27/2008 1722    MONOABS 0.4 12/27/2008 1722   EOSABS 0.1 12/27/2008 1722   BASOSABS 0.0 12/27/2008 1722   Iron/TIBC/Ferritin/ %Sat No results found for: IRON, TIBC, FERRITIN, IRONPCTSAT Lipid Panel  No results found for: CHOL, TRIG, HDL, CHOLHDL, VLDL, LDLCALC, LDLDIRECT Hepatic Function Panel     Component Value Date/Time   PROT 6.6 10/15/2018 0859  ALBUMIN 4.5 10/15/2018 0859   AST 20 10/15/2018 0859   ALT 29 10/15/2018 0859   ALKPHOS 99 10/15/2018 0859   BILITOT 0.5 10/15/2018 0859   BILIDIR <0.1 07/02/2012 0521   IBILI NOT CALCULATED 07/02/2012 0521      Component Value Date/Time   TSH 1.470 07/03/2018 1105  Results for VANIYAH, LANSKY (MRN [id number]) as of 10/16/2018 12:23  Ref. Range 07/03/2018 11:05  Vitamin D, 25-Hydroxy Latest Ref Range: 30.0 - 100.0 ng/mL 22.3 (L)    ASSESSMENT AND PLAN: Vitamin D deficiency - Plan: VITAMIN D 25 Hydroxy (Vit-D Deficiency, Fractures), Vitamin D, Ergocalciferol, (DRISDOL) 1.25 MG (50000 UT) CAPS capsule  Insulin resistance - Plan: Comprehensive metabolic panel, Hemoglobin A1c, Insulin, random  Fatty liver - Plan: Comprehensive metabolic panel  At risk for osteoporosis  Class 1 obesity with serious comorbidity and body mass index (BMI) of 30.0 to 30.9 in adult, unspecified obesity type - Starting BMI greater then 30  PLAN:  Vitamin D Deficiency Shekela was informed that low vitamin D levels contributes to fatigue and are associated with obesity, breast, and colon cancer. Valori agrees to continue to take prescription Vit D @50 ,000 IU every week and will follow up for routine testing of vitamin D, at least 2-3 times per year. She was informed of the risk of over-replacement of vitamin D and agrees to not increase her dose unless she discusses this with [place] first. We will check labs today and Amarachi agrees to follow up with our clinic in 2 weeks.  At risk for osteopenia and osteoporosis Janeva was given extended (15 minutes) osteoporosis prevention  counseling today. Nikisha is at risk for osteopenia and osteoporsis due to her vitamin D deficiency. She was encouraged to take her vitamin D and follow her higher calcium diet and increase strengthening exercise to help strengthen her bones and decrease her risk of osteopenia and osteoporosis.  Insulin Resistance Terrionna will continue to work on weight loss, exercise, and decreasing simple carbohydrates in her diet to help decrease the risk of diabetes. We dicussed metformin including benefits and risks. She was informed that eating too many simple carbohydrates or too many calories at one sitting increases the likelihood of GI side effects. Liah declined metformin for now and prescription was not written today. We will check labs today and Kaylei agrees to follow up with our clinic in 2 weeks as directed to monitor her progress.  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 Pantera was educated on her risk of developing NASH or even liver failure and th only proven treatment for NAFLD was weight loss. Vanesha agreed to continue with her weight loss efforts with healthier diet and exercise as an essential part of her treatment plan. We will check labs today and Shernell agrees to follow up with our clinic in 2 weeks.  Obesity Vangie is currently in the action stage of change. As such, her goal is to continue with weight loss efforts She has agreed to keep a food journal with 1000-1100 calories and 80 grams of protein daily Abbiegail has been instructed to work up to a goal of 150 minutes of combined cardio and strengthening exercise per week for weight loss and overall health benefits. We discussed the following Behavioral Modification Strategies today: increasing lean protein intake, decreasing simple carbohydrates, work on meal planning and easy cooking plans, and planning for success   Lynesha has agreed to follow  up  with our clinic in 2 weeks. She was informed of the importance of frequent follow up visits to maximize her success with intensive lifestyle modifications for her multiple health conditions.   OBESITY BEHAVIORAL INTERVENTION VISIT  Today's visit was # 8   Starting weight: 183 lbs Starting date: 07/03/18 Today's weight : 167 lbs Today's date: 10/15/2018 Total lbs lost to date: 20    ASK: We discussed the diagnosis of obesity with Reuel Boom today and Judeen Hammans agreed to give [place] permission to discuss obesity behavioral modification therapy today.  ASSESS: Adiyah has the diagnosis of obesity and her BMI today is 28.65 Shantaya is in the action stage of change   ADVISE: Kashae was educated on the multiple health risks of obesity as well as the benefit of weight loss to improve her health. She was advised of the need for long term treatment and the importance of lifestyle modifications to improve her current health and to decrease her risk of future health problems.  AGREE: Multiple dietary modification options and treatment options were discussed and  Kearie agreed to follow the recommendations documented in the above note.  ARRANGE: Chevy was educated on the importance of frequent visits to treat obesity as outlined per CMS and USPSTF guidelines and agreed to schedule her next follow up appointment today.  I, Trixie Dredge, am acting as transcriptionist for Ilene Qua, MD  I have reviewed the above documentation for accuracy and completeness, and I agree with the above. - Ilene Qua, MD

## 2018-10-29 ENCOUNTER — Ambulatory Visit (INDEPENDENT_AMBULATORY_CARE_PROVIDER_SITE_OTHER): Payer: BC Managed Care – PPO | Admitting: Family Medicine

## 2018-10-29 VITALS — BP 125/72 | HR 61 | Temp 98.1°F | Ht 64.0 in | Wt 167.0 lb

## 2018-10-29 DIAGNOSIS — E559 Vitamin D deficiency, unspecified: Secondary | ICD-10-CM | POA: Diagnosis not present

## 2018-10-29 DIAGNOSIS — Z6830 Body mass index (BMI) 30.0-30.9, adult: Secondary | ICD-10-CM | POA: Diagnosis not present

## 2018-10-29 DIAGNOSIS — E669 Obesity, unspecified: Secondary | ICD-10-CM

## 2018-11-03 ENCOUNTER — Encounter (INDEPENDENT_AMBULATORY_CARE_PROVIDER_SITE_OTHER): Payer: Self-pay | Admitting: Family Medicine

## 2018-11-03 NOTE — Progress Notes (Addendum)
Office: [phone]  /  Fax: [phone]   HPI:   Chief Complaint: OBESITY Julie Moran is here to discuss her progress with her obesity treatment plan. She is on the keep a food journal with 1000-1100 calories and 80 grams of protein daily and is following her eating plan approximately 60% of the time. She states she is walking for 30-60 minutes 2 times per week. Julie Moran struggles when she is traveling. She is concerned about her elevated fasting glucose, liver enzymes, and fasting insulin levels.  Her weight is 167 lb (75.8 kg) today and has not lost weight since her last visit. She has lost 16 lbs since starting treatment with [place].  Vitamin D Deficiency Julie Moran has a diagnosis of vitamin D deficiency. She is stable on prescription Vit D, but level is not at goal. Last Vit D level was 40.8 on 10/15/18. She denies nausea, vomiting or muscle weakness.  ALLERGIES: Allergies  Allergen Reactions  . No Known Allergies     MEDICATIONS: Current Outpatient Medications on File Prior to Visit  Medication Sig Dispense Refill  . amLODipine (NORVASC) 5 MG tablet Take 1 tablet by mouth Daily. Take 1/2 daily    . Nutritional Supplements (JUICE PLUS FIBRE PO) Take 2 tablets by mouth 3 (three) times daily.    . pravastatin (PRAVACHOL) 20 MG tablet     . Vitamin D, Ergocalciferol, (DRISDOL) 1.25 MG (50000 UT) CAPS capsule Take 1 capsule (50,000 Units total) by mouth every 7 (seven) days. 4 capsule 0   No current facility-administered medications on file prior to visit.     PAST MEDICAL HISTORY: Past Medical History:  Diagnosis Date  . Allergy   . Atrophic vaginitis   . Birt-Hogg-Dube syndrome    lung cysts   . Birt-Hogg-Dube syndrome   . Chest pain   . Colon polyps    adenomatous  . Constipation   . Depression   . Diverticulosis   . Dry skin   . Fatty liver   . Floaters   . Gall bladder disease   . Glaucoma   . Heart murmur   . Herpes simplex   . Hyperlipidemia   . Hypertension   .  Joint pain   . Lichen sclerosus   . Pancreatitis   . Pancreatitis   . Pneumothorax, right    spontaneous  . Shortness of breath   . Shortness of breath on exertion   . Swelling of both lower extremities   . Trouble in sleeping     PAST SURGICAL HISTORY: Past Surgical History:  Procedure Laterality Date  . BREAST BIOPSY  03/29/2006  . BREAST BIOPSY Bilateral 06/14/1998  . BREAST EXCISIONAL BIOPSY Bilateral 1999  . BREAST LUMPECTOMY  [phone]   x5 times total , both breasts  . CHOLECYSTECTOMY    . COLONOSCOPY    . hysterctomy     abdominal  . POLYPECTOMY    . right ovary removed    . UPPER GASTROINTESTINAL ENDOSCOPY      SOCIAL HISTORY: Social History   Tobacco Use  . Smoking status: Never Smoker  . Smokeless tobacco: Never Used  Substance Use Topics  . Alcohol use: No    Alcohol/week: 0.0 standard drinks  . Drug use: No    FAMILY HISTORY: Family History  Problem Relation Age of Onset  . Diabetes Mother   . Breast cancer Mother   . CVA Mother   . Hypertension Mother   . Heart disease Mother   . Thyroid disease  Mother   . Cancer Mother   . Diabetes Father   . Prostate cancer Father   . Heart disease Father   . Hypertension Father   . Hyperlipidemia Father   . Thyroid disease Father   . Cancer Father   . Obesity Father   . Liver disease Paternal Grandmother   . Breast cancer Maternal Aunt        x 3  . CVA Paternal Grandfather   . Colon cancer Neg Hx   . Esophageal cancer Neg Hx   . Stomach cancer Neg Hx   . Rectal cancer Neg Hx     ROS: Review of Systems  Constitutional: Negative for weight loss.  Gastrointestinal: Negative for nausea and vomiting.  Musculoskeletal:       Negative muscle weakness    PHYSICAL EXAM: Blood pressure 125/72, pulse 61, temperature 98.1 F (36.7 C), temperature source Oral, height 5\' 4"  (1.626 m), weight 167 lb (75.8 kg), SpO2 95 %. Body mass index is 28.67 kg/m. Physical Exam  Constitutional: She is  oriented to person, place, and time. She appears well-developed and well-nourished.  Cardiovascular: Normal rate.  Pulmonary/Chest: Effort normal.  Musculoskeletal: Normal range of motion.  Neurological: She is oriented to person, place, and time.  Skin: Skin is warm and dry.  Psychiatric: She has a normal mood and affect. Her behavior is normal.  Vitals reviewed.   RECENT LABS AND TESTS: BMET    Component Value Date/Time   NA 143 10/15/2018 0859   K 4.3 10/15/2018 0859   CL 104 10/15/2018 0859   CO2 24 10/15/2018 0859   GLUCOSE 110 (H) 10/15/2018 0859   GLUCOSE 106 (H) 07/02/2012 0510   BUN 21 10/15/2018 0859   CREATININE 1.04 (H) 10/15/2018 0859   CALCIUM 9.5 10/15/2018 0859   GFRNONAA 57 (L) 10/15/2018 0859   GFRAA 66 10/15/2018 0859   Lab Results  Component Value Date   HGBA1C 5.2 10/15/2018   Lab Results  Component Value Date   INSULIN 13.8 10/15/2018   INSULIN 16.4 07/03/2018   CBC    Component Value Date/Time   WBC 7.8 07/02/2012 0510   RBC 4.79 07/02/2012 0510   HGB 14.4 07/02/2012 0510   HCT 40.9 07/02/2012 0510   PLT 165 07/02/2012 0510   MCV 85.4 07/02/2012 0510   MCH 30.1 07/02/2012 0510   MCHC 35.2 07/02/2012 0510   RDW 12.9 07/02/2012 0510   LYMPHSABS 2.7 12/27/2008 1722   MONOABS 0.4 12/27/2008 1722   EOSABS 0.1 12/27/2008 1722   BASOSABS 0.0 12/27/2008 1722   Iron/TIBC/Ferritin/ %Sat No results found for: IRON, TIBC, FERRITIN, IRONPCTSAT Lipid Panel  No results found for: CHOL, TRIG, HDL, CHOLHDL, VLDL, LDLCALC, LDLDIRECT Hepatic Function Panel     Component Value Date/Time   PROT 6.6 10/15/2018 0859   ALBUMIN 4.5 10/15/2018 0859   AST 20 10/15/2018 0859   ALT 29 10/15/2018 0859   ALKPHOS 99 10/15/2018 0859   BILITOT 0.5 10/15/2018 0859   BILIDIR <0.1 07/02/2012 0521   IBILI NOT CALCULATED 07/02/2012 0521      Component Value Date/Time   TSH 1.470 07/03/2018 1105  Results for AAMANI, MOOSE (MRN [id number]) as of 11/03/2018  14:39  Ref. Range 10/15/2018 08:59  Vitamin D, 25-Hydroxy Latest Ref Range: 30.0 - 100.0 ng/mL 40.8    ASSESSMENT AND PLAN: Vitamin D deficiency  Class 1 obesity with serious comorbidity and body mass index (BMI) of 30.0 to 30.9 in adult, unspecified obesity type -  BMI   PLAN:  Vitamin D Deficiency Julie Moran was informed that low vitamin D levels contributes to fatigue and are associated with obesity, breast, and colon cancer. Julie Moran agrees to continue taking prescription Vit D @50 ,000 IU every week and will follow up for routine testing of vitamin D, at least 2-3 times per year. She was informed of the risk of over-replacement of vitamin D and agrees to not increase her dose unless she discusses this with [place] first. Julie Moran agrees to follow up with our clinic in 2 weeks.  I spent > than 50% of the 15 minute visit on counseling as documented in the note.  Obesity Julie Moran is currently in the action stage of change. As such, her goal is to continue with weight loss efforts She has agreed to keep a food journal with 1000-1100 calories and 80 grams of protein daily Julie Moran has been instructed to work up to a goal of 150 minutes of combined cardio and strengthening exercise per week for weight loss and overall health benefits. We discussed the following Behavioral Modification Strategies today: decreasing simple carbohydrates  We went over labs in detail.  Julie Moran has agreed to follow up with our clinic in 2 weeks. She was informed of the importance of frequent follow up visits to maximize her success with intensive lifestyle modifications for her multiple health conditions.   OBESITY BEHAVIORAL INTERVENTION VISIT  Today's visit was # 9  Starting weight: 183 lbs Starting date: 07/03/18 Today's weight : 167 lbs Today's date: 10/29/2018 Total lbs lost to date: 16    ASK: We discussed the diagnosis of obesity with Reuel Boom today and Judeen Hammans agreed to give [place] permission to discuss obesity  behavioral modification therapy today.  ASSESS: Julie Moran has the diagnosis of obesity and her BMI today is 28.65 Julie Moran is in the action stage of change   ADVISE: Julie Moran was educated on the multiple health risks of obesity as well as the benefit of weight loss to improve her health. She was advised of the need for long term treatment and the importance of lifestyle modifications to improve her current health and to decrease her risk of future health problems.  AGREE: Multiple dietary modification options and treatment options were discussed and  Julie Moran agreed to follow the recommendations documented in the above note.  ARRANGE: Davyn was educated on the importance of frequent visits to treat obesity as outlined per CMS and USPSTF guidelines and agreed to schedule her next follow up appointment today.  Julie Moran, am acting as Location manager for [employer], FNP-C.  I have reviewed the above documentation for accuracy and completeness, and I agree with the above.  - Dawn Whitmire, FNP-C.

## 2018-11-07 ENCOUNTER — Encounter (INDEPENDENT_AMBULATORY_CARE_PROVIDER_SITE_OTHER): Payer: Self-pay | Admitting: Family Medicine

## 2018-11-12 ENCOUNTER — Encounter (INDEPENDENT_AMBULATORY_CARE_PROVIDER_SITE_OTHER): Payer: Self-pay | Admitting: Family Medicine

## 2018-11-12 ENCOUNTER — Ambulatory Visit (INDEPENDENT_AMBULATORY_CARE_PROVIDER_SITE_OTHER): Payer: BC Managed Care – PPO | Admitting: Family Medicine

## 2018-11-12 VITALS — BP 144/77 | HR 56 | Temp 97.7°F | Ht 64.0 in | Wt 166.0 lb

## 2018-11-12 DIAGNOSIS — I10 Essential (primary) hypertension: Secondary | ICD-10-CM

## 2018-11-12 DIAGNOSIS — Z9189 Other specified personal risk factors, not elsewhere classified: Secondary | ICD-10-CM | POA: Diagnosis not present

## 2018-11-12 DIAGNOSIS — E663 Overweight: Secondary | ICD-10-CM

## 2018-11-12 DIAGNOSIS — E559 Vitamin D deficiency, unspecified: Secondary | ICD-10-CM

## 2018-11-12 MED ORDER — VITAMIN D (ERGOCALCIFEROL) 1.25 MG (50000 UNIT) PO CAPS: 50000.0000 [IU] | ORAL_CAPSULE | ORAL | 0 refills | Status: DC

## 2018-11-17 ENCOUNTER — Encounter (INDEPENDENT_AMBULATORY_CARE_PROVIDER_SITE_OTHER): Payer: Self-pay | Admitting: Family Medicine

## 2018-11-17 NOTE — Progress Notes (Signed)
HPI:   Chief Complaint: OVERWEIGHT Julie Moran is here to discuss her progress with her obesity treatment plan. Her BMI was 31.4 when starting our program. She is on the keep a food journal with 1000-1100 calories and 80g of protein  daily and is following her eating plan approximately 25 % of the time. She states she is exercising 0 minutes 0 times per week. Julie Moran has been ill with an upper respiratory infection and got off track.  She would like to supplement with protein powder and has questions about the best type of protein powder. Julie Moran, RD is attending the visit today to offer advice to the patient.    Her weight is 166 lb (75.3 kg) today and has had a weight loss of 1 pound over a period of 2 weeks since her last visit. She has lost 17 lbs since starting treatment with [place]. Her overall weight goal is around 145 lbs.   Vitamin D deficiency Julie Moran has a diagnosis of vitamin D deficiency. She is currently taking vit D, but not at goal and denies nausea, vomiting or muscle weakness.  Ref. Range 10/15/2018 08:59  Vitamin D, 25-Hydroxy Latest Ref Range: 30.0 - 100.0 ng/mL 40.8   Hypertension [name] is a 62 y.o. female with hypertension.  Julie Moran denies chest pain or shortness of breath on exertion. She is working weight loss to help control her blood pressure with the goal of decreasing her risk of heart attack and stroke. Sherrys blood pressure is elevated today. She is on Cordicin HBP for URI. She has not been taking antihypertensives.   At risk for osteopenia and osteoporosis Julie Moran is at higher risk of osteopenia and osteoporosis due to vitamin D deficiency.   ALLERGIES: Allergies  Allergen Reactions  . No Known Allergies     MEDICATIONS: Current Outpatient Medications on File Prior to Visit  Medication Sig Dispense Refill  . amLODipine (NORVASC) 5 MG tablet Take 1 tablet by mouth Daily. Take 1/2 daily    . Nutritional Supplements (JUICE PLUS FIBRE PO)  Take 2 tablets by mouth 3 (three) times daily.    . pravastatin (PRAVACHOL) 20 MG tablet      No current facility-administered medications on file prior to visit.     PAST MEDICAL HISTORY: Past Medical History:  Diagnosis Date  . Allergy   . Atrophic vaginitis   . Birt-Hogg-Dube syndrome    lung cysts   . Birt-Hogg-Dube syndrome   . Chest pain   . Colon polyps    adenomatous  . Constipation   . Depression   . Diverticulosis   . Dry skin   . Fatty liver   . Floaters   . Gall bladder disease   . Glaucoma   . Heart murmur   . Herpes simplex   . Hyperlipidemia   . Hypertension   . Joint pain   . Lichen sclerosus   . Pancreatitis   . Pancreatitis   . Pneumothorax, right    spontaneous  . Shortness of breath   . Shortness of breath on exertion   . Swelling of both lower extremities   . Trouble in sleeping     PAST SURGICAL HISTORY: Past Surgical History:  Procedure Laterality Date  . BREAST BIOPSY  03/29/2006  . BREAST BIOPSY Bilateral 06/14/1998  . BREAST EXCISIONAL BIOPSY Bilateral 1999  . BREAST LUMPECTOMY  [phone]   x5 times total , both breasts  . CHOLECYSTECTOMY    . COLONOSCOPY    .  hysterctomy     abdominal  . POLYPECTOMY    . right ovary removed    . UPPER GASTROINTESTINAL ENDOSCOPY      SOCIAL HISTORY: Social History   Tobacco Use  . Smoking status: Never Smoker  . Smokeless tobacco: Never Used  Substance Use Topics  . Alcohol use: No    Alcohol/week: 0.0 standard drinks  . Drug use: No    FAMILY HISTORY: Family History  Problem Relation Age of Onset  . Diabetes Mother   . Breast cancer Mother   . CVA Mother   . Hypertension Mother   . Heart disease Mother   . Thyroid disease Mother   . Cancer Mother   . Diabetes Father   . Prostate cancer Father   . Heart disease Father   . Hypertension Father   . Hyperlipidemia Father   . Thyroid disease Father   . Cancer Father   . Obesity Father   . Liver disease Paternal  Grandmother   . Breast cancer Maternal Aunt        x 3  . CVA Paternal Grandfather   . Colon cancer Neg Hx   . Esophageal cancer Neg Hx   . Stomach cancer Neg Hx   . Rectal cancer Neg Hx     ROS: Review of Systems  Constitutional: Positive for weight loss.  Respiratory: Negative for shortness of breath.   Cardiovascular: Negative for chest pain.  Gastrointestinal: Negative for nausea and vomiting.  Musculoskeletal:       Negative for muscle weakness    PHYSICAL EXAM: Blood pressure (!) 144/77, pulse (!) 56, temperature 97.7 F (36.5 C), temperature source Oral, height 5\' 4"  (1.626 m), weight 166 lb (75.3 kg), SpO2 97 %. Body mass index is 28.49 kg/m. Physical Exam  Constitutional: She is oriented to person, place, and time. She appears well-developed and well-nourished.  HENT:  Head: Normocephalic.  Eyes: Pupils are equal, round, and reactive to light.  Neck: Normal range of motion.  Cardiovascular: Normal rate.  Pulmonary/Chest: Effort normal.  Musculoskeletal: Normal range of motion.  Neurological: She is alert and oriented to person, place, and time.  Skin: Skin is warm and dry.  Psychiatric: She has a normal mood and affect. Her behavior is normal.  Vitals reviewed.   RECENT LABS AND TESTS: BMET    Component Value Date/Time   NA 143 10/15/2018 0859   K 4.3 10/15/2018 0859   CL 104 10/15/2018 0859   CO2 24 10/15/2018 0859   GLUCOSE 110 (H) 10/15/2018 0859   GLUCOSE 106 (H) 07/02/2012 0510   BUN 21 10/15/2018 0859   CREATININE 1.04 (H) 10/15/2018 0859   CALCIUM 9.5 10/15/2018 0859   GFRNONAA 57 (L) 10/15/2018 0859   GFRAA 66 10/15/2018 0859   Lab Results  Component Value Date   HGBA1C 5.2 10/15/2018   Lab Results  Component Value Date   INSULIN 13.8 10/15/2018   INSULIN 16.4 07/03/2018   CBC    Component Value Date/Time   WBC 7.8 07/02/2012 0510   RBC 4.79 07/02/2012 0510   HGB 14.4 07/02/2012 0510   HCT 40.9 07/02/2012 0510   PLT 165  07/02/2012 0510   MCV 85.4 07/02/2012 0510   MCH 30.1 07/02/2012 0510   MCHC 35.2 07/02/2012 0510   RDW 12.9 07/02/2012 0510   LYMPHSABS 2.7 12/27/2008 1722   MONOABS 0.4 12/27/2008 1722   EOSABS 0.1 12/27/2008 1722   BASOSABS 0.0 12/27/2008 1722   Iron/TIBC/Ferritin/ %Sat No results  found for: IRON, TIBC, FERRITIN, IRONPCTSAT Lipid Panel  No results found for: CHOL, TRIG, HDL, CHOLHDL, VLDL, LDLCALC, LDLDIRECT Hepatic Function Panel     Component Value Date/Time   PROT 6.6 10/15/2018 0859   ALBUMIN 4.5 10/15/2018 0859   AST 20 10/15/2018 0859   ALT 29 10/15/2018 0859   ALKPHOS 99 10/15/2018 0859   BILITOT 0.5 10/15/2018 0859   BILIDIR <0.1 07/02/2012 0521   IBILI NOT CALCULATED 07/02/2012 0521      Component Value Date/Time   TSH 1.470 07/03/2018 1105    Ref. Range 10/15/2018 08:59  Vitamin D, 25-Hydroxy Latest Ref Range: 30.0 - 100.0 ng/mL 40.8    ASSESSMENT AND PLAN: Vitamin D deficiency - Plan: Vitamin D, Ergocalciferol, (DRISDOL) 1.25 MG (50000 UT) CAPS capsule  Essential hypertension  At risk for osteoporosis  Overweight (BMI 25.0-29.9) - Beginning BMI >30  PLAN: Vitamin D Deficiency Julie Moran was informed that low vitamin D levels contributes to fatigue and are associated with obesity, breast, and colon cancer. She agrees to continue to take prescription Vit D @50 ,000 IU every week #4 with no refills and will follow up for routine testing of vitamin D, at least 2-3 times per year. She was informed of the risk of over-replacement of vitamin D and agrees to not increase her dose unless she discusses this with [place] first. We will repeat labs in 2 months.   Hypertension We discussed sodium restriction, working on healthy weight loss, and a regular exercise program as the means to achieve improved blood pressure control. Julie Moran agreed with this plan and agreed to follow up as directed. We will continue to monitor her blood pressure as well as her progress with the above  lifestyle modifications. She will continue her amlodipine 5 mg daily as prescribed and will watch for signs of hypotension as she continues her lifestyle modifications.  At risk for osteopenia and osteoporosis Little was given extended  (15 minutes) osteoporosis prevention counseling today. Julie Moran is at risk for osteopenia and osteoporosis due to her vitamin D deficiency. She was encouraged to take her vitamin D and follow her higher calcium diet and increase strengthening exercise to help strengthen her bones and decrease her risk of osteopenia and osteoporosis.  OVERWEIGHT Julie Moran is currently in the action stage of change. As such, her goal is to continue with weight loss efforts She has agreed to keep a food journal with 1000-1100 calories and 80g of protein daily.  Julie Moran Schlein, RD attended visit and discussed protein powder options with her. She was advised we would rather she get protein from food.  Julie Moran has been instructed to meet with trainer to discuss exercise options. Encouraged both cardio and resistance training.  Advised that decreasing body fat percentage should be her goal vs a particular weight goal. Current % body fat is 38.3%. We discussed the following Behavioral Modification Strategies today: increasing lean protein intake and keeping a strict journal.    Julie Moran has agreed to follow up with our clinic in 2 weeks. She was informed of the importance of frequent follow up visits to maximize her success with intensive lifestyle modifications for her multiple health conditions.   OBESITY BEHAVIORAL INTERVENTION VISIT  Today's visit was # 10   Starting weight: 183 lb Starting date: 07/03/18 Today's weight : Weight: 166 lb (75.3 kg)  Today's date: 11/12/18 Total lbs lost to date: 17 lb    ASK: We discussed the diagnosis of obesity with Julie Moran today and Julie Moran agreed to  give [place] permission to discuss obesity behavioral modification therapy  today.  ASSESS: Julie Moran has the diagnosis of obesity and her BMI today is 28.48 Julie Moran is in the action stage of change   ADVISE: Julie Moran was educated on the multiple health risks of obesity as well as the benefit of weight loss to improve her health. She was advised of the need for long term treatment and the importance of lifestyle modifications to improve her current health and to decrease her risk of future health problems.  AGREE: Multiple dietary modification options and treatment options were discussed and  Julie Moran agreed to follow the recommendations documented in the above note.  ARRANGE: Ashrita was educated on the importance of frequent visits to treat obesity as outlined per CMS and USPSTF guidelines and agreed to schedule her next follow up appointment today.  I, Renee Ramus, am acting as Location manager for [employer], FNP-C.  I have reviewed the above documentation for accuracy and completeness, and I agree with the above.  - Coleta Grosshans, FNP-C.

## 2018-11-26 ENCOUNTER — Encounter (INDEPENDENT_AMBULATORY_CARE_PROVIDER_SITE_OTHER): Payer: Self-pay | Admitting: Family Medicine

## 2018-11-26 ENCOUNTER — Ambulatory Visit (INDEPENDENT_AMBULATORY_CARE_PROVIDER_SITE_OTHER): Payer: BC Managed Care – PPO | Admitting: Family Medicine

## 2018-11-26 VITALS — BP 132/77 | HR 65 | Temp 97.5°F | Ht 64.0 in | Wt 167.0 lb

## 2018-11-26 DIAGNOSIS — I10 Essential (primary) hypertension: Secondary | ICD-10-CM

## 2018-11-26 DIAGNOSIS — E669 Obesity, unspecified: Secondary | ICD-10-CM

## 2018-11-26 DIAGNOSIS — E559 Vitamin D deficiency, unspecified: Secondary | ICD-10-CM

## 2018-11-26 DIAGNOSIS — Z6830 Body mass index (BMI) 30.0-30.9, adult: Secondary | ICD-10-CM | POA: Diagnosis not present

## 2018-11-26 NOTE — Progress Notes (Signed)
Office: [phone]  /  Fax: [phone]   HPI:   Chief Complaint: OBESITY Julie Moran is here to discuss her progress with her obesity treatment plan. She is on the keep a food journal with 1000-1100 calories and 80 grams of protein daily and is following her eating plan approximately 50 % of the time. She states she is exercising 0 minutes 0 times per week. Julie Moran had a recent trip to the [employer] and overindulged and didn't track. She has a lot of Christmas festivities in the next few weeks. No plans for New Years Eve.  Her weight is 167 lb (75.8 kg) today and has gained 1 pound since her last visit. She has lost 16 lbs since starting treatment with [place].  Vitamin D Deficiency Julie Moran has a diagnosis of vitamin D deficiency. She is currently taking prescription Vit D. She notes improving fatigue and denies nausea, vomiting or muscle weakness.  Hypertension Julie Moran is a 62 y.o. female with hypertension. [name]'s blood pressure is controlled today. She denies chest pain, chest pressure, or headaches. She is working weight loss to help control her blood pressure with the goal of decreasing her risk of heart attack and stroke.   ASSESSMENT AND PLAN:  Vitamin D deficiency  Essential hypertension  Class 1 obesity with serious comorbidity and body mass index (BMI) of 30.0 to 30.9 in adult, unspecified obesity type - Starting BMI greater then 30  PLAN:  Vitamin D Deficiency Julie Moran was informed that low vitamin D levels contributes to fatigue and are associated with obesity, breast, and colon cancer. Julie Moran agrees to continue taking prescription Vit D @50 ,000 IU every week and will follow up for routine testing of vitamin D, at least 2-3 times per year. She was informed of the risk of over-replacement of vitamin D and agrees to not increase her dose unless she discusses this with [place] first. Julie Moran agrees to follow up with our clinic in 3 weeks.  Hypertension We discussed sodium  restriction, working on healthy weight loss, and a regular exercise program as the means to achieve improved blood pressure control. Julie Moran agreed with this plan and agreed to follow up as directed. We will continue to monitor her blood pressure as well as her progress with the above lifestyle modifications. Julie Moran agrees to continue taking amlodipine and she will watch for signs of hypotension as she continues her lifestyle modifications. Julie Moran agrees to follow up with our clinic in 3 weeks.  I spent > than 50% of the 15 minute visit on counseling as documented in the note.  Obesity Julie Moran is currently in the action stage of change. As such, her goal is to continue with weight loss efforts She has agreed to keep a food journal with 1100 calories and 80+ grams of protein daily Julie Moran has been instructed to work up to a goal of 150 minutes of combined cardio and strengthening exercise per week for weight loss and overall health benefits. We discussed the following Behavioral Modification Strategies today: increasing lean protein intake, increasing vegetables, work on meal planning and easy cooking plans, holiday eating strategies, planning for success, and keep a strict food journal    Julie Moran has agreed to follow up with our clinic in 3 weeks. She was informed of the importance of frequent follow up visits to maximize her success with intensive lifestyle modifications for her multiple health conditions.  ALLERGIES: Allergies  Allergen Reactions  . No Known Allergies     MEDICATIONS: Current Outpatient Medications  on File Prior to Visit  Medication Sig Dispense Refill  . amLODipine (NORVASC) 5 MG tablet Take 1 tablet by mouth Daily. Take 1/2 daily    . Nutritional Supplements (JUICE PLUS FIBRE PO) Take 2 tablets by mouth 3 (three) times daily.    . pravastatin (PRAVACHOL) 20 MG tablet     . Vitamin D, Ergocalciferol, (DRISDOL) 1.25 MG (50000 UT) CAPS capsule Take 1 capsule (50,000 Units  total) by mouth every 7 (seven) days. 4 capsule 0   No current facility-administered medications on file prior to visit.     PAST MEDICAL HISTORY: Past Medical History:  Diagnosis Date  . Allergy   . Atrophic vaginitis   . Birt-Hogg-Dube syndrome    lung cysts   . Birt-Hogg-Dube syndrome   . Chest pain   . Colon polyps    adenomatous  . Constipation   . Depression   . Diverticulosis   . Dry skin   . Fatty liver   . Floaters   . Gall bladder disease   . Glaucoma   . Heart murmur   . Herpes simplex   . Hyperlipidemia   . Hypertension   . Joint pain   . Lichen sclerosus   . Pancreatitis   . Pancreatitis   . Pneumothorax, right    spontaneous  . Shortness of breath   . Shortness of breath on exertion   . Swelling of both lower extremities   . Trouble in sleeping     PAST SURGICAL HISTORY: Past Surgical History:  Procedure Laterality Date  . BREAST BIOPSY  03/29/2006  . BREAST BIOPSY Bilateral 06/14/1998  . BREAST EXCISIONAL BIOPSY Bilateral 1999  . BREAST LUMPECTOMY  [phone]   x5 times total , both breasts  . CHOLECYSTECTOMY    . COLONOSCOPY    . hysterctomy     abdominal  . POLYPECTOMY    . right ovary removed    . UPPER GASTROINTESTINAL ENDOSCOPY      SOCIAL HISTORY: Social History   Tobacco Use  . Smoking status: Never Smoker  . Smokeless tobacco: Never Used  Substance Use Topics  . Alcohol use: No    Alcohol/week: 0.0 standard drinks  . Drug use: No    FAMILY HISTORY: Family History  Problem Relation Age of Onset  . Diabetes Mother   . Breast cancer Mother   . CVA Mother   . Hypertension Mother   . Heart disease Mother   . Thyroid disease Mother   . Cancer Mother   . Diabetes Father   . Prostate cancer Father   . Heart disease Father   . Hypertension Father   . Hyperlipidemia Father   . Thyroid disease Father   . Cancer Father   . Obesity Father   . Liver disease Paternal Grandmother   . Breast cancer Maternal Aunt         x 3  . CVA Paternal Grandfather   . Colon cancer Neg Hx   . Esophageal cancer Neg Hx   . Stomach cancer Neg Hx   . Rectal cancer Neg Hx     ROS: Review of Systems  Constitutional: Positive for malaise/fatigue. Negative for weight loss.  Cardiovascular: Negative for chest pain.       Negative chest pressure  Gastrointestinal: Negative for nausea and vomiting.  Musculoskeletal:       Negative muscle weakness  Neurological: Negative for headaches.    PHYSICAL EXAM: Blood pressure 132/77, pulse 65, temperature (!) 97.5 F (  36.4 C), temperature source Oral, height 5\' 4"  (1.626 m), weight 167 lb (75.8 kg), SpO2 96 %. Body mass index is 28.67 kg/m. Physical Exam Vitals signs reviewed.  Constitutional:      Appearance: Normal appearance. She is obese.  Cardiovascular:     Rate and Rhythm: Normal rate.  Pulmonary:     Effort: Pulmonary effort is normal.  Musculoskeletal: Normal range of motion.  Skin:    General: Skin is warm and dry.  Neurological:     Mental Status: She is alert and oriented to person, place, and time.  Psychiatric:        Mood and Affect: Mood normal.        Behavior: Behavior normal.     RECENT LABS AND TESTS: BMET    Component Value Date/Time   NA 143 10/15/2018 0859   K 4.3 10/15/2018 0859   CL 104 10/15/2018 0859   CO2 24 10/15/2018 0859   GLUCOSE 110 (H) 10/15/2018 0859   GLUCOSE 106 (H) 07/02/2012 0510   BUN 21 10/15/2018 0859   CREATININE 1.04 (H) 10/15/2018 0859   CALCIUM 9.5 10/15/2018 0859   GFRNONAA 57 (L) 10/15/2018 0859   GFRAA 66 10/15/2018 0859   Lab Results  Component Value Date   HGBA1C 5.2 10/15/2018   Lab Results  Component Value Date   INSULIN 13.8 10/15/2018   INSULIN 16.4 07/03/2018   CBC    Component Value Date/Time   WBC 7.8 07/02/2012 0510   RBC 4.79 07/02/2012 0510   HGB 14.4 07/02/2012 0510   HCT 40.9 07/02/2012 0510   PLT 165 07/02/2012 0510   MCV 85.4 07/02/2012 0510   MCH 30.1 07/02/2012 0510   MCHC  35.2 07/02/2012 0510   RDW 12.9 07/02/2012 0510   LYMPHSABS 2.7 12/27/2008 1722   MONOABS 0.4 12/27/2008 1722   EOSABS 0.1 12/27/2008 1722   BASOSABS 0.0 12/27/2008 1722   Iron/TIBC/Ferritin/ %Sat No results found for: IRON, TIBC, FERRITIN, IRONPCTSAT Lipid Panel  No results found for: CHOL, TRIG, HDL, CHOLHDL, VLDL, LDLCALC, LDLDIRECT Hepatic Function Panel     Component Value Date/Time   PROT 6.6 10/15/2018 0859   ALBUMIN 4.5 10/15/2018 0859   AST 20 10/15/2018 0859   ALT 29 10/15/2018 0859   ALKPHOS 99 10/15/2018 0859   BILITOT 0.5 10/15/2018 0859   BILIDIR <0.1 07/02/2012 0521   IBILI NOT CALCULATED 07/02/2012 0521      Component Value Date/Time   TSH 1.470 07/03/2018 1105      OBESITY BEHAVIORAL INTERVENTION VISIT  Today's visit was # 11   Starting weight: 183 lbs Starting date: 07/03/18 Today's weight : 167 lbs Today's date: 11/26/2018 Total lbs lost to date: 16    ASK: We discussed the diagnosis of obesity with Reuel Boom today and Judeen Hammans agreed to give [place] permission to discuss obesity behavioral modification therapy today.  ASSESS: Chavon has the diagnosis of obesity and her BMI today is 28.65 Elenor is in the action stage of change   ADVISE: Maleni was educated on the multiple health risks of obesity as well as the benefit of weight loss to improve her health. She was advised of the need for long term treatment and the importance of lifestyle modifications to improve her current health and to decrease her risk of future health problems.  AGREE: Multiple dietary modification options and treatment options were discussed and  Alanii agreed to follow the recommendations documented in the above note.  ARRANGE: Mame was educated on  the importance of frequent visits to treat obesity as outlined per CMS and USPSTF guidelines and agreed to schedule her next follow up appointment today.  I, Trixie Dredge, am acting as transcriptionist for Ilene Qua, MD  I have reviewed the above documentation for accuracy and completeness, and I agree with the above. - Ilene Qua, MD

## 2018-12-17 ENCOUNTER — Ambulatory Visit (INDEPENDENT_AMBULATORY_CARE_PROVIDER_SITE_OTHER): Payer: BC Managed Care – PPO | Admitting: Family Medicine

## 2018-12-17 ENCOUNTER — Encounter (INDEPENDENT_AMBULATORY_CARE_PROVIDER_SITE_OTHER): Payer: Self-pay | Admitting: Family Medicine

## 2018-12-17 VITALS — BP 114/61 | HR 64 | Temp 98.5°F | Ht 64.0 in | Wt 165.0 lb

## 2018-12-17 DIAGNOSIS — I10 Essential (primary) hypertension: Secondary | ICD-10-CM | POA: Diagnosis not present

## 2018-12-17 DIAGNOSIS — Z6830 Body mass index (BMI) 30.0-30.9, adult: Secondary | ICD-10-CM

## 2018-12-17 DIAGNOSIS — E559 Vitamin D deficiency, unspecified: Secondary | ICD-10-CM

## 2018-12-17 DIAGNOSIS — Z9189 Other specified personal risk factors, not elsewhere classified: Secondary | ICD-10-CM

## 2018-12-17 DIAGNOSIS — E669 Obesity, unspecified: Secondary | ICD-10-CM | POA: Diagnosis not present

## 2018-12-17 MED ORDER — VITAMIN D (ERGOCALCIFEROL) 1.25 MG (50000 UNIT) PO CAPS: 50000.0000 [IU] | ORAL_CAPSULE | ORAL | 0 refills | Status: DC

## 2018-12-18 NOTE — Progress Notes (Signed)
Office: [phone]  /  Fax: [phone]   HPI:   Chief Complaint: OBESITY Julie Moran is here to discuss her progress with her obesity treatment plan. She is on the keep a food journal with 1100 calories and 80+ grams of protein daily and is following her eating plan approximately 50 % of the time. She states she is doing strengthening and cardio for 30 minutes 3 times per week. [name] had a pizza party on Christmas Eve, and went out on New Year's Eve with her friends to see [place].  Her weight is 165 lb (74.8 kg) today and has had a weight loss of 2 pounds over a period of 3 weeks since her last visit. She has lost 18 lbs since starting treatment with [place].  Vitamin D Deficiency Julie Moran has a diagnosis of vitamin D deficiency. She is currently taking vit D. She notes fatigue and denies nausea, vomiting or muscle weakness.  At risk for osteopenia and osteoporosis Julie Moran is at higher risk of osteopenia and osteoporosis due to vitamin D deficiency.   Hypertension [name] is a 62 y.o. female with hypertension. Mickala's blood pressure is controlled. She denies chest pain, chest pressure, or headaches. She is working weight loss to help control her blood pressure with the goal of decreasing her risk of heart attack and stroke.   ASSESSMENT AND PLAN:  Vitamin D deficiency - Plan: Vitamin D, Ergocalciferol, (DRISDOL) 1.25 MG (50000 UT) CAPS capsule  Essential hypertension  At risk for osteoporosis  Class 1 obesity with serious comorbidity and body mass index (BMI) of 30.0 to 30.9 in adult, unspecified obesity type - BMI greater than 30 at start of program  PLAN:  Vitamin D Deficiency Julie Moran was informed that low vitamin D levels contributes to fatigue and are associated with obesity, breast, and colon cancer. Julie Moran agrees to continue taking prescription Vit D @50 ,000 IU every week #4 and we will refill for 1 month. She will follow up for routine testing of vitamin D, at least  2-3 times per year. She was informed of the risk of over-replacement of vitamin D and agrees to not increase her dose unless she discusses this with [place] first. Julie Moran agrees to follow up with our clinic in 4 weeks.  At risk for osteopenia and osteoporosis Julie Moran was given extended (15 minutes) osteoporosis prevention counseling today. Julie Moran is at risk for osteopenia and osteoporsis due to her vitamin D deficiency. She was encouraged to take her vitamin D and follow her higher calcium diet and increase strengthening exercise to help strengthen her bones and decrease her risk of osteopenia and osteoporosis.  Hypertension We discussed sodium restriction, working on healthy weight loss, and a regular exercise program as the means to achieve improved blood pressure control. Julie Moran agreed with this plan and agreed to follow up as directed. We will continue to monitor her blood pressure as well as her progress with the above lifestyle modifications. Julie Moran agrees to continue taking amlodipine and pravachol and will watch for signs of hypotension as she continues her lifestyle modifications. Julie Moran agrees to follow up with our clinic in 4 weeks.  Obesity Julie Moran is currently in the action stage of change. As such, her goal is to continue with weight loss efforts She has agreed to keep a food journal with 1150-1250 calories and 85 grams of protein daily Julie Moran has been instructed to work up to a goal of 150 minutes of combined cardio and strengthening exercise per week for weight loss  and overall health benefits. We discussed the following Behavioral Modification Strategies today: increasing lean protein intake, increasing vegetables, work on meal planning and easy cooking plans, planning for success, and keep a strict food journal Julie Moran plans to go to the [place] [employer] and signed up for 8 week program to reacclimatize to equipment.  Julie Moran has agreed to follow up with our clinic in 4  weeks. She was informed of the importance of frequent follow up visits to maximize her success with intensive lifestyle modifications for her multiple health conditions.  ALLERGIES: Allergies  Allergen Reactions  . No Known Allergies     MEDICATIONS: Current Outpatient Medications on File Prior to Visit  Medication Sig Dispense Refill  . amLODipine (NORVASC) 5 MG tablet Take 1 tablet by mouth Daily. Take 1/2 daily    . Nutritional Supplements (JUICE PLUS FIBRE PO) Take 2 tablets by mouth 3 (three) times daily.    . pravastatin (PRAVACHOL) 20 MG tablet      No current facility-administered medications on file prior to visit.     PAST MEDICAL HISTORY: Past Medical History:  Diagnosis Date  . Allergy   . Atrophic vaginitis   . Birt-Hogg-Dube syndrome    lung cysts   . Birt-Hogg-Dube syndrome   . Chest pain   . Colon polyps    adenomatous  . Constipation   . Depression   . Diverticulosis   . Dry skin   . Fatty liver   . Floaters   . Gall bladder disease   . Glaucoma   . Heart murmur   . Herpes simplex   . Hyperlipidemia   . Hypertension   . Joint pain   . Lichen sclerosus   . Pancreatitis   . Pancreatitis   . Pneumothorax, right    spontaneous  . Shortness of breath   . Shortness of breath on exertion   . Swelling of both lower extremities   . Trouble in sleeping     PAST SURGICAL HISTORY: Past Surgical History:  Procedure Laterality Date  . BREAST BIOPSY  03/29/2006  . BREAST BIOPSY Bilateral 06/14/1998  . BREAST EXCISIONAL BIOPSY Bilateral 1999  . BREAST LUMPECTOMY  [phone]   x5 times total , both breasts  . CHOLECYSTECTOMY    . COLONOSCOPY    . hysterctomy     abdominal  . POLYPECTOMY    . right ovary removed    . UPPER GASTROINTESTINAL ENDOSCOPY      SOCIAL HISTORY: Social History   Tobacco Use  . Smoking status: Never Smoker  . Smokeless tobacco: Never Used  Substance Use Topics  . Alcohol use: No    Alcohol/week: 0.0 standard  drinks  . Drug use: No    FAMILY HISTORY: Family History  Problem Relation Age of Onset  . Diabetes Mother   . Breast cancer Mother   . CVA Mother   . Hypertension Mother   . Heart disease Mother   . Thyroid disease Mother   . Cancer Mother   . Diabetes Father   . Prostate cancer Father   . Heart disease Father   . Hypertension Father   . Hyperlipidemia Father   . Thyroid disease Father   . Cancer Father   . Obesity Father   . Liver disease Paternal Grandmother   . Breast cancer Maternal Aunt        x 3  . CVA Paternal Grandfather   . Colon cancer Neg Hx   . Esophageal  cancer Neg Hx   . Stomach cancer Neg Hx   . Rectal cancer Neg Hx     ROS: Review of Systems  Constitutional: Positive for malaise/fatigue and weight loss.  Cardiovascular: Negative for chest pain.       Negative chest pressure  Gastrointestinal: Negative for nausea and vomiting.  Musculoskeletal:       Negative muscle weakness  Neurological: Negative for headaches.    PHYSICAL EXAM: Blood pressure 114/61, pulse 64, temperature 98.5 F (36.9 C), temperature source Oral, height 5\' 4"  (1.626 m), weight 165 lb (74.8 kg), SpO2 98 %. Body mass index is 28.32 kg/m. Physical Exam Vitals signs reviewed.  Constitutional:      Appearance: Normal appearance. She is obese.  Cardiovascular:     Rate and Rhythm: Normal rate.     Pulses: Normal pulses.  Pulmonary:     Effort: Pulmonary effort is normal.     Breath sounds: Normal breath sounds.  Musculoskeletal: Normal range of motion.  Skin:    General: Skin is warm and dry.  Neurological:     Mental Status: She is alert and oriented to person, place, and time.  Psychiatric:        Mood and Affect: Mood normal.        Behavior: Behavior normal.     RECENT LABS AND TESTS: BMET    Component Value Date/Time   NA 143 10/15/2018 0859   K 4.3 10/15/2018 0859   CL 104 10/15/2018 0859   CO2 24 10/15/2018 0859   GLUCOSE 110 (H) 10/15/2018 0859    GLUCOSE 106 (H) 07/02/2012 0510   BUN 21 10/15/2018 0859   CREATININE 1.04 (H) 10/15/2018 0859   CALCIUM 9.5 10/15/2018 0859   GFRNONAA 57 (L) 10/15/2018 0859   GFRAA 66 10/15/2018 0859   Lab Results  Component Value Date   HGBA1C 5.2 10/15/2018   Lab Results  Component Value Date   INSULIN 13.8 10/15/2018   INSULIN 16.4 07/03/2018   CBC    Component Value Date/Time   WBC 7.8 07/02/2012 0510   RBC 4.79 07/02/2012 0510   HGB 14.4 07/02/2012 0510   HCT 40.9 07/02/2012 0510   PLT 165 07/02/2012 0510   MCV 85.4 07/02/2012 0510   MCH 30.1 07/02/2012 0510   MCHC 35.2 07/02/2012 0510   RDW 12.9 07/02/2012 0510   LYMPHSABS 2.7 12/27/2008 1722   MONOABS 0.4 12/27/2008 1722   EOSABS 0.1 12/27/2008 1722   BASOSABS 0.0 12/27/2008 1722   Iron/TIBC/Ferritin/ %Sat No results found for: IRON, TIBC, FERRITIN, IRONPCTSAT Lipid Panel  No results found for: CHOL, TRIG, HDL, CHOLHDL, VLDL, LDLCALC, LDLDIRECT Hepatic Function Panel     Component Value Date/Time   PROT 6.6 10/15/2018 0859   ALBUMIN 4.5 10/15/2018 0859   AST 20 10/15/2018 0859   ALT 29 10/15/2018 0859   ALKPHOS 99 10/15/2018 0859   BILITOT 0.5 10/15/2018 0859   BILIDIR <0.1 07/02/2012 0521   IBILI NOT CALCULATED 07/02/2012 0521      Component Value Date/Time   TSH 1.470 07/03/2018 1105      OBESITY BEHAVIORAL INTERVENTION VISIT  Today's visit was # 12   Starting weight: 183 lbs Starting date: 07/03/18 Today's weight : 165 lbs  Today's date: 12/17/2018 Total lbs lost to date: 18    ASK: We discussed the diagnosis of obesity with Reuel Boom today and Judeen Hammans agreed to give [place] permission to discuss obesity behavioral modification therapy today.  ASSESS: Florean has the diagnosis  of obesity and her BMI today is 28.31 Chizaram is in the action stage of change   ADVISE: Hser was educated on the multiple health risks of obesity as well as the benefit of weight loss to improve her health. She was advised  of the need for long term treatment and the importance of lifestyle modifications to improve her current health and to decrease her risk of future health problems.  AGREE: Multiple dietary modification options and treatment options were discussed and  Mitsuko agreed to follow the recommendations documented in the above note.  ARRANGE: Yailin was educated on the importance of frequent visits to treat obesity as outlined per CMS and USPSTF guidelines and agreed to schedule her next follow up appointment today.  I, Trixie Dredge, am acting as transcriptionist for Ilene Qua, MD  I have reviewed the above documentation for accuracy and completeness, and I agree with the above. - Ilene Qua, MD

## 2019-01-07 ENCOUNTER — Ambulatory Visit (INDEPENDENT_AMBULATORY_CARE_PROVIDER_SITE_OTHER): Payer: BC Managed Care – PPO | Admitting: Family Medicine

## 2019-01-07 ENCOUNTER — Encounter (INDEPENDENT_AMBULATORY_CARE_PROVIDER_SITE_OTHER): Payer: Self-pay | Admitting: Family Medicine

## 2019-01-07 VITALS — BP 126/73 | HR 61 | Temp 97.6°F | Ht 64.0 in | Wt 164.0 lb

## 2019-01-07 DIAGNOSIS — E7849 Other hyperlipidemia: Secondary | ICD-10-CM | POA: Diagnosis not present

## 2019-01-07 DIAGNOSIS — E669 Obesity, unspecified: Secondary | ICD-10-CM

## 2019-01-07 DIAGNOSIS — Z6830 Body mass index (BMI) 30.0-30.9, adult: Secondary | ICD-10-CM | POA: Diagnosis not present

## 2019-01-07 DIAGNOSIS — I10 Essential (primary) hypertension: Secondary | ICD-10-CM | POA: Diagnosis not present

## 2019-01-07 NOTE — Progress Notes (Signed)
Office: [phone]  /  Fax: [phone]   HPI:   Chief Complaint: OBESITY Julie Moran is here to discuss her progress with her obesity treatment plan. She is on the keep a food journal with 1150-1250 calories and 85 grams of protein daily and is following her eating plan approximately 60 % of the time. She states she is swimming 2 times per week. [facility] and [place] enjoying time in the water. She reports it's been hard to get back on track since the holidays.  Her weight is 164 lb (74.4 kg) today and has had a weight loss of 1 pound over a period of 3 weeks since her last visit. She has lost 19 lbs since starting treatment with [place].  Hypertension Julie Moran is a 63 y.o. female with hypertension. Julie Moran's blood pressure is controlled today. She denies chest pain, chest pressure, or headaches. She is working on weight loss to help control her blood pressure with the goal of decreasing her risk of heart attack and stroke.   Hyperlipidemia Julie Moran has hyperlipidemia and has been trying to improve her cholesterol levels with intensive lifestyle modification including a low saturated fat diet, exercise and weight loss. She is on Pravachol and denies any chest pain, claudication or myalgias.  ASSESSMENT AND PLAN:  Essential hypertension  Other hyperlipidemia  Class 1 obesity with serious comorbidity and body mass index (BMI) of 30.0 to 30.9 in adult, unspecified obesity type - Starting BMI greater then 30  PLAN:  Hypertension We discussed sodium restriction, working on healthy weight loss, and a regular exercise program as the means to achieve improved blood pressure control. [name] agreed with this plan and agreed to follow up as directed. We will continue to monitor her blood pressure as well as her progress with the above lifestyle modifications. [name] agrees to continue taking amlodipine and will watch for signs of hypotension as she  continues her lifestyle modifications. [name] agrees to follow up with our clinic in 2 weeks.  Hyperlipidemia Julie Moran was informed of the American Heart Association Guidelines emphasizing intensive lifestyle modifications as the first line treatment for hyperlipidemia. We discussed many lifestyle modifications today in depth, and Julie Moran will continue to work on decreasing saturated fats such as fatty red meat, butter and many fried foods. Julie Moran agrees to continue taking statin, and she will also increase vegetables and lean protein in her diet and continue to work on exercise and weight loss efforts. Julie Moran agrees to follow up with our clinic in 2 weeks.  I spent > than 50% of the 15 minute visit on counseling as documented in the note.  Obesity Julie Moran is currently in the action stage of change. As such, her goal is to continue with weight loss efforts She has agreed to keep a food journal with 1150-1250 calories and 85+ grams of protein daily [name] has been instructed to work up to a goal of 150 minutes of combined cardio and strengthening exercise per week for weight loss and overall health benefits. We discussed the following Behavioral Modification Strategies today: increasing lean protein intake, increasing vegetables, work on meal planning and easy cooking plans, planning for success, and keep a strict food journal   Julie Moran has agreed to follow up with our clinic in 2 weeks. She was informed of the importance of frequent follow up visits to maximize her success with intensive lifestyle modifications for her multiple health conditions.  ALLERGIES: Allergies  Allergen Reactions  .  No Known Allergies     MEDICATIONS: Current Outpatient Medications on File Prior to Visit  Medication Sig Dispense Refill  . amLODipine (NORVASC) 5 MG tablet Take 1 tablet by mouth Daily. Take 1/2 daily    . Nutritional Supplements (JUICE PLUS FIBRE PO) Take 2 tablets by mouth 3 (three) times daily.    .  pravastatin (PRAVACHOL) 20 MG tablet     . Vitamin D, Ergocalciferol, (DRISDOL) 1.25 MG (50000 UT) CAPS capsule Take 1 capsule (50,000 Units total) by mouth every 7 (seven) days. 4 capsule 0   No current facility-administered medications on file prior to visit.     PAST MEDICAL HISTORY: Past Medical History:  Diagnosis Date  . Allergy   . Atrophic vaginitis   . Birt-Hogg-Dube syndrome    lung cysts   . Birt-Hogg-Dube syndrome   . Chest pain   . Colon polyps    adenomatous  . Constipation   . Depression   . Diverticulosis   . Dry skin   . Fatty liver   . Floaters   . Gall bladder disease   . Glaucoma   . Heart murmur   . Herpes simplex   . Hyperlipidemia   . Hypertension   . Joint pain   . Lichen sclerosus   . Pancreatitis   . Pancreatitis   . Pneumothorax, right    spontaneous  . Shortness of breath   . Shortness of breath on exertion   . Swelling of both lower extremities   . Trouble in sleeping     PAST SURGICAL HISTORY: Past Surgical History:  Procedure Laterality Date  . BREAST BIOPSY  03/29/2006  . BREAST BIOPSY Bilateral 06/14/1998  . BREAST EXCISIONAL BIOPSY Bilateral 1999  . BREAST LUMPECTOMY  [phone]   x5 times total , both breasts  . CHOLECYSTECTOMY    . COLONOSCOPY    . hysterctomy     abdominal  . POLYPECTOMY    . right ovary removed    . UPPER GASTROINTESTINAL ENDOSCOPY      SOCIAL HISTORY: Social History   Tobacco Use  . Smoking status: Never Smoker  . Smokeless tobacco: Never Used  Substance Use Topics  . Alcohol use: No    Alcohol/week: 0.0 standard drinks  . Drug use: No    FAMILY HISTORY: Family History  Problem Relation Age of Onset  . Diabetes Mother   . Breast cancer Mother   . CVA Mother   . Hypertension Mother   . Heart disease Mother   . Thyroid disease Mother   . Cancer Mother   . Diabetes Father   . Prostate cancer Father   . Heart disease Father   . Hypertension Father   . Hyperlipidemia Father   .  Thyroid disease Father   . Cancer Father   . Obesity Father   . Liver disease Paternal Grandmother   . Breast cancer Maternal Aunt        x 3  . CVA Paternal Grandfather   . Colon cancer Neg Hx   . Esophageal cancer Neg Hx   . Stomach cancer Neg Hx   . Rectal cancer Neg Hx     ROS: Review of Systems  Constitutional: Positive for weight loss.  Cardiovascular: Negative for chest pain and claudication.       Negative chest pressure  Musculoskeletal: Negative for myalgias.  Neurological: Negative for headaches.    PHYSICAL EXAM: Blood pressure 126/73, pulse 61, temperature 97.6 F (36.4 C), temperature source  Oral, height 5\' 4"  (1.626 m), weight 164 lb (74.4 kg), SpO2 95 %. Body mass index is 28.15 kg/m. Physical Exam Vitals signs reviewed.  Constitutional:      Appearance: Normal appearance. She is obese.  Cardiovascular:     Rate and Rhythm: Normal rate.     Pulses: Normal pulses.  Pulmonary:     Effort: Pulmonary effort is normal.     Breath sounds: Normal breath sounds.  Musculoskeletal: Normal range of motion.  Skin:    General: Skin is warm and dry.  Neurological:     Mental Status: She is alert and oriented to person, place, and time.  Psychiatric:        Mood and Affect: Mood normal.        Behavior: Behavior normal.     RECENT LABS AND TESTS: BMET    Component Value Date/Time   NA 143 10/15/2018 0859   K 4.3 10/15/2018 0859   CL 104 10/15/2018 0859   CO2 24 10/15/2018 0859   GLUCOSE 110 (H) 10/15/2018 0859   GLUCOSE 106 (H) 07/02/2012 0510   BUN 21 10/15/2018 0859   CREATININE 1.04 (H) 10/15/2018 0859   CALCIUM 9.5 10/15/2018 0859   GFRNONAA 57 (L) 10/15/2018 0859   GFRAA 66 10/15/2018 0859   Lab Results  Component Value Date   HGBA1C 5.2 10/15/2018   Lab Results  Component Value Date   INSULIN 13.8 10/15/2018   INSULIN 16.4 07/03/2018   CBC    Component Value Date/Time   WBC 7.8 07/02/2012 0510   RBC 4.79 07/02/2012 0510   HGB 14.4  07/02/2012 0510   HCT 40.9 07/02/2012 0510   PLT 165 07/02/2012 0510   MCV 85.4 07/02/2012 0510   MCH 30.1 07/02/2012 0510   MCHC 35.2 07/02/2012 0510   RDW 12.9 07/02/2012 0510   LYMPHSABS 2.7 12/27/2008 1722   MONOABS 0.4 12/27/2008 1722   EOSABS 0.1 12/27/2008 1722   BASOSABS 0.0 12/27/2008 1722   Iron/TIBC/Ferritin/ %Sat No results found for: IRON, TIBC, FERRITIN, IRONPCTSAT Lipid Panel  No results found for: CHOL, TRIG, HDL, CHOLHDL, VLDL, LDLCALC, LDLDIRECT Hepatic Function Panel     Component Value Date/Time   PROT 6.6 10/15/2018 0859   ALBUMIN 4.5 10/15/2018 0859   AST 20 10/15/2018 0859   ALT 29 10/15/2018 0859   ALKPHOS 99 10/15/2018 0859   BILITOT 0.5 10/15/2018 0859   BILIDIR <0.1 07/02/2012 0521   IBILI NOT CALCULATED 07/02/2012 0521      Component Value Date/Time   TSH 1.470 07/03/2018 1105      OBESITY BEHAVIORAL INTERVENTION VISIT  Today's visit was # 13   Starting weight: 183 lbs Starting date: 07/03/18 Today's weight : 164 lbs  Today's date: 01/07/2019 Total lbs lost to date: 97    ASK: We discussed the diagnosis of obesity with Reuel Boom today and Judeen Hammans agreed to give [place] permission to discuss obesity behavioral modification therapy today.  ASSESS: Canyon has the diagnosis of obesity and her BMI today is 28.14 Jabria is in the action stage of change   ADVISE: Ayshia was educated on the multiple health risks of obesity as well as the benefit of weight loss to improve her health. She was advised of the need for long term treatment and the importance of lifestyle modifications to improve her current health and to decrease her risk of future health problems.  AGREE: Multiple dietary modification options and treatment options were discussed and  Leighanna agreed to follow  the recommendations documented in the above note.  ARRANGE: Mallie was educated on the importance of frequent visits to treat obesity as outlined per CMS and USPSTF  guidelines and agreed to schedule her next follow up appointment today.  I, Trixie Dredge, am acting as transcriptionist for Ilene Qua, MD  I have reviewed the above documentation for accuracy and completeness, and I agree with the above. - Ilene Qua, MD

## 2019-01-28 ENCOUNTER — Encounter (INDEPENDENT_AMBULATORY_CARE_PROVIDER_SITE_OTHER): Payer: Self-pay | Admitting: Family Medicine

## 2019-01-28 ENCOUNTER — Ambulatory Visit (INDEPENDENT_AMBULATORY_CARE_PROVIDER_SITE_OTHER): Payer: BC Managed Care – PPO | Admitting: Family Medicine

## 2019-01-28 VITALS — BP 132/79 | HR 50 | Temp 97.6°F | Ht 64.0 in | Wt 165.0 lb

## 2019-01-28 DIAGNOSIS — E559 Vitamin D deficiency, unspecified: Secondary | ICD-10-CM | POA: Diagnosis not present

## 2019-01-28 DIAGNOSIS — E669 Obesity, unspecified: Secondary | ICD-10-CM

## 2019-01-28 DIAGNOSIS — Z6830 Body mass index (BMI) 30.0-30.9, adult: Secondary | ICD-10-CM | POA: Diagnosis not present

## 2019-01-28 DIAGNOSIS — E7849 Other hyperlipidemia: Secondary | ICD-10-CM

## 2019-01-28 DIAGNOSIS — I10 Essential (primary) hypertension: Secondary | ICD-10-CM | POA: Diagnosis not present

## 2019-01-28 DIAGNOSIS — Z9189 Other specified personal risk factors, not elsewhere classified: Secondary | ICD-10-CM | POA: Diagnosis not present

## 2019-01-29 NOTE — Progress Notes (Signed)
Office: [phone]  /  Fax: [phone]   HPI:   Chief Complaint: OBESITY Julie Moran is here to discuss her progress with her obesity treatment plan. She is on the keep a food journal with 1150-1250 calories and 85+ grams of protein daily and is following her eating plan approximately 75 % of the time. She states she is swimming and weight training for 40 minutes 4 times per week. Julie Moran reports she feels she has been eating better in terms of logging food and hitting protein goal daily. She has not logged in 3 days and has gone over 1300 calories for approximately 4 days. She has been swimming fairly consistently as well as water aerobics and free weights.  Her weight is 165 lb (74.8 kg) today and has gained 1 pound since her last visit. She has lost 18 lbs since starting treatment with [place].  Hypertension Julie Moran is a 62 y.o. female with hypertension. Julie Moran's blood pressure is controlled. She denies chest pain, chest pressure, or headaches. She is working on weight loss to help control her blood pressure with the goal of decreasing her risk of heart attack and stroke.   Hyperlipidemia Julie Moran has hyperlipidemia and has been trying to improve her cholesterol levels with intensive lifestyle modification including a low saturated fat diet, exercise and weight loss. She is on pravastatin and denies any chest pain, claudication or myalgias.  Vitamin D Deficiency Julie Moran has a diagnosis of vitamin D deficiency. She would like to stop taking prescription Vit D. She is taking multivitamins. She denies nausea, vomiting or muscle weakness.  At risk for osteopenia and osteoporosis Julie Moran is at higher risk of osteopenia and osteoporosis due to vitamin D deficiency.   ASSESSMENT AND PLAN:  Essential hypertension  Other hyperlipidemia  Vitamin D deficiency  At risk for osteoporosis  Class 1 obesity with serious comorbidity and body mass index (BMI) of 30.0 to 30.9 in adult, unspecified  obesity type - Starting BMI greater then 30  PLAN:  Hypertension We discussed sodium restriction, working on healthy weight loss, and a regular exercise program as the means to achieve improved blood pressure control. Julie Moran agreed with this plan and agreed to follow up as directed. We will continue to monitor her blood pressure as well as her progress with the above lifestyle modifications. Julie Moran agrees to continue taking amlodipine and will watch for signs of hypotension as she continues her lifestyle modifications. Julie Moran agrees to follow up with our clinic in 3 weeks.  Hyperlipidemia Julie Moran was informed of the American Heart Association Guidelines emphasizing intensive lifestyle modifications as the first line treatment for hyperlipidemia. We discussed many lifestyle modifications today in depth, and Julie Moran will continue to work on decreasing saturated fats such as fatty red meat, butter and many fried foods. Julie Moran agrees to continue taking pravastatin and she will also increase vegetables and lean protein in her diet, and continue to work on exercise and weight loss efforts. Julie Moran agrees to follow up with our clinic in 3 weeks.  Vitamin D Deficiency Julie Moran was informed that low vitamin D levels contributes to fatigue and are associated with obesity, breast, and colon cancer. Julie Moran agrees to stop prescription Vit D and start OTC 5,000 IU daily. She will follow up for routine testing of vitamin D, at least 2-3 times per year. She was informed of the risk of over-replacement of vitamin D and agrees to not increase her dose unless she discusses this with [place] first. Julie Moran agrees to follow up  with our clinic in 3 weeks.  At risk for osteopenia and osteoporosis Julie Moran was given extended (15 minutes) osteoporosis prevention counseling today. Julie Moran is at risk for osteopenia and osteoporsis due to her vitamin D deficiency. She was encouraged to take her vitamin D and follow her higher calcium diet and  increase strengthening exercise to help strengthen her bones and decrease her risk of osteopenia and osteoporosis.  Obesity Julie Moran is currently in the action stage of change. As such, her goal is to continue with weight loss efforts She has agreed to keep a food journal with 1150-1250 calories and 75+ grams of protein daily Julie Moran has been instructed to work up to a goal of 150 minutes of combined cardio and strengthening exercise per week for weight loss and overall health benefits. We discussed the following Behavioral Modification Strategies today: increasing lean protein intake, increasing vegetables and work on meal planning and easy cooking plans, and planning for success   Julie Moran has agreed to follow up with our clinic in 3 weeks. She was informed of the importance of frequent follow up visits to maximize her success with intensive lifestyle modifications for her multiple health conditions.  ALLERGIES: Allergies  Allergen Reactions  . No Known Allergies     MEDICATIONS: Current Outpatient Medications on File Prior to Visit  Medication Sig Dispense Refill  . amLODipine (NORVASC) 5 MG tablet Take 1 tablet by mouth Daily. Take 1/2 daily    . Nutritional Supplements (JUICE PLUS FIBRE PO) Take 2 tablets by mouth 3 (three) times daily.    . pravastatin (PRAVACHOL) 20 MG tablet     . Vitamin D, Ergocalciferol, (DRISDOL) 1.25 MG (50000 UT) CAPS capsule Take 1 capsule (50,000 Units total) by mouth every 7 (seven) days. 4 capsule 0   No current facility-administered medications on file prior to visit.     PAST MEDICAL HISTORY: Past Medical History:  Diagnosis Date  . Allergy   . Atrophic vaginitis   . Birt-Hogg-Dube syndrome    lung cysts   . Birt-Hogg-Dube syndrome   . Chest pain   . Colon polyps    adenomatous  . Constipation   . Depression   . Diverticulosis   . Dry skin   . Fatty liver   . Floaters   . Gall bladder disease   . Glaucoma   . Heart murmur   . Herpes  simplex   . Hyperlipidemia   . Hypertension   . Joint pain   . Lichen sclerosus   . Pancreatitis   . Pancreatitis   . Pneumothorax, right    spontaneous  . Shortness of breath   . Shortness of breath on exertion   . Swelling of both lower extremities   . Trouble in sleeping     PAST SURGICAL HISTORY: Past Surgical History:  Procedure Laterality Date  . BREAST BIOPSY  03/29/2006  . BREAST BIOPSY Bilateral 06/14/1998  . BREAST EXCISIONAL BIOPSY Bilateral 1999  . BREAST LUMPECTOMY  [phone]   x5 times total , both breasts  . CHOLECYSTECTOMY    . COLONOSCOPY    . hysterctomy     abdominal  . POLYPECTOMY    . right ovary removed    . UPPER GASTROINTESTINAL ENDOSCOPY      SOCIAL HISTORY: Social History   Tobacco Use  . Smoking status: Never Smoker  . Smokeless tobacco: Never Used  Substance Use Topics  . Alcohol use: No    Alcohol/week: 0.0 standard drinks  . Drug use:  No    FAMILY HISTORY: Family History  Problem Relation Age of Onset  . Diabetes Mother   . Breast cancer Mother   . CVA Mother   . Hypertension Mother   . Heart disease Mother   . Thyroid disease Mother   . Cancer Mother   . Diabetes Father   . Prostate cancer Father   . Heart disease Father   . Hypertension Father   . Hyperlipidemia Father   . Thyroid disease Father   . Cancer Father   . Obesity Father   . Liver disease Paternal Grandmother   . Breast cancer Maternal Aunt        x 3  . CVA Paternal Grandfather   . Colon cancer Neg Hx   . Esophageal cancer Neg Hx   . Stomach cancer Neg Hx   . Rectal cancer Neg Hx     ROS: Review of Systems  Constitutional: Negative for weight loss.  Cardiovascular: Negative for chest pain and claudication.       Negative chest pressure  Gastrointestinal: Negative for nausea and vomiting.  Musculoskeletal: Negative for myalgias.       Negative muscle weakness  Neurological: Negative for headaches.    PHYSICAL EXAM: Blood pressure  132/79, pulse (!) 50, temperature 97.6 F (36.4 C), temperature source Oral, height 5\' 4"  (1.626 m), weight 165 lb (74.8 kg), SpO2 98 %. Body mass index is 28.32 kg/m. Physical Exam Vitals signs reviewed.  Constitutional:      Appearance: Normal appearance. She is obese.  Cardiovascular:     Rate and Rhythm: Normal rate.     Pulses: Normal pulses.  Pulmonary:     Effort: Pulmonary effort is normal.     Breath sounds: Normal breath sounds.  Musculoskeletal: Normal range of motion.  Skin:    General: Skin is warm and dry.  Neurological:     Mental Status: She is alert and oriented to person, place, and time.  Psychiatric:        Mood and Affect: Mood normal.        Behavior: Behavior normal.     RECENT LABS AND TESTS: BMET    Component Value Date/Time   NA 143 10/15/2018 0859   K 4.3 10/15/2018 0859   CL 104 10/15/2018 0859   CO2 24 10/15/2018 0859   GLUCOSE 110 (H) 10/15/2018 0859   GLUCOSE 106 (H) 07/02/2012 0510   BUN 21 10/15/2018 0859   CREATININE 1.04 (H) 10/15/2018 0859   CALCIUM 9.5 10/15/2018 0859   GFRNONAA 57 (L) 10/15/2018 0859   GFRAA 66 10/15/2018 0859   Lab Results  Component Value Date   HGBA1C 5.2 10/15/2018   Lab Results  Component Value Date   INSULIN 13.8 10/15/2018   INSULIN 16.4 07/03/2018   CBC    Component Value Date/Time   WBC 7.8 07/02/2012 0510   RBC 4.79 07/02/2012 0510   HGB 14.4 07/02/2012 0510   HCT 40.9 07/02/2012 0510   PLT 165 07/02/2012 0510   MCV 85.4 07/02/2012 0510   MCH 30.1 07/02/2012 0510   MCHC 35.2 07/02/2012 0510   RDW 12.9 07/02/2012 0510   LYMPHSABS 2.7 12/27/2008 1722   MONOABS 0.4 12/27/2008 1722   EOSABS 0.1 12/27/2008 1722   BASOSABS 0.0 12/27/2008 1722   Iron/TIBC/Ferritin/ %Sat No results found for: IRON, TIBC, FERRITIN, IRONPCTSAT Lipid Panel  No results found for: CHOL, TRIG, HDL, CHOLHDL, VLDL, LDLCALC, LDLDIRECT Hepatic Function Panel     Component Value Date/Time  PROT 6.6 10/15/2018 0859    ALBUMIN 4.5 10/15/2018 0859   AST 20 10/15/2018 0859   ALT 29 10/15/2018 0859   ALKPHOS 99 10/15/2018 0859   BILITOT 0.5 10/15/2018 0859   BILIDIR <0.1 07/02/2012 0521   IBILI NOT CALCULATED 07/02/2012 0521      Component Value Date/Time   TSH 1.470 07/03/2018 1105      OBESITY BEHAVIORAL INTERVENTION VISIT  Today's visit was # 14   Starting weight: 183 lbs Starting date: 07/03/18 Today's weight : 165 lbs  Today's date: 01/28/2019 Total lbs lost to date: 61    ASK: We discussed the diagnosis of obesity with Reuel Boom today and Judeen Hammans agreed to give [place] permission to discuss obesity behavioral modification therapy today.  ASSESS: Jaquisha has the diagnosis of obesity and her BMI today is 28.31 Zoriana is in the action stage of change   ADVISE: Yaira was educated on the multiple health risks of obesity as well as the benefit of weight loss to improve her health. She was advised of the need for long term treatment and the importance of lifestyle modifications to improve her current health and to decrease her risk of future health problems.  AGREE: Multiple dietary modification options and treatment options were discussed and  Azyiah agreed to follow the recommendations documented in the above note.  ARRANGE: Aarica was educated on the importance of frequent visits to treat obesity as outlined per CMS and USPSTF guidelines and agreed to schedule her next follow up appointment today.  I, Trixie Dredge, am acting as transcriptionist for Ilene Qua, MD  I have reviewed the above documentation for accuracy and completeness, and I agree with the above. - Ilene Qua, MD

## 2019-02-18 ENCOUNTER — Other Ambulatory Visit: Payer: Self-pay

## 2019-02-18 ENCOUNTER — Ambulatory Visit (INDEPENDENT_AMBULATORY_CARE_PROVIDER_SITE_OTHER): Payer: BC Managed Care – PPO | Admitting: Family Medicine

## 2019-02-18 VITALS — BP 127/67 | HR 57 | Temp 97.5°F | Ht 64.0 in | Wt 163.0 lb

## 2019-02-18 DIAGNOSIS — E669 Obesity, unspecified: Secondary | ICD-10-CM | POA: Diagnosis not present

## 2019-02-18 DIAGNOSIS — E559 Vitamin D deficiency, unspecified: Secondary | ICD-10-CM

## 2019-02-18 DIAGNOSIS — I10 Essential (primary) hypertension: Secondary | ICD-10-CM | POA: Diagnosis not present

## 2019-02-18 DIAGNOSIS — Z6830 Body mass index (BMI) 30.0-30.9, adult: Secondary | ICD-10-CM

## 2019-02-18 NOTE — Progress Notes (Signed)
Office: [phone]  /  Fax: [phone]   HPI:   Chief Complaint: OBESITY Julie Moran is here to discuss her progress with her obesity treatment plan. She is on the keep a food journal with 1150-1250 calories and 75+ grams of protein daily and is following her eating plan approximately 70 % of the time. She states she is exercising 0 minutes 0 times per week. Julie Moran was sick and traveling for the most of interim between visits. She has been keeping track of calories and protein. Calories ranging between 512 and 1423, and protein ranging between 64 and 133 grams. She has been measuring everything.  Her weight is 163 lb (73.9 kg) today and has had a weight loss of 2 pounds over a period of 3 weeks since her last visit. She has lost 20 lbs since starting treatment with [place].  Vitamin D Deficiency Julie Moran has a diagnosis of vitamin D deficiency. She is currently taking prescription Vit D. She notes fatigue and denies nausea, vomiting or muscle weakness.  Hypertension Julie Moran is a 63 y.o. female with hypertension. Julie Moran's blood pressure is controlled. She denies chest pain, chest pressure, or headaches. She is working on weight loss to help control her blood pressure with the goal of decreasing her risk of heart attack and stroke.   ASSESSMENT AND PLAN:  Vitamin D deficiency  Essential hypertension  Class 1 obesity with serious comorbidity and body mass index (BMI) of 30.0 to 30.9 in adult, unspecified obesity type - Starting BMI greater then 30  PLAN:  Vitamin D Deficiency Julie Moran was informed that low vitamin D levels contributes to fatigue and are associated with obesity, breast, and colon cancer. Kharisma agrees to continue taking prescription Vit D @50 ,000 IU every week, no refill needed. She will follow up for routine testing of vitamin D, at least 2-3 times per year. She was informed of the risk of over-replacement of vitamin D and agrees to not increase her dose unless she  discusses this with [place] first. Julie Moran agrees to follow up with our clinic in 3 weeks.  Hypertension We discussed sodium restriction, working on healthy weight loss, and a regular exercise program as the means to achieve improved blood pressure control. Julie Moran agreed with this plan and agreed to follow up as directed. We will continue to monitor her blood pressure as well as her progress with the above lifestyle modifications. Julie Moran agrees to continue taking amlodipine, no refill needed, and will watch for signs of hypotension as she continues her lifestyle modifications. Julie Moran agrees to follow up with our clinic in 3 weeks.  I spent > than 50% of the 15 minute visit on counseling as documented in the note.  Obesity Julie Moran is currently in the action stage of change. As such, her goal is to continue with weight loss efforts She has agreed to follow the Category 2 plan or follow the [place] eating plan Julie Moran has been instructed to work up to a goal of 150 minutes of combined cardio and strengthening exercise per week for weight loss and overall health benefits. She is to plan to walk and swim throughout the next 2 weeks. We discussed the following Behavioral Modification Strategies today: increasing lean protein intake, no skipping meals, better snacking choices, planning for success, and keep a strict food journal   Julie Moran has agreed to follow up with our clinic in 3 weeks. She was informed of the importance of frequent follow up visits to maximize her success with intensive lifestyle  modifications for her multiple health conditions.  ALLERGIES: Allergies  Allergen Reactions  . No Known Allergies     MEDICATIONS: Current Outpatient Medications on File Prior to Visit  Medication Sig Dispense Refill  . amLODipine (NORVASC) 5 MG tablet Take 1 tablet by mouth Daily. Take 1/2 daily    . Nutritional Supplements (JUICE PLUS FIBRE PO) Take 2 tablets by mouth 3 (three) times daily.    .  pravastatin (PRAVACHOL) 20 MG tablet     . Vitamin D, Ergocalciferol, (DRISDOL) 1.25 MG (50000 UT) CAPS capsule Take 1 capsule (50,000 Units total) by mouth every 7 (seven) days. 4 capsule 0   No current facility-administered medications on file prior to visit.     PAST MEDICAL HISTORY: Past Medical History:  Diagnosis Date  . Allergy   . Atrophic vaginitis   . Birt-Hogg-Dube syndrome    lung cysts   . Birt-Hogg-Dube syndrome   . Chest pain   . Colon polyps    adenomatous  . Constipation   . Depression   . Diverticulosis   . Dry skin   . Fatty liver   . Floaters   . Gall bladder disease   . Glaucoma   . Heart murmur   . Herpes simplex   . Hyperlipidemia   . Hypertension   . Joint pain   . Lichen sclerosus   . Pancreatitis   . Pancreatitis   . Pneumothorax, right    spontaneous  . Shortness of breath   . Shortness of breath on exertion   . Swelling of both lower extremities   . Trouble in sleeping     PAST SURGICAL HISTORY: Past Surgical History:  Procedure Laterality Date  . BREAST BIOPSY  03/29/2006  . BREAST BIOPSY Bilateral 06/14/1998  . BREAST EXCISIONAL BIOPSY Bilateral 1999  . BREAST LUMPECTOMY  [phone]   x5 times total , both breasts  . CHOLECYSTECTOMY    . COLONOSCOPY    . hysterctomy     abdominal  . POLYPECTOMY    . right ovary removed    . UPPER GASTROINTESTINAL ENDOSCOPY      SOCIAL HISTORY: Social History   Tobacco Use  . Smoking status: Never Smoker  . Smokeless tobacco: Never Used  Substance Use Topics  . Alcohol use: No    Alcohol/week: 0.0 standard drinks  . Drug use: No    FAMILY HISTORY: Family History  Problem Relation Age of Onset  . Diabetes Mother   . Breast cancer Mother   . CVA Mother   . Hypertension Mother   . Heart disease Mother   . Thyroid disease Mother   . Cancer Mother   . Diabetes Father   . Prostate cancer Father   . Heart disease Father   . Hypertension Father   . Hyperlipidemia Father   .  Thyroid disease Father   . Cancer Father   . Obesity Father   . Liver disease Paternal Grandmother   . Breast cancer Maternal Aunt        x 3  . CVA Paternal Grandfather   . Colon cancer Neg Hx   . Esophageal cancer Neg Hx   . Stomach cancer Neg Hx   . Rectal cancer Neg Hx     ROS: Review of Systems  Constitutional: Positive for malaise/fatigue and weight loss.  Cardiovascular: Negative for chest pain.       Negative chest pressure  Gastrointestinal: Negative for nausea and vomiting.  Musculoskeletal:  Negative muscle weakness  Neurological: Negative for headaches.    PHYSICAL EXAM: Blood pressure 127/67, pulse (!) 57, temperature (!) 97.5 F (36.4 C), temperature source Oral, height 5\' 4"  (1.626 m), weight 163 lb (73.9 kg), SpO2 97 %. Body mass index is 27.98 kg/m. Physical Exam Vitals signs reviewed.  Constitutional:      Appearance: Normal appearance. She is obese.  Cardiovascular:     Rate and Rhythm: Normal rate.     Pulses: Normal pulses.  Pulmonary:     Effort: Pulmonary effort is normal.     Breath sounds: Normal breath sounds.  Musculoskeletal: Normal range of motion.  Skin:    General: Skin is warm and dry.  Neurological:     Mental Status: She is alert and oriented to person, place, and time.  Psychiatric:        Mood and Affect: Mood normal.        Behavior: Behavior normal.     RECENT LABS AND TESTS: BMET    Component Value Date/Time   NA 143 10/15/2018 0859   K 4.3 10/15/2018 0859   CL 104 10/15/2018 0859   CO2 24 10/15/2018 0859   GLUCOSE 110 (H) 10/15/2018 0859   GLUCOSE 106 (H) 07/02/2012 0510   BUN 21 10/15/2018 0859   CREATININE 1.04 (H) 10/15/2018 0859   CALCIUM 9.5 10/15/2018 0859   GFRNONAA 57 (L) 10/15/2018 0859   GFRAA 66 10/15/2018 0859   Lab Results  Component Value Date   HGBA1C 5.2 10/15/2018   Lab Results  Component Value Date   INSULIN 13.8 10/15/2018   INSULIN 16.4 07/03/2018   CBC    Component Value  Date/Time   WBC 7.8 07/02/2012 0510   RBC 4.79 07/02/2012 0510   HGB 14.4 07/02/2012 0510   HCT 40.9 07/02/2012 0510   PLT 165 07/02/2012 0510   MCV 85.4 07/02/2012 0510   MCH 30.1 07/02/2012 0510   MCHC 35.2 07/02/2012 0510   RDW 12.9 07/02/2012 0510   LYMPHSABS 2.7 12/27/2008 1722   MONOABS 0.4 12/27/2008 1722   EOSABS 0.1 12/27/2008 1722   BASOSABS 0.0 12/27/2008 1722   Iron/TIBC/Ferritin/ %Sat No results found for: IRON, TIBC, FERRITIN, IRONPCTSAT Lipid Panel  No results found for: CHOL, TRIG, HDL, CHOLHDL, VLDL, LDLCALC, LDLDIRECT Hepatic Function Panel     Component Value Date/Time   PROT 6.6 10/15/2018 0859   ALBUMIN 4.5 10/15/2018 0859   AST 20 10/15/2018 0859   ALT 29 10/15/2018 0859   ALKPHOS 99 10/15/2018 0859   BILITOT 0.5 10/15/2018 0859   BILIDIR <0.1 07/02/2012 0521   IBILI NOT CALCULATED 07/02/2012 0521      Component Value Date/Time   TSH 1.470 07/03/2018 1105      OBESITY BEHAVIORAL INTERVENTION VISIT  Today's visit was # 15   Starting weight: 183 lbs Starting date: 07/03/18 Today's weight : 163 lbs Today's date: 02/18/2019 Total lbs lost to date: 20    02/18/2019  Height 5\' 4"  (1.626 m)  Weight 163 lb (73.9 kg)  BMI (Calculated) 27.97  BLOOD PRESSURE - SYSTOLIC 716  BLOOD PRESSURE - DIASTOLIC 67   Body Fat % 96.7 %  Total Body Water (lbs) 65.4 lbs     ASK: We discussed the diagnosis of obesity with Reuel Boom today and Adelheid agreed to give [place] permission to discuss obesity behavioral modification therapy today.  ASSESS: Camira has the diagnosis of obesity and her BMI today is 27.97 Aylanie is in the action stage of  change   ADVISE: Paola was educated on the multiple health risks of obesity as well as the benefit of weight loss to improve her health. She was advised of the need for long term treatment and the importance of lifestyle modifications to improve her current health and to decrease her risk of future health problems.   AGREE: Multiple dietary modification options and treatment options were discussed and  Skylan agreed to follow the recommendations documented in the above note.  ARRANGE: Berlinda was educated on the importance of frequent visits to treat obesity as outlined per CMS and USPSTF guidelines and agreed to schedule her next follow up appointment today.  I, Trixie Dredge, am acting as transcriptionist for Ilene Qua, MD  I have reviewed the above documentation for accuracy and completeness, and I agree with the above. - Ilene Qua, MD

## 2019-03-04 ENCOUNTER — Encounter (INDEPENDENT_AMBULATORY_CARE_PROVIDER_SITE_OTHER): Payer: Self-pay | Admitting: Family Medicine

## 2019-03-04 ENCOUNTER — Encounter (INDEPENDENT_AMBULATORY_CARE_PROVIDER_SITE_OTHER): Payer: Self-pay

## 2019-03-11 ENCOUNTER — Other Ambulatory Visit: Payer: Self-pay

## 2019-03-11 ENCOUNTER — Ambulatory Visit (INDEPENDENT_AMBULATORY_CARE_PROVIDER_SITE_OTHER): Payer: BC Managed Care – PPO | Admitting: Family Medicine

## 2019-03-11 ENCOUNTER — Encounter (INDEPENDENT_AMBULATORY_CARE_PROVIDER_SITE_OTHER): Payer: Self-pay | Admitting: Family Medicine

## 2019-03-11 DIAGNOSIS — E669 Obesity, unspecified: Secondary | ICD-10-CM

## 2019-03-11 DIAGNOSIS — Z6830 Body mass index (BMI) 30.0-30.9, adult: Secondary | ICD-10-CM | POA: Diagnosis not present

## 2019-03-11 DIAGNOSIS — H538 Other visual disturbances: Secondary | ICD-10-CM | POA: Diagnosis not present

## 2019-03-11 DIAGNOSIS — E559 Vitamin D deficiency, unspecified: Secondary | ICD-10-CM | POA: Diagnosis not present

## 2019-03-12 NOTE — Progress Notes (Signed)
Office: [phone]  /  Fax: [phone] TeleHealth Visit:  Julie Moran has verbally consented to this TeleHealth visit today. The patient is located at home, the provider is located at the [facility] office. The participants in this visit include the listed provider and patient and provider's assistant. The visit was conducted today via video on Facetime.  HPI:   Chief Complaint: OBESITY Julie Moran is here to discuss her progress with her obesity treatment plan. She is on the Category 2 plan or follow the Pescatarian eating plan and is following her eating plan approximately 40 % of the time. She states she is exercising 0 minutes 0 times per week. Julie Moran voices her first 2 weeks were not following the plan. She didn't track and didn't follow a plan. She has been successfully tracking and hitting goals daily. She is having fairly significant blurry vision this morning. She has weighed the same. She hasn't done much physical activity, hasn't been motivated.  We were unable to weigh the patient today for this TeleHealth visit. She feels as if she has maintained her weight since her last visit. She has lost 20 lbs since starting treatment with [place].  Julie Moran woke up with blurry vision this morning. She has a diagnosis of eye problems requiring wipes.  Vitamin D Deficiency Julie Moran has a diagnosis of vitamin D deficiency. She is currently taking prescription Vit D. She notes fatigue and denies nausea, vomiting or muscle weakness.  ASSESSMENT AND PLAN:  Blurry vision  Vitamin D deficiency  Class 1 obesity with serious comorbidity and body mass index (BMI) of 30.0 to 30.9 in adult, unspecified obesity type  PLAN:  Blurry Vision Julie Moran is to call her eye doctor and primary care physician to discuss her blurry vision. She is encouraged to do so today after this appointment.  Vitamin D Deficiency Julie Moran was informed that low vitamin D levels contributes to  fatigue and are associated with obesity, breast, and colon cancer. Julie Moran agrees to continue taking prescription Vit D @50 ,000 IU every week and will follow up for routine testing of vitamin D, at least 2-3 times per year. She was informed of the risk of over-replacement of vitamin D and agrees to not increase her dose unless she discusses this with [place] first. Julie Moran agrees to follow up with our clinic in 2 weeks.  Obesity Julie Moran is currently in the action stage of change. As such, her goal is to continue with weight loss efforts She has agreed to keep a food journal with 1150-1250 calories and 75+ grams of protein daily Julie Moran has been instructed to work up to a goal of 150 minutes of combined cardio and strengthening exercise per week for weight loss and overall health benefits. We discussed the following Behavioral Modification Strategies today: increasing lean protein intake, increasing vegetables and work on meal planning and easy cooking plans, and planning for success   Julie Moran has agreed to follow up with our clinic in 2 weeks. She was informed of the importance of frequent follow up visits to maximize her success with intensive lifestyle modifications for her multiple health conditions.  ALLERGIES: Allergies  Allergen Reactions  . No Known Allergies     MEDICATIONS: Current Outpatient Medications on File Prior to Visit  Medication Sig Dispense Refill  . amLODipine (NORVASC) 5 MG tablet Take 1 tablet by mouth Daily. Take 1/2 daily    . Nutritional Supplements (JUICE PLUS FIBRE PO) Take 2 tablets by mouth 3 (three) times  daily.    . pravastatin (PRAVACHOL) 20 MG tablet     . Vitamin D, Ergocalciferol, (DRISDOL) 1.25 MG (50000 UT) CAPS capsule Take 1 capsule (50,000 Units total) by mouth every 7 (seven) days. 4 capsule 0   No current facility-administered medications on file prior to visit.     PAST MEDICAL HISTORY: Past Medical History:  Diagnosis Date  . Allergy   . Atrophic  vaginitis   . Birt-Hogg-Dube syndrome    lung cysts   . Birt-Hogg-Dube syndrome   . Chest pain   . Colon polyps    adenomatous  . Constipation   . Depression   . Diverticulosis   . Dry skin   . Fatty liver   . Floaters   . Gall bladder disease   . Glaucoma   . Heart murmur   . Herpes simplex   . Hyperlipidemia   . Hypertension   . Joint pain   . Lichen sclerosus   . Pancreatitis   . Pancreatitis   . Pneumothorax, right    spontaneous  . Shortness of breath   . Shortness of breath on exertion   . Swelling of both lower extremities   . Trouble in sleeping     PAST SURGICAL HISTORY: Past Surgical History:  Procedure Laterality Date  . BREAST BIOPSY  03/29/2006  . BREAST BIOPSY Bilateral 06/14/1998  . BREAST EXCISIONAL BIOPSY Bilateral 1999  . BREAST LUMPECTOMY  [phone]   x5 times total , both breasts  . CHOLECYSTECTOMY    . COLONOSCOPY    . hysterctomy     abdominal  . POLYPECTOMY    . right ovary removed    . UPPER GASTROINTESTINAL ENDOSCOPY      SOCIAL HISTORY: Social History   Tobacco Use  . Smoking status: Never Smoker  . Smokeless tobacco: Never Used  Substance Use Topics  . Alcohol use: No    Alcohol/week: 0.0 standard drinks  . Drug use: No    FAMILY HISTORY: Family History  Problem Relation Age of Onset  . Diabetes Mother   . Breast cancer Mother   . CVA Mother   . Hypertension Mother   . Heart disease Mother   . Thyroid disease Mother   . Cancer Mother   . Diabetes Father   . Prostate cancer Father   . Heart disease Father   . Hypertension Father   . Hyperlipidemia Father   . Thyroid disease Father   . Cancer Father   . Obesity Father   . Liver disease Paternal Grandmother   . Breast cancer Maternal Aunt        x 3  . CVA Paternal Grandfather   . Colon cancer Neg Hx   . Esophageal cancer Neg Hx   . Stomach cancer Neg Hx   . Rectal cancer Neg Hx     ROS: Review of Systems  Constitutional: Positive for  malaise/fatigue. Negative for weight loss.  Eyes: Positive for blurred vision.  Gastrointestinal: Negative for nausea and vomiting.  Musculoskeletal:       Negative muscle weakness    PHYSICAL EXAM: Pt in no acute distress  RECENT LABS AND TESTS: BMET    Component Value Date/Time   NA 143 10/15/2018 0859   K 4.3 10/15/2018 0859   CL 104 10/15/2018 0859   CO2 24 10/15/2018 0859   GLUCOSE 110 (H) 10/15/2018 0859   GLUCOSE 106 (H) 07/02/2012 0510   BUN 21 10/15/2018 0859   CREATININE 1.04 (H) 10/15/2018  0859   CALCIUM 9.5 10/15/2018 0859   GFRNONAA 57 (L) 10/15/2018 0859   GFRAA 66 10/15/2018 0859   Lab Results  Component Value Date   HGBA1C 5.2 10/15/2018   Lab Results  Component Value Date   INSULIN 13.8 10/15/2018   INSULIN 16.4 07/03/2018   CBC    Component Value Date/Time   WBC 7.8 07/02/2012 0510   RBC 4.79 07/02/2012 0510   HGB 14.4 07/02/2012 0510   HCT 40.9 07/02/2012 0510   PLT 165 07/02/2012 0510   MCV 85.4 07/02/2012 0510   MCH 30.1 07/02/2012 0510   MCHC 35.2 07/02/2012 0510   RDW 12.9 07/02/2012 0510   LYMPHSABS 2.7 12/27/2008 1722   MONOABS 0.4 12/27/2008 1722   EOSABS 0.1 12/27/2008 1722   BASOSABS 0.0 12/27/2008 1722   Iron/TIBC/Ferritin/ %Sat No results found for: IRON, TIBC, FERRITIN, IRONPCTSAT Lipid Panel  No results found for: CHOL, TRIG, HDL, CHOLHDL, VLDL, LDLCALC, LDLDIRECT Hepatic Function Panel     Component Value Date/Time   PROT 6.6 10/15/2018 0859   ALBUMIN 4.5 10/15/2018 0859   AST 20 10/15/2018 0859   ALT 29 10/15/2018 0859   ALKPHOS 99 10/15/2018 0859   BILITOT 0.5 10/15/2018 0859   BILIDIR <0.1 07/02/2012 0521   IBILI NOT CALCULATED 07/02/2012 0521      Component Value Date/Time   TSH 1.470 07/03/2018 1105      I, Trixie Dredge, am acting as transcriptionist for Ilene Qua, MD  I have reviewed the above documentation for accuracy and completeness, and I agree with the above. - Ilene Qua, MD

## 2019-03-17 ENCOUNTER — Other Ambulatory Visit: Payer: Self-pay | Admitting: Internal Medicine

## 2019-03-17 DIAGNOSIS — Z1231 Encounter for screening mammogram for malignant neoplasm of breast: Secondary | ICD-10-CM

## 2019-03-25 ENCOUNTER — Encounter (INDEPENDENT_AMBULATORY_CARE_PROVIDER_SITE_OTHER): Payer: Self-pay | Admitting: Family Medicine

## 2019-03-25 ENCOUNTER — Ambulatory Visit (INDEPENDENT_AMBULATORY_CARE_PROVIDER_SITE_OTHER): Payer: BC Managed Care – PPO | Admitting: Family Medicine

## 2019-03-25 ENCOUNTER — Other Ambulatory Visit: Payer: Self-pay

## 2019-03-25 DIAGNOSIS — E669 Obesity, unspecified: Secondary | ICD-10-CM | POA: Diagnosis not present

## 2019-03-25 DIAGNOSIS — Z6830 Body mass index (BMI) 30.0-30.9, adult: Secondary | ICD-10-CM

## 2019-03-25 DIAGNOSIS — E559 Vitamin D deficiency, unspecified: Secondary | ICD-10-CM

## 2019-03-25 DIAGNOSIS — E66811 Obesity, class 1: Secondary | ICD-10-CM

## 2019-03-25 DIAGNOSIS — E8881 Metabolic syndrome: Secondary | ICD-10-CM

## 2019-03-25 DIAGNOSIS — E88819 Insulin resistance, unspecified: Secondary | ICD-10-CM

## 2019-03-30 ENCOUNTER — Encounter (INDEPENDENT_AMBULATORY_CARE_PROVIDER_SITE_OTHER): Payer: Self-pay | Admitting: Family Medicine

## 2019-03-31 NOTE — Progress Notes (Signed)
Office: [phone]  /  Fax: [phone] TeleHealth Visit:  Julie Moran has verbally consented to this TeleHealth visit today. The patient is located at home, the provider is located at the [facility] office. The participants in this visit include the listed provider and patient. The visit was conducted today via face time.  HPI:   Chief Complaint: OBESITY Julie Moran is here to discuss her progress with her obesity treatment plan. She is on the keep a food journal with 1150-1250 calories and 75+ grams of protein daily and is following her eating plan approximately 65 % of the time. She states she is walking for 20-25 minutes 3 times per week. Julie Moran is making charts for journaling. She is doing calories 50% of the time and protein about 75% of the time. She notes 3 days of low calories and 4 days of increased calories. Her stress level is better because she is getting out and walking. She is getting labs done within the next 2 weeks.  We were unable to weigh the patient today for this TeleHealth visit. She feels as if she has lost 2-3 lbs since her last visit. She has lost 20-23 lbs since starting treatment with [place].  Vitamin D Deficiency Julie Moran has a diagnosis of vitamin D deficiency. She is currently taking prescription Vit D. She notes fatigue and denies nausea, vomiting or muscle weakness.  Insulin Resistance Julie Moran has a diagnosis of insulin resistance based on her elevated fasting insulin level >5. Last insulin level was 13.8 on last labs and Hgb A1c was within normal limits. Although Julie Moran's blood glucose readings are still under good control, insulin resistance puts her at greater risk of metabolic syndrome and diabetes. She is not taking metformin currently and continues to work on diet and exercise to decrease risk of diabetes.  ASSESSMENT AND PLAN:  Vitamin D deficiency - Plan: VITAMIN D 25 Hydroxy (Vit-D Deficiency, Fractures)  Insulin resistance - Plan:  Insulin, random  Class 1 obesity with serious comorbidity and body mass index (BMI) of 30.0 to 30.9 in adult, unspecified obesity type - Starting BMI greater then 30  PLAN:  Vitamin D Deficiency Julie Moran was informed that low vitamin D levels contributes to fatigue and are associated with obesity, breast, and colon cancer. Julie Moran agrees to continue taking prescription Vit D @50 ,000 IU every week, no refill needed. She will follow up for routine testing of vitamin D, at least 2-3 times per year. She was informed of the risk of over-replacement of vitamin D and agrees to not increase her dose unless she discusses this with [place] first. We will repeat Vit D level. Sahasra agrees to follow up with our clinic in 2 weeks.  Insulin Resistance Julie Moran will continue to work on weight loss, exercise, and decreasing simple carbohydrates in her diet to help decrease the risk of diabetes. We dicussed metformin including benefits and risks. She was informed that eating too many simple carbohydrates or too many calories at one sitting increases the likelihood of GI side effects. Julie Moran declined metformin for now and prescription was not written today. We will follow up on labs from Dr. Shon Baton office and we will repeat insulin level. Julie Moran agrees to follow up with our clinic in 2 weeks as directed to monitor her progress.  Obesity Julie Moran is currently in the action stage of change. As such, her goal is to continue with weight loss efforts She has agreed to keep a food journal with 1200 calories and 80+ grams  of protein daily Kalan has been instructed to work up to a goal of 150 minutes of combined cardio and strengthening exercise per week for weight loss and overall health benefits. We discussed the following Behavioral Modification Strategies today: increasing lean protein intake, increasing vegetables, work on meal planning and easy cooking plans, emotional eating strategies, better snacking choices, and keep a  strict food journal   Julie Moran has agreed to follow up with our clinic in 2 weeks. She was informed of the importance of frequent follow up visits to maximize her success with intensive lifestyle modifications for her multiple health conditions.  ALLERGIES: Allergies  Allergen Reactions  . No Known Allergies     MEDICATIONS: Current Outpatient Medications on File Prior to Visit  Medication Sig Dispense Refill  . amLODipine (NORVASC) 5 MG tablet Take 1 tablet by mouth Daily. Take 1/2 daily    . Nutritional Supplements (JUICE PLUS FIBRE PO) Take 2 tablets by mouth 3 (three) times daily.    . pravastatin (PRAVACHOL) 20 MG tablet     . Vitamin D, Ergocalciferol, (DRISDOL) 1.25 MG (50000 UT) CAPS capsule Take 1 capsule (50,000 Units total) by mouth every 7 (seven) days. 4 capsule 0   No current facility-administered medications on file prior to visit.     PAST MEDICAL HISTORY: Past Medical History:  Diagnosis Date  . Allergy   . Atrophic vaginitis   . Birt-Hogg-Dube syndrome    lung cysts   . Birt-Hogg-Dube syndrome   . Chest pain   . Colon polyps    adenomatous  . Constipation   . Depression   . Diverticulosis   . Dry skin   . Fatty liver   . Floaters   . Gall bladder disease   . Glaucoma   . Heart murmur   . Herpes simplex   . Hyperlipidemia   . Hypertension   . Joint pain   . Lichen sclerosus   . Pancreatitis   . Pancreatitis   . Pneumothorax, right    spontaneous  . Shortness of breath   . Shortness of breath on exertion   . Swelling of both lower extremities   . Trouble in sleeping     PAST SURGICAL HISTORY: Past Surgical History:  Procedure Laterality Date  . BREAST BIOPSY  03/29/2006  . BREAST BIOPSY Bilateral 06/14/1998  . BREAST EXCISIONAL BIOPSY Bilateral 1999  . BREAST LUMPECTOMY  [phone]   x5 times total , both breasts  . CHOLECYSTECTOMY    . COLONOSCOPY    . hysterctomy     abdominal  . POLYPECTOMY    . right ovary removed    . UPPER  GASTROINTESTINAL ENDOSCOPY      SOCIAL HISTORY: Social History   Tobacco Use  . Smoking status: Never Smoker  . Smokeless tobacco: Never Used  Substance Use Topics  . Alcohol use: No    Alcohol/week: 0.0 standard drinks  . Drug use: No    FAMILY HISTORY: Family History  Problem Relation Age of Onset  . Diabetes Mother   . Breast cancer Mother   . CVA Mother   . Hypertension Mother   . Heart disease Mother   . Thyroid disease Mother   . Cancer Mother   . Diabetes Father   . Prostate cancer Father   . Heart disease Father   . Hypertension Father   . Hyperlipidemia Father   . Thyroid disease Father   . Cancer Father   . Obesity Father   .  Liver disease Paternal Grandmother   . Breast cancer Maternal Aunt        x 3  . CVA Paternal Grandfather   . Colon cancer Neg Hx   . Esophageal cancer Neg Hx   . Stomach cancer Neg Hx   . Rectal cancer Neg Hx     ROS: Review of Systems  Constitutional: Positive for malaise/fatigue and weight loss.  Gastrointestinal: Negative for nausea and vomiting.  Musculoskeletal:       Negative muscle weakness    PHYSICAL EXAM: Pt in no acute distress  RECENT LABS AND TESTS: BMET    Component Value Date/Time   NA 143 10/15/2018 0859   K 4.3 10/15/2018 0859   CL 104 10/15/2018 0859   CO2 24 10/15/2018 0859   GLUCOSE 110 (H) 10/15/2018 0859   GLUCOSE 106 (H) 07/02/2012 0510   BUN 21 10/15/2018 0859   CREATININE 1.04 (H) 10/15/2018 0859   CALCIUM 9.5 10/15/2018 0859   GFRNONAA 57 (L) 10/15/2018 0859   GFRAA 66 10/15/2018 0859   Lab Results  Component Value Date   HGBA1C 5.2 10/15/2018   Lab Results  Component Value Date   INSULIN 13.8 10/15/2018   INSULIN 16.4 07/03/2018   CBC    Component Value Date/Time   WBC 7.8 07/02/2012 0510   RBC 4.79 07/02/2012 0510   HGB 14.4 07/02/2012 0510   HCT 40.9 07/02/2012 0510   PLT 165 07/02/2012 0510   MCV 85.4 07/02/2012 0510   MCH 30.1 07/02/2012 0510   MCHC 35.2 07/02/2012  0510   RDW 12.9 07/02/2012 0510   LYMPHSABS 2.7 12/27/2008 1722   MONOABS 0.4 12/27/2008 1722   EOSABS 0.1 12/27/2008 1722   BASOSABS 0.0 12/27/2008 1722   Iron/TIBC/Ferritin/ %Sat No results found for: IRON, TIBC, FERRITIN, IRONPCTSAT Lipid Panel  No results found for: CHOL, TRIG, HDL, CHOLHDL, VLDL, LDLCALC, LDLDIRECT Hepatic Function Panel     Component Value Date/Time   PROT 6.6 10/15/2018 0859   ALBUMIN 4.5 10/15/2018 0859   AST 20 10/15/2018 0859   ALT 29 10/15/2018 0859   ALKPHOS 99 10/15/2018 0859   BILITOT 0.5 10/15/2018 0859   BILIDIR <0.1 07/02/2012 0521   IBILI NOT CALCULATED 07/02/2012 0521      Component Value Date/Time   TSH 1.470 07/03/2018 1105      I, Trixie Dredge, am acting as transcriptionist for Ilene Qua, MD  I have reviewed the above documentation for accuracy and completeness, and I agree with the above. - Ilene Qua, MD

## 2019-04-08 ENCOUNTER — Ambulatory Visit (INDEPENDENT_AMBULATORY_CARE_PROVIDER_SITE_OTHER): Payer: BC Managed Care – PPO | Admitting: Family Medicine

## 2019-04-09 ENCOUNTER — Encounter: Payer: Self-pay | Admitting: Family Medicine

## 2019-04-13 ENCOUNTER — Encounter (INDEPENDENT_AMBULATORY_CARE_PROVIDER_SITE_OTHER): Payer: Self-pay | Admitting: Family Medicine

## 2019-04-14 NOTE — Telephone Encounter (Signed)
Please review

## 2019-04-15 ENCOUNTER — Ambulatory Visit (INDEPENDENT_AMBULATORY_CARE_PROVIDER_SITE_OTHER): Payer: BC Managed Care – PPO | Admitting: Family Medicine

## 2019-04-15 ENCOUNTER — Other Ambulatory Visit: Payer: Self-pay

## 2019-04-15 ENCOUNTER — Encounter (INDEPENDENT_AMBULATORY_CARE_PROVIDER_SITE_OTHER): Payer: Self-pay | Admitting: Family Medicine

## 2019-04-15 DIAGNOSIS — Z6830 Body mass index (BMI) 30.0-30.9, adult: Secondary | ICD-10-CM

## 2019-04-15 DIAGNOSIS — E7849 Other hyperlipidemia: Secondary | ICD-10-CM | POA: Diagnosis not present

## 2019-04-15 DIAGNOSIS — E559 Vitamin D deficiency, unspecified: Secondary | ICD-10-CM | POA: Diagnosis not present

## 2019-04-15 DIAGNOSIS — E669 Obesity, unspecified: Secondary | ICD-10-CM | POA: Diagnosis not present

## 2019-04-15 NOTE — Progress Notes (Signed)
Office: [phone]  /  Fax: [phone] TeleHealth Visit:  Julie Moran has verbally consented to this TeleHealth visit today. The patient is located at home, the provider is located at the [facility] office. The participants in this visit include the listed provider and patient. The visit was conducted today via face time.  HPI:   Chief Complaint: OBESITY Julie Moran is here to discuss her progress with her obesity treatment plan. She is on the keep a food journal with 1200 calories and 80+ grams of protein daily and is following her eating plan approximately 50 % of the time. She states she is walking 7,000 steps 7 times per week. [name] is weighing in at 159 lbs. She has been sick with a cough and sore throat for about 3 weeks. She was assuming it has been COVID-19. She is on antibiotics for UTI. She has been able to eat somewhat in terms of following journaling.  We were unable to weigh the patient today for this TeleHealth visit. She feels as if she has lost 3 lbs since her last visit. She has lost 20-23 lbs since starting treatment with [place].  Vitamin D Deficiency [name] has a diagnosis of vitamin D deficiency. She is currently taking prescription Vit D. Last labs were on 10/15/18. She was ordered for recent labs done by her primary care physician, but no results available currently. She denies nausea, vomiting or muscle weakness.  Hyperlipidemia [name] has hyperlipidemia and has been trying to improve her cholesterol levels with intensive lifestyle modification including a low saturated fat diet, exercise and weight loss. Her recent LDL is 90, HDL 41, and triglycerides of 108. She is on pravastatin and denies any chest pain, claudication or myalgias.  ASSESSMENT AND PLAN:  Vitamin D deficiency  Other hyperlipidemia  Class 1 obesity with serious comorbidity and body mass index (BMI) of 30.0 to 30.9 in adult, unspecified obesity type - Starting BMI greater then 30   PLAN:  Vitamin D Deficiency [name] was informed that low vitamin D levels contributes to fatigue and are associated with obesity, breast, and colon cancer. [name] agrees to continue taking prescription Vit D @50 ,000 IU every week and will follow up for routine testing of vitamin D, at least 2-3 times per year. She was informed of the risk of over-replacement of vitamin D and agrees to not increase her dose unless she discusses this with [place] first. We will get in touch with Dr. Shon Baton office to get labs results. [name] agrees to follow up with our clinic in 2 weeks.  Hyperlipidemia [name] was informed of the American Heart Association Guidelines emphasizing intensive lifestyle modifications as the first line treatment for hyperlipidemia. We discussed many lifestyle modifications today in depth, and [name] will continue to work on decreasing saturated fats such as fatty red meat, butter and many fried foods. She will also increase vegetables and lean protein in her diet and continue to work on exercise and weight loss efforts. We will repeat labs in 3 months. [name] agrees to follow up with our clinic in 2 weeks.  Obesity [name] is currently in the action stage of change. As such, her goal is to continue with weight loss efforts She has agreed to keep a food journal with 1200 calories and 80+ grams of protein daily [name] has been instructed to work up to a goal of 150 minutes of combined cardio and strengthening exercise per week for weight loss and overall health benefits. We discussed the following  Behavioral Modification Strategies today: increasing lean protein intake, increasing vegetables and work on meal planning and easy cooking plans, keeping healthy foods in the home, and planning for success   [name] has agreed to follow up with our clinic in 2 weeks. She was informed of the importance of frequent follow up visits to maximize her success with intensive lifestyle modifications for her  multiple health conditions.  ALLERGIES: Allergies  Allergen Reactions  . No Known Allergies     MEDICATIONS: Current Outpatient Medications on File Prior to Visit  Medication Sig Dispense Refill  . amLODipine (NORVASC) 5 MG tablet Take 1 tablet by mouth Daily. Take 1/2 daily    . Nutritional Supplements (JUICE PLUS FIBRE PO) Take 2 tablets by mouth 3 (three) times daily.    . pravastatin (PRAVACHOL) 20 MG tablet     . Vitamin D, Ergocalciferol, (DRISDOL) 1.25 MG (50000 UT) CAPS capsule Take 1 capsule (50,000 Units total) by mouth every 7 (seven) days. 4 capsule 0   No current facility-administered medications on file prior to visit.     PAST MEDICAL HISTORY: Past Medical History:  Diagnosis Date  . Allergy   . Atrophic vaginitis   . Birt-Hogg-Dube syndrome    lung cysts   . Birt-Hogg-Dube syndrome   . Chest pain   . Colon polyps    adenomatous  . Constipation   . Depression   . Diverticulosis   . Dry skin   . Fatty liver   . Floaters   . Gall bladder disease   . Glaucoma   . Heart murmur   . Herpes simplex   . Hyperlipidemia   . Hypertension   . Joint pain   . Lichen sclerosus   . Pancreatitis   . Pancreatitis   . Pneumothorax, right    spontaneous  . Shortness of breath   . Shortness of breath on exertion   . Swelling of both lower extremities   . Trouble in sleeping     PAST SURGICAL HISTORY: Past Surgical History:  Procedure Laterality Date  . BREAST BIOPSY  03/29/2006  . BREAST BIOPSY Bilateral 06/14/1998  . BREAST EXCISIONAL BIOPSY Bilateral 1999  . BREAST LUMPECTOMY  [phone]   x5 times total , both breasts  . CHOLECYSTECTOMY    . COLONOSCOPY    . hysterctomy     abdominal  . POLYPECTOMY    . right ovary removed    . UPPER GASTROINTESTINAL ENDOSCOPY      SOCIAL HISTORY: Social History   Tobacco Use  . Smoking status: Never Smoker  . Smokeless tobacco: Never Used  Substance Use Topics  . Alcohol use: No    Alcohol/week: 0.0  standard drinks  . Drug use: No    FAMILY HISTORY: Family History  Problem Relation Age of Onset  . Diabetes Mother   . Breast cancer Mother   . CVA Mother   . Hypertension Mother   . Heart disease Mother   . Thyroid disease Mother   . Cancer Mother   . Diabetes Father   . Prostate cancer Father   . Heart disease Father   . Hypertension Father   . Hyperlipidemia Father   . Thyroid disease Father   . Cancer Father   . Obesity Father   . Liver disease Paternal Grandmother   . Breast cancer Maternal Aunt        x 3  . CVA Paternal Grandfather   . Colon cancer Neg Hx   . Esophageal  cancer Neg Hx   . Stomach cancer Neg Hx   . Rectal cancer Neg Hx     ROS: Review of Systems  Constitutional: Positive for weight loss.  Cardiovascular: Negative for chest pain and claudication.  Gastrointestinal: Negative for nausea and vomiting.  Musculoskeletal: Negative for myalgias.       Negative muscle weakness    PHYSICAL EXAM: Pt in no acute distress  RECENT LABS AND TESTS: BMET    Component Value Date/Time   NA 143 10/15/2018 0859   K 4.3 10/15/2018 0859   CL 104 10/15/2018 0859   CO2 24 10/15/2018 0859   GLUCOSE 110 (H) 10/15/2018 0859   GLUCOSE 106 (H) 07/02/2012 0510   BUN 21 10/15/2018 0859   CREATININE 1.04 (H) 10/15/2018 0859   CALCIUM 9.5 10/15/2018 0859   GFRNONAA 57 (L) 10/15/2018 0859   GFRAA 66 10/15/2018 0859   Lab Results  Component Value Date   HGBA1C 5.2 10/15/2018   Lab Results  Component Value Date   INSULIN 13.8 10/15/2018   INSULIN 16.4 07/03/2018   CBC    Component Value Date/Time   WBC 7.8 07/02/2012 0510   RBC 4.79 07/02/2012 0510   HGB 14.4 07/02/2012 0510   HCT 40.9 07/02/2012 0510   PLT 165 07/02/2012 0510   MCV 85.4 07/02/2012 0510   MCH 30.1 07/02/2012 0510   MCHC 35.2 07/02/2012 0510   RDW 12.9 07/02/2012 0510   LYMPHSABS 2.7 12/27/2008 1722   MONOABS 0.4 12/27/2008 1722   EOSABS 0.1 12/27/2008 1722   BASOSABS 0.0  12/27/2008 1722   Iron/TIBC/Ferritin/ %Sat No results found for: IRON, TIBC, FERRITIN, IRONPCTSAT Lipid Panel  No results found for: CHOL, TRIG, HDL, CHOLHDL, VLDL, LDLCALC, LDLDIRECT Hepatic Function Panel     Component Value Date/Time   PROT 6.6 10/15/2018 0859   ALBUMIN 4.5 10/15/2018 0859   AST 20 10/15/2018 0859   ALT 29 10/15/2018 0859   ALKPHOS 99 10/15/2018 0859   BILITOT 0.5 10/15/2018 0859   BILIDIR <0.1 07/02/2012 0521   IBILI NOT CALCULATED 07/02/2012 0521      Component Value Date/Time   TSH 1.470 07/03/2018 1105      I, Trixie Dredge, am acting as transcriptionist for Ilene Qua, MD   I have reviewed the above documentation for accuracy and completeness, and I agree with the above. - Ilene Qua, MD

## 2019-04-24 ENCOUNTER — Encounter (INDEPENDENT_AMBULATORY_CARE_PROVIDER_SITE_OTHER): Payer: Self-pay | Admitting: Family Medicine

## 2019-04-29 ENCOUNTER — Other Ambulatory Visit: Payer: Self-pay

## 2019-04-29 ENCOUNTER — Ambulatory Visit (INDEPENDENT_AMBULATORY_CARE_PROVIDER_SITE_OTHER): Payer: BC Managed Care – PPO | Admitting: Family Medicine

## 2019-04-29 DIAGNOSIS — E559 Vitamin D deficiency, unspecified: Secondary | ICD-10-CM

## 2019-04-29 DIAGNOSIS — Z6830 Body mass index (BMI) 30.0-30.9, adult: Secondary | ICD-10-CM

## 2019-04-29 DIAGNOSIS — E8881 Metabolic syndrome: Secondary | ICD-10-CM | POA: Diagnosis not present

## 2019-04-29 DIAGNOSIS — E669 Obesity, unspecified: Secondary | ICD-10-CM

## 2019-04-30 NOTE — Progress Notes (Signed)
Office: [phone]  /  Fax: [phone] TeleHealth Visit:  Julie Moran has verbally consented to this TeleHealth visit today. The patient is located at home, the provider is located at the [facility] office. The participants in this visit include the listed provider and patient. The visit was conducted today via face time.  HPI:   Chief Complaint: OBESITY Julie Moran is here to discuss her progress with her obesity treatment plan. She is on the keep a food journal with 1200 calories and 80+ grams of protein daily and is following her eating plan approximately 50 % of the time. She states she is walking 7,000 steps 6 times per week, and strength training for 20 minutes 3 times per week. Julie Moran has been really focused on physical activity in the past few weeks, and has consistently walked 6 times per week. She has journaled everyday, but only hits goal 50% of the time. Her weight is of 159 lbs.  We were unable to weigh the patient today for this TeleHealth visit. She feels as if she has lost 1 lb since her last visit. She has lost 23-24 lbs since starting treatment with [place].  Insulin Resistance Julie Moran has a diagnosis of insulin resistance based on her elevated fasting insulin level >5. Last labs were November 2019 and insulin level was of 15.8. Although Julie Moran's blood glucose readings are still under good control, insulin resistance puts her at greater risk of metabolic syndrome and diabetes. She notes occasional carbohydrate cravings, but she is journaling. She is not taking metformin currently and continues to work on diet and exercise to decrease risk of diabetes.  Vitamin D Deficiency Julie Moran has a diagnosis of vitamin D deficiency. She is currently taking prescription Vit D. She notes fatigue and denies nausea, vomiting or muscle weakness.  ASSESSMENT AND PLAN:  Insulin resistance  Vitamin D deficiency  Class 1 obesity with serious comorbidity and body mass index (BMI)  of 30.0 to 30.9 in adult, unspecified obesity type - Starting BMI greater then 30  PLAN:  Insulin Resistance Julie Moran will continue to work on weight loss, exercise, and decreasing simple carbohydrates in her diet to help decrease the risk of diabetes. We dicussed metformin including benefits and risks. She was informed that eating too many simple carbohydrates or too many calories at one sitting increases the likelihood of GI side effects. Sam declined metformin for now and prescription was not written today. We will repeat labs at her first in person appointment. Julie Moran agrees to follow up with our clinic in 3 weeks as directed to monitor her progress.  Vitamin D Deficiency Julie Moran was informed that low vitamin D levels contributes to fatigue and are associated with obesity, breast, and colon cancer. Julie Moran agrees to continue taking prescription Vit D @50 ,000 IU every week, no refill needed. She will follow up for routine testing of vitamin D, at least 2-3 times per year. She was informed of the risk of over-replacement of vitamin D and agrees to not increase her dose unless she discusses this with [place] first. Julie Moran agrees to follow up with our clinic in 3 weeks.  Obesity Julie Moran is currently in the action stage of change. As such, her goal is to continue with weight loss efforts She has agreed to keep a food journal with 1200 calories and 80+ grams of protein daily Julie Moran has been instructed to work up to a goal of 150 minutes of combined cardio and strengthening exercise per week for weight loss and  overall health benefits. We discussed the following Behavioral Modification Strategies today: increasing lean protein intake, increasing vegetables and work on meal planning and easy cooking plans, keeping healthy foods in the home, better snacking choices, and planning for success   Julie Moran has agreed to follow up with our clinic in 3 weeks. She was informed of the importance of frequent follow up  visits to maximize her success with intensive lifestyle modifications for her multiple health conditions.  ALLERGIES: Allergies  Allergen Reactions  . No Known Allergies     MEDICATIONS: Current Outpatient Medications on File Prior to Visit  Medication Sig Dispense Refill  . amLODipine (NORVASC) 5 MG tablet Take 1 tablet by mouth Daily. Take 1/2 daily    . Nutritional Supplements (JUICE PLUS FIBRE PO) Take 2 tablets by mouth 3 (three) times daily.    . pravastatin (PRAVACHOL) 20 MG tablet     . Vitamin D, Ergocalciferol, (DRISDOL) 1.25 MG (50000 UT) CAPS capsule Take 1 capsule (50,000 Units total) by mouth every 7 (seven) days. 4 capsule 0   No current facility-administered medications on file prior to visit.     PAST MEDICAL HISTORY: Past Medical History:  Diagnosis Date  . Allergy   . Atrophic vaginitis   . Birt-Hogg-Dube syndrome    lung cysts   . Birt-Hogg-Dube syndrome   . Chest pain   . Colon polyps    adenomatous  . Constipation   . Depression   . Diverticulosis   . Dry skin   . Fatty liver   . Floaters   . Gall bladder disease   . Glaucoma   . Heart murmur   . Herpes simplex   . Hyperlipidemia   . Hypertension   . Joint pain   . Lichen sclerosus   . Pancreatitis   . Pancreatitis   . Pneumothorax, right    spontaneous  . Shortness of breath   . Shortness of breath on exertion   . Swelling of both lower extremities   . Trouble in sleeping     PAST SURGICAL HISTORY: Past Surgical History:  Procedure Laterality Date  . BREAST BIOPSY  03/29/2006  . BREAST BIOPSY Bilateral 06/14/1998  . BREAST EXCISIONAL BIOPSY Bilateral 1999  . BREAST LUMPECTOMY  [phone]   x5 times total , both breasts  . CHOLECYSTECTOMY    . COLONOSCOPY    . hysterctomy     abdominal  . POLYPECTOMY    . right ovary removed    . UPPER GASTROINTESTINAL ENDOSCOPY      SOCIAL HISTORY: Social History   Tobacco Use  . Smoking status: Never Smoker  . Smokeless tobacco:  Never Used  Substance Use Topics  . Alcohol use: No    Alcohol/week: 0.0 standard drinks  . Drug use: No    FAMILY HISTORY: Family History  Problem Relation Age of Onset  . Diabetes Mother   . Breast cancer Mother   . CVA Mother   . Hypertension Mother   . Heart disease Mother   . Thyroid disease Mother   . Cancer Mother   . Diabetes Father   . Prostate cancer Father   . Heart disease Father   . Hypertension Father   . Hyperlipidemia Father   . Thyroid disease Father   . Cancer Father   . Obesity Father   . Liver disease Paternal Grandmother   . Breast cancer Maternal Aunt        x 3  . CVA Paternal Grandfather   .  Colon cancer Neg Hx   . Esophageal cancer Neg Hx   . Stomach cancer Neg Hx   . Rectal cancer Neg Hx     ROS: Review of Systems  Constitutional: Positive for malaise/fatigue and weight loss.  Gastrointestinal: Negative for nausea and vomiting.  Musculoskeletal:       Negative muscle weakness    PHYSICAL EXAM: Pt in no acute distress  RECENT LABS AND TESTS: BMET    Component Value Date/Time   NA 143 10/15/2018 0859   K 4.3 10/15/2018 0859   CL 104 10/15/2018 0859   CO2 24 10/15/2018 0859   GLUCOSE 110 (H) 10/15/2018 0859   GLUCOSE 106 (H) 07/02/2012 0510   BUN 21 10/15/2018 0859   CREATININE 1.04 (H) 10/15/2018 0859   CALCIUM 9.5 10/15/2018 0859   GFRNONAA 57 (L) 10/15/2018 0859   GFRAA 66 10/15/2018 0859   Lab Results  Component Value Date   HGBA1C 5.2 10/15/2018   Lab Results  Component Value Date   INSULIN 13.8 10/15/2018   INSULIN 16.4 07/03/2018   CBC    Component Value Date/Time   WBC 7.8 07/02/2012 0510   RBC 4.79 07/02/2012 0510   HGB 14.4 07/02/2012 0510   HCT 40.9 07/02/2012 0510   PLT 165 07/02/2012 0510   MCV 85.4 07/02/2012 0510   MCH 30.1 07/02/2012 0510   MCHC 35.2 07/02/2012 0510   RDW 12.9 07/02/2012 0510   LYMPHSABS 2.7 12/27/2008 1722   MONOABS 0.4 12/27/2008 1722   EOSABS 0.1 12/27/2008 1722   BASOSABS  0.0 12/27/2008 1722   Iron/TIBC/Ferritin/ %Sat No results found for: IRON, TIBC, FERRITIN, IRONPCTSAT Lipid Panel  No results found for: CHOL, TRIG, HDL, CHOLHDL, VLDL, LDLCALC, LDLDIRECT Hepatic Function Panel     Component Value Date/Time   PROT 6.6 10/15/2018 0859   ALBUMIN 4.5 10/15/2018 0859   AST 20 10/15/2018 0859   ALT 29 10/15/2018 0859   ALKPHOS 99 10/15/2018 0859   BILITOT 0.5 10/15/2018 0859   BILIDIR <0.1 07/02/2012 0521   IBILI NOT CALCULATED 07/02/2012 0521      Component Value Date/Time   TSH 1.470 07/03/2018 1105      I, Trixie Dredge, am acting as transcriptionist for Ilene Qua, MD  I have reviewed the above documentation for accuracy and completeness, and I agree with the above. - Ilene Qua, MD

## 2019-05-14 ENCOUNTER — Ambulatory Visit
Admission: RE | Admit: 2019-05-14 | Discharge: 2019-05-14 | Disposition: A | Discharge status: Home / Self Care | Payer: BC Managed Care – PPO | Source: Ambulatory Visit | Attending: Internal Medicine | Admitting: Internal Medicine

## 2019-05-14 ENCOUNTER — Other Ambulatory Visit: Payer: Self-pay | Admitting: Internal Medicine

## 2019-05-14 ENCOUNTER — Other Ambulatory Visit: Payer: Self-pay

## 2019-05-14 DIAGNOSIS — Z1231 Encounter for screening mammogram for malignant neoplasm of breast: Secondary | ICD-10-CM

## 2019-05-14 DIAGNOSIS — N63 Unspecified lump in unspecified breast: Secondary | ICD-10-CM

## 2019-05-20 ENCOUNTER — Other Ambulatory Visit: Payer: Self-pay

## 2019-05-20 ENCOUNTER — Encounter (INDEPENDENT_AMBULATORY_CARE_PROVIDER_SITE_OTHER): Payer: Self-pay | Admitting: Family Medicine

## 2019-05-20 ENCOUNTER — Ambulatory Visit (INDEPENDENT_AMBULATORY_CARE_PROVIDER_SITE_OTHER): Payer: BC Managed Care – PPO | Admitting: Family Medicine

## 2019-05-20 VITALS — BP 112/65 | HR 69 | Temp 98.0°F | Ht 64.0 in | Wt 156.0 lb

## 2019-05-20 DIAGNOSIS — E669 Obesity, unspecified: Secondary | ICD-10-CM | POA: Diagnosis not present

## 2019-05-20 DIAGNOSIS — I10 Essential (primary) hypertension: Secondary | ICD-10-CM

## 2019-05-20 DIAGNOSIS — Z6830 Body mass index (BMI) 30.0-30.9, adult: Secondary | ICD-10-CM

## 2019-05-20 DIAGNOSIS — E559 Vitamin D deficiency, unspecified: Secondary | ICD-10-CM

## 2019-05-20 DIAGNOSIS — E8881 Metabolic syndrome: Secondary | ICD-10-CM | POA: Diagnosis not present

## 2019-05-22 ENCOUNTER — Ambulatory Visit
Admission: RE | Admit: 2019-05-22 | Discharge: 2019-05-22 | Disposition: A | Discharge status: Home / Self Care | Payer: BC Managed Care – PPO | Source: Ambulatory Visit | Attending: Internal Medicine | Admitting: Internal Medicine

## 2019-05-22 ENCOUNTER — Other Ambulatory Visit: Payer: Self-pay

## 2019-05-22 DIAGNOSIS — N63 Unspecified lump in unspecified breast: Secondary | ICD-10-CM

## 2019-05-23 NOTE — Progress Notes (Signed)
Office: [phone]  /  Fax: [phone]   HPI:   Chief Complaint: OBESITY Julie Moran is here to discuss her progress with her obesity treatment plan. She is on the keep a food journal with 1200 calories and 80+ grams of protein daily and is following her eating plan approximately 75 % of the time. She states she is walking for 45-60 minutes 4 times per week. Julie Moran voices she is getting tired of eating some of the food she has been eating, so she is skipping meals. She hasn't really tracked most of this past week. She is planning to go to the beach next week.  Her weight is 156 lb (70.8 kg) today and has had a weight loss of 7 pounds over a period of 3 weeks since her last visit. She has lost 27 lbs since starting treatment with [place].  Hypertension Julie Moran is a 63 y.o. female with hypertension. Julie Moran's blood pressure is well controlled. She is on amlodipine 2.5 mg PO daily and denies chest pain. She is working on weight loss to help control her blood pressure with the goal of decreasing her risk of heart attack and stroke.   Vitamin D Deficiency Julie Moran has a diagnosis of vitamin D deficiency. Last Vit D level was within normal limits on prescription Vit D 50,000 IU daily. She notes fatigue and denies nausea, vomiting or muscle weakness.  Insulin Resistance [name] has a diagnosis of insulin resistance based on her elevated fasting insulin level >5. Last insulin was of 13.8 and Hgb A1c of 5.2. Although Julie Moran's blood glucose readings are still under good control, insulin resistance puts her at greater risk of metabolic syndrome and diabetes. She is not taking metformin currently and continues to work on diet and exercise to decrease risk of diabetes.  ASSESSMENT AND PLAN:  Essential hypertension  Vitamin D deficiency - Plan: VITAMIN D 25 Hydroxy (Vit-D Deficiency, Fractures)  Insulin resistance  Class 1 obesity with serious comorbidity and body mass index (BMI) of 30.0 to  30.9 in adult, unspecified obesity type - Starting BMI greater then 30  PLAN:  Hypertension We discussed sodium restriction, working on healthy weight loss, and a regular exercise program as the means to achieve improved blood pressure control. Julie Moran agreed with this plan and agreed to follow up as directed. We will continue to monitor her blood pressure as well as her progress with the above lifestyle modifications. Julie Moran is to stop amlodipine and we will recheck blood pressure at her next appointment. She will watch for signs of hypotension as she continues her lifestyle modifications. Julie Moran agrees to follow up with our clinic in 3 weeks.  Vitamin D Deficiency Raschelle was informed that low vitamin D levels contributes to fatigue and are associated with obesity, breast, and colon cancer. Julie Moran agrees to continue taking prescription Vit D @50 ,000 IU every week and will follow up for routine testing of vitamin D, at least 2-3 times per year. She was informed of the risk of over-replacement of vitamin D and agrees to not increase her dose unless she discusses this with [place] first. We will recheck labs in 3 months. Julie Moran agrees to follow up with our clinic in 3 weeks.  Insulin Resistance Julie Moran will continue to work on weight loss, exercise, and decreasing simple carbohydrates in her diet to help decrease the risk of diabetes. We dicussed metformin including benefits and risks. She was informed that eating too many simple carbohydrates or too many calories at one sitting increases  the likelihood of GI side effects. Julie Moran declined metformin for now and prescription was not written today. Last labs were done last month. [name] agrees to follow up with our clinic in 3 weeks as directed to monitor her progress.  Obesity Julie Moran is currently in the action stage of change. As such, her goal is to continue with weight loss efforts She has agreed to keep a food journal with 1200 calories and 80+ grams of  protein daily [name] has been instructed to work up to a goal of 150 minutes of combined cardio and strengthening exercise per week for weight loss and overall health benefits. We discussed the following Behavioral Modification Strategies today: increasing lean protein intake, increasing vegetables and work on meal planning and easy cooking plans, keeping healthy foods in the home, and planning for success   Julie Moran has agreed to follow up with our clinic in 3 weeks. She was informed of the importance of frequent follow up visits to maximize her success with intensive lifestyle modifications for her multiple health conditions.  ALLERGIES: Allergies  Allergen Reactions  . No Known Allergies     MEDICATIONS: Current Outpatient Medications on File Prior to Visit  Medication Sig Dispense Refill  . amLODipine (NORVASC) 5 MG tablet Take 1 tablet by mouth Daily. Take 1/2 daily    . Nutritional Supplements (JUICE PLUS FIBRE PO) Take 2 tablets by mouth 3 (three) times daily.    . pravastatin (PRAVACHOL) 20 MG tablet     . Vitamin D, Ergocalciferol, (DRISDOL) 1.25 MG (50000 UT) CAPS capsule Take 1 capsule (50,000 Units total) by mouth every 7 (seven) days. 4 capsule 0   No current facility-administered medications on file prior to visit.     PAST MEDICAL HISTORY: Past Medical History:  Diagnosis Date  . Allergy   . Atrophic vaginitis   . Birt-Hogg-Dube syndrome    lung cysts   . Birt-Hogg-Dube syndrome   . Chest pain   . Colon polyps    adenomatous  . Constipation   . Depression   . Diverticulosis   . Dry skin   . Fatty liver   . Floaters   . Gall bladder disease   . Glaucoma   . Heart murmur   . Herpes simplex   . Hyperlipidemia   . Hypertension   . Joint pain   . Lichen sclerosus   . Pancreatitis   . Pancreatitis   . Pneumothorax, right    spontaneous  . Shortness of breath   . Shortness of breath on exertion   . Swelling of both lower extremities   . Trouble in  sleeping     PAST SURGICAL HISTORY: Past Surgical History:  Procedure Laterality Date  . BREAST BIOPSY  03/29/2006  . BREAST BIOPSY Bilateral 06/14/1998  . BREAST EXCISIONAL BIOPSY Bilateral 1999  . BREAST LUMPECTOMY  [phone]   x5 times total , both breasts  . CHOLECYSTECTOMY    . COLONOSCOPY    . hysterctomy     abdominal  . POLYPECTOMY    . right ovary removed    . UPPER GASTROINTESTINAL ENDOSCOPY      SOCIAL HISTORY: Social History   Tobacco Use  . Smoking status: Never Smoker  . Smokeless tobacco: Never Used  Substance Use Topics  . Alcohol use: No    Alcohol/week: 0.0 standard drinks  . Drug use: No    FAMILY HISTORY: Family History  Problem Relation Age of Onset  . Diabetes Mother   . Breast  cancer Mother   . CVA Mother   . Hypertension Mother   . Heart disease Mother   . Thyroid disease Mother   . Cancer Mother   . Diabetes Father   . Prostate cancer Father   . Heart disease Father   . Hypertension Father   . Hyperlipidemia Father   . Thyroid disease Father   . Cancer Father   . Obesity Father   . Liver disease Paternal Grandmother   . Breast cancer Maternal Aunt        x 3  . CVA Paternal Grandfather   . Colon cancer Neg Hx   . Esophageal cancer Neg Hx   . Stomach cancer Neg Hx   . Rectal cancer Neg Hx     ROS: Review of Systems  Constitutional: Positive for malaise/fatigue and weight loss.  Cardiovascular: Negative for chest pain.  Gastrointestinal: Negative for nausea and vomiting.  Musculoskeletal:       Negative muscle weakness    PHYSICAL EXAM: Blood pressure 112/65, pulse 69, temperature 98 F (36.7 C), temperature source Oral, height 5\' 4"  (1.626 m), weight 156 lb (70.8 kg), SpO2 97 %. Body mass index is 26.78 kg/m. Physical Exam Vitals signs reviewed.  Constitutional:      Appearance: Normal appearance. She is obese.  Cardiovascular:     Rate and Rhythm: Normal rate.     Pulses: Normal pulses.  Pulmonary:      Effort: Pulmonary effort is normal.     Breath sounds: Normal breath sounds.  Musculoskeletal: Normal range of motion.  Skin:    General: Skin is warm and dry.  Neurological:     Mental Status: She is alert and oriented to person, place, and time.  Psychiatric:        Mood and Affect: Mood normal.        Behavior: Behavior normal.     RECENT LABS AND TESTS: BMET    Component Value Date/Time   NA 143 10/15/2018 0859   K 4.3 10/15/2018 0859   CL 104 10/15/2018 0859   CO2 24 10/15/2018 0859   GLUCOSE 110 (H) 10/15/2018 0859   GLUCOSE 106 (H) 07/02/2012 0510   BUN 21 10/15/2018 0859   CREATININE 1.04 (H) 10/15/2018 0859   CALCIUM 9.5 10/15/2018 0859   GFRNONAA 57 (L) 10/15/2018 0859   GFRAA 66 10/15/2018 0859   Lab Results  Component Value Date   HGBA1C 5.2 10/15/2018   Lab Results  Component Value Date   INSULIN 13.8 10/15/2018   INSULIN 16.4 07/03/2018   CBC    Component Value Date/Time   WBC 7.8 07/02/2012 0510   RBC 4.79 07/02/2012 0510   HGB 14.4 07/02/2012 0510   HCT 40.9 07/02/2012 0510   PLT 165 07/02/2012 0510   MCV 85.4 07/02/2012 0510   MCH 30.1 07/02/2012 0510   MCHC 35.2 07/02/2012 0510   RDW 12.9 07/02/2012 0510   LYMPHSABS 2.7 12/27/2008 1722   MONOABS 0.4 12/27/2008 1722   EOSABS 0.1 12/27/2008 1722   BASOSABS 0.0 12/27/2008 1722   Iron/TIBC/Ferritin/ %Sat No results found for: IRON, TIBC, FERRITIN, IRONPCTSAT Lipid Panel  No results found for: CHOL, TRIG, HDL, CHOLHDL, VLDL, LDLCALC, LDLDIRECT Hepatic Function Panel     Component Value Date/Time   PROT 6.6 10/15/2018 0859   ALBUMIN 4.5 10/15/2018 0859   AST 20 10/15/2018 0859   ALT 29 10/15/2018 0859   ALKPHOS 99 10/15/2018 0859   BILITOT 0.5 10/15/2018 0859   BILIDIR <0.1  07/02/2012 0521   IBILI NOT CALCULATED 07/02/2012 0521      Component Value Date/Time   TSH 1.470 07/03/2018 1105      OBESITY BEHAVIORAL INTERVENTION VISIT  Today's visit was # 20   Starting weight: 183  lbs Starting date: 07/03/18 Today's weight : 156 lbs  Today's date: 05/20/2019 Total lbs lost to date: 84    ASK: We discussed the diagnosis of obesity with Cristy Hilts [place] today and Ricquel agreed to give [place] permission to discuss obesity behavioral modification therapy today.  ASSESS: Anaisabel has the diagnosis of obesity and her BMI today is 26.76 Taylia is in the action stage of change   ADVISE: Forest was educated on the multiple health risks of obesity as well as the benefit of weight loss to improve her health. She was advised of the need for long term treatment and the importance of lifestyle modifications to improve her current health and to decrease her risk of future health problems.  AGREE: Multiple dietary modification options and treatment options were discussed and  Braleigh agreed to follow the recommendations documented in the above note.  ARRANGE: Jozee was educated on the importance of frequent visits to treat obesity as outlined per CMS and USPSTF guidelines and agreed to schedule her next follow up appointment today.  I, Trixie Dredge, am acting as transcriptionist for Ilene Qua, MD  I have reviewed the above documentation for accuracy and completeness, and I agree with the above. - Ilene Qua, MD

## 2019-06-10 ENCOUNTER — Encounter (INDEPENDENT_AMBULATORY_CARE_PROVIDER_SITE_OTHER): Payer: Self-pay | Admitting: Family Medicine

## 2019-06-10 ENCOUNTER — Other Ambulatory Visit: Payer: Self-pay

## 2019-06-10 ENCOUNTER — Ambulatory Visit (INDEPENDENT_AMBULATORY_CARE_PROVIDER_SITE_OTHER): Payer: Medicare Other | Admitting: Family Medicine

## 2019-06-10 VITALS — BP 103/73 | HR 66 | Temp 97.8°F | Ht 64.0 in | Wt 152.0 lb

## 2019-06-10 DIAGNOSIS — E559 Vitamin D deficiency, unspecified: Secondary | ICD-10-CM | POA: Diagnosis not present

## 2019-06-10 DIAGNOSIS — E663 Overweight: Secondary | ICD-10-CM

## 2019-06-10 DIAGNOSIS — E7849 Other hyperlipidemia: Secondary | ICD-10-CM

## 2019-06-11 NOTE — Progress Notes (Signed)
Office: [phone]  /  Fax: [phone]   HPI:   Chief Complaint: OBESITY Julie Moran is here to discuss her progress with her obesity treatment plan. She is on the keep a food journal with 1200 calories and 80+ grams of protein daily and is following her eating plan approximately 50 % of the time. She states she is walking for 30-40 minutes 5 times per week. Julie Moran hasn't really tracked much the past 2 weeks, just tried to be mindful of what she was eating. She has not gone on vacation. Her [birth year] is on [birth year] (going to eat at [facility]).  Her weight is 152 lb (68.9 kg) today and has had a weight loss of 4 pounds over a period of 3 weeks since her last visit. She has lost 31 lbs since starting treatment with [place].  Vitamin D Deficiency Julie Moran has a diagnosis of vitamin D deficiency. She is currently taking prescription Vit D. She notes fatigue and denies nausea, vomiting or muscle weakness.  Hyperlipidemia Julie Moran has hyperlipidemia and has been trying to improve her cholesterol levels with intensive lifestyle modification including a low saturated fat diet, exercise and weight loss. Last LDL was of 90 on labs. She denies any chest pain, claudication or myalgias.  ASSESSMENT AND PLAN:  Vitamin D deficiency  Other hyperlipidemia  Overweight (BMI 25.0-29.9)  PLAN:  Vitamin D Deficiency Julie Moran was informed that low vitamin D levels contributes to fatigue and are associated with obesity, breast, and colon cancer. Julie Moran agrees to continue taking OTC Vit D and will follow up for routine testing of vitamin D, at least 2-3 times per year. She was informed of the risk of over-replacement of vitamin D and agrees to not increase her dose unless she discusses this with [place] first. Julie Moran agrees to follow up with our clinic in 2 weeks.  Hyperlipidemia Julie Moran was informed of the American Heart Association Guidelines emphasizing intensive lifestyle modifications as the first line treatment  for hyperlipidemia. We discussed many lifestyle modifications today in depth, and Julie Moran will continue to work on decreasing saturated fats such as fatty red meat, butter and many fried foods. She will also increase vegetables and lean protein in her diet and continue to work on exercise and weight loss efforts. Julie Moran agrees to continue taking Lipitor, and she agrees to follow up with our clinic in 2 weeks.  Obesity Julie Moran is currently in the action stage of change. As such, her goal is to continue with weight loss efforts She has agreed to keep a food journal with 1200 calories and 80+ grams of protein daily Julie Moran has been instructed to work up to a goal of 150 minutes of combined cardio and strengthening exercise per week for weight loss and overall health benefits. We discussed the following Behavioral Modification Strategies today: increasing lean protein intake, increasing vegetables and work on meal planning and easy cooking plans, keeping healthy foods in the home, better snacking choices, and planning for success   Julie Moran has agreed to follow up with our clinic in 2 weeks. She was informed of the importance of frequent follow up visits to maximize her success with intensive lifestyle modifications for her multiple health conditions.  ALLERGIES: Allergies  Allergen Reactions  . No Known Allergies     MEDICATIONS: Current Outpatient Medications on File Prior to Visit  Medication Sig Dispense Refill  . cholecalciferol (VITAMIN D3) 25 MCG (1000 UT) tablet Take 1,000 Units by mouth daily.    . Nutritional Supplements (  JUICE PLUS FIBRE PO) Take 2 tablets by mouth 3 (three) times daily.    . pravastatin (PRAVACHOL) 20 MG tablet     . vitamin C (ASCORBIC ACID) 250 MG tablet Take 250 mg by mouth daily.    Marland Kitchen amLODipine (NORVASC) 5 MG tablet Take 1 tablet by mouth Daily. Take 1/2 daily    . Vitamin D, Ergocalciferol, (DRISDOL) 1.25 MG (50000 UT) CAPS capsule Take 1 capsule (50,000 Units  total) by mouth every 7 (seven) days. (Patient not taking: Reported on 06/10/2019) 4 capsule 0   No current facility-administered medications on file prior to visit.     PAST MEDICAL HISTORY: Past Medical History:  Diagnosis Date  . Allergy   . Atrophic vaginitis   . Birt-Hogg-Dube syndrome    lung cysts   . Birt-Hogg-Dube syndrome   . Chest pain   . Colon polyps    adenomatous  . Constipation   . Depression   . Diverticulosis   . Dry skin   . Fatty liver   . Floaters   . Gall bladder disease   . Glaucoma   . Heart murmur   . Herpes simplex   . Hyperlipidemia   . Hypertension   . Joint pain   . Lichen sclerosus   . Pancreatitis   . Pancreatitis   . Pneumothorax, right    spontaneous  . Shortness of breath   . Shortness of breath on exertion   . Swelling of both lower extremities   . Trouble in sleeping     PAST SURGICAL HISTORY: Past Surgical History:  Procedure Laterality Date  . BREAST BIOPSY  03/29/2006  . BREAST BIOPSY Bilateral 06/14/1998  . BREAST EXCISIONAL BIOPSY Bilateral 1999  . BREAST LUMPECTOMY  [phone]   x5 times total , both breasts  . CHOLECYSTECTOMY    . COLONOSCOPY    . hysterctomy     abdominal  . POLYPECTOMY    . right ovary removed    . UPPER GASTROINTESTINAL ENDOSCOPY      SOCIAL HISTORY: Social History   Tobacco Use  . Smoking status: Never Smoker  . Smokeless tobacco: Never Used  Substance Use Topics  . Alcohol use: No    Alcohol/week: 0.0 standard drinks  . Drug use: No    FAMILY HISTORY: Family History  Problem Relation Age of Onset  . Diabetes Mother   . Breast cancer Mother   . CVA Mother   . Hypertension Mother   . Heart disease Mother   . Thyroid disease Mother   . Cancer Mother   . Diabetes Father   . Prostate cancer Father   . Heart disease Father   . Hypertension Father   . Hyperlipidemia Father   . Thyroid disease Father   . Cancer Father   . Obesity Father   . Liver disease Paternal  Grandmother   . Breast cancer Maternal Aunt        x 3  . CVA Paternal Grandfather   . Colon cancer Neg Hx   . Esophageal cancer Neg Hx   . Stomach cancer Neg Hx   . Rectal cancer Neg Hx     ROS: Review of Systems  Constitutional: Positive for malaise/fatigue and weight loss.  Cardiovascular: Negative for chest pain and claudication.  Gastrointestinal: Negative for nausea and vomiting.  Musculoskeletal: Negative for myalgias.       Negative muscle weakness    PHYSICAL EXAM: Blood pressure 103/73, pulse 66, temperature 97.8 F (36.6 C),  temperature source Oral, height 5\' 4"  (1.626 m), weight 152 lb (68.9 kg), SpO2 95 %. Body mass index is 26.09 kg/m. Physical Exam Vitals signs reviewed.  Constitutional:      Appearance: Normal appearance. She is obese.  Cardiovascular:     Rate and Rhythm: Normal rate.     Pulses: Normal pulses.  Pulmonary:     Effort: Pulmonary effort is normal.     Breath sounds: Normal breath sounds.  Musculoskeletal: Normal range of motion.  Skin:    General: Skin is warm and dry.  Neurological:     Mental Status: She is alert and oriented to person, place, and time.  Psychiatric:        Mood and Affect: Mood normal.        Behavior: Behavior normal.     RECENT LABS AND TESTS: BMET    Component Value Date/Time   NA 143 10/15/2018 0859   K 4.3 10/15/2018 0859   CL 104 10/15/2018 0859   CO2 24 10/15/2018 0859   GLUCOSE 110 (H) 10/15/2018 0859   GLUCOSE 106 (H) 07/02/2012 0510   BUN 21 10/15/2018 0859   CREATININE 1.04 (H) 10/15/2018 0859   CALCIUM 9.5 10/15/2018 0859   GFRNONAA 57 (L) 10/15/2018 0859   GFRAA 66 10/15/2018 0859   Lab Results  Component Value Date   HGBA1C 5.2 10/15/2018   Lab Results  Component Value Date   INSULIN 13.8 10/15/2018   INSULIN 16.4 07/03/2018   CBC    Component Value Date/Time   WBC 7.8 07/02/2012 0510   RBC 4.79 07/02/2012 0510   HGB 14.4 07/02/2012 0510   HCT 40.9 07/02/2012 0510   PLT 165  07/02/2012 0510   MCV 85.4 07/02/2012 0510   MCH 30.1 07/02/2012 0510   MCHC 35.2 07/02/2012 0510   RDW 12.9 07/02/2012 0510   LYMPHSABS 2.7 12/27/2008 1722   MONOABS 0.4 12/27/2008 1722   EOSABS 0.1 12/27/2008 1722   BASOSABS 0.0 12/27/2008 1722   Iron/TIBC/Ferritin/ %Sat No results found for: IRON, TIBC, FERRITIN, IRONPCTSAT Lipid Panel  No results found for: CHOL, TRIG, HDL, CHOLHDL, VLDL, LDLCALC, LDLDIRECT Hepatic Function Panel     Component Value Date/Time   PROT 6.6 10/15/2018 0859   ALBUMIN 4.5 10/15/2018 0859   AST 20 10/15/2018 0859   ALT 29 10/15/2018 0859   ALKPHOS 99 10/15/2018 0859   BILITOT 0.5 10/15/2018 0859   BILIDIR <0.1 07/02/2012 0521   IBILI NOT CALCULATED 07/02/2012 0521      Component Value Date/Time   TSH 1.470 07/03/2018 1105      OBESITY BEHAVIORAL INTERVENTION VISIT  Today's visit was # 21   Starting weight: 183 lbs Starting date: 07/03/18 Today's weight : 152 lbs Today's date: 06/10/2019 Total lbs lost to date: 22    ASK: We discussed the diagnosis of obesity with Julie Moran today and Julie Moran agreed to give [place] permission to discuss obesity behavioral modification therapy today.  ASSESS: Jackelin has the diagnosis of obesity and her BMI today is 26.08 Julie Moran is in the action stage of change   ADVISE: Julie Moran was educated on the multiple health risks of obesity as well as the benefit of weight loss to improve her health. She was advised of the need for long term treatment and the importance of lifestyle modifications to improve her current health and to decrease her risk of future health problems.  AGREE: Multiple dietary modification options and treatment options were discussed and  Julie Moran agreed to  follow the recommendations documented in the above note.  ARRANGE: Julie Moran was educated on the importance of frequent visits to treat obesity as outlined per CMS and USPSTF guidelines and agreed to schedule her next follow up  appointment today.  I, Trixie Dredge, am acting as transcriptionist for Julie Qua, MD  I have reviewed the above documentation for accuracy and completeness, and I agree with the above. - Julie Qua, MD

## 2019-06-22 ENCOUNTER — Other Ambulatory Visit (INDEPENDENT_AMBULATORY_CARE_PROVIDER_SITE_OTHER): Payer: Self-pay

## 2019-06-22 DIAGNOSIS — E559 Vitamin D deficiency, unspecified: Secondary | ICD-10-CM

## 2019-06-22 DIAGNOSIS — I10 Essential (primary) hypertension: Secondary | ICD-10-CM

## 2019-06-23 LAB — COMPREHENSIVE METABOLIC PANEL
ALT: 22 IU/L (ref 0–32)
AST: 18 IU/L (ref 0–40)
Albumin/Globulin Ratio: 1.7 (ref 1.2–2.2)
Albumin: 4.3 g/dL (ref 3.8–4.8)
Alkaline Phosphatase: 91 IU/L (ref 39–117)
BUN/Creatinine Ratio: 16 (ref 12–28)
BUN: 17 mg/dL (ref 8–27)
Bilirubin Total: 0.6 mg/dL (ref 0.0–1.2)
CO2: 24 mmol/L (ref 20–29)
Calcium: 9.4 mg/dL (ref 8.7–10.3)
Chloride: 105 mmol/L (ref 96–106)
Creatinine, Ser: 1.04 mg/dL — ABNORMAL HIGH (ref 0.57–1.00)
GFR calc Af Amer: 65 mL/min/{1.73_m2} (ref >59)
GFR calc non Af Amer: 57 mL/min/{1.73_m2} — ABNORMAL LOW (ref >59)
Globulin, Total: 2.5 g/dL (ref 1.5–4.5)
Glucose: 96 mg/dL (ref 65–99)
Potassium: 4.1 mmol/L (ref 3.5–5.2)
Sodium: 143 mmol/L (ref 134–144)
Total Protein: 6.8 g/dL (ref 6.0–8.5)

## 2019-06-23 LAB — VITAMIN D 25 HYDROXY (VIT D DEFICIENCY, FRACTURES): Vit D, 25-Hydroxy: 41.7 ng/mL (ref 30.0–100.0)

## 2019-07-06 ENCOUNTER — Encounter (INDEPENDENT_AMBULATORY_CARE_PROVIDER_SITE_OTHER): Payer: Self-pay | Admitting: Family Medicine

## 2019-07-07 ENCOUNTER — Telehealth (INDEPENDENT_AMBULATORY_CARE_PROVIDER_SITE_OTHER): Payer: Medicare Other | Admitting: Family Medicine

## 2019-07-07 ENCOUNTER — Encounter (INDEPENDENT_AMBULATORY_CARE_PROVIDER_SITE_OTHER): Payer: Self-pay | Admitting: Family Medicine

## 2019-07-07 ENCOUNTER — Other Ambulatory Visit: Payer: Self-pay

## 2019-07-07 DIAGNOSIS — Z6830 Body mass index (BMI) 30.0-30.9, adult: Secondary | ICD-10-CM

## 2019-07-07 DIAGNOSIS — E8881 Metabolic syndrome: Secondary | ICD-10-CM | POA: Diagnosis not present

## 2019-07-07 DIAGNOSIS — E669 Obesity, unspecified: Secondary | ICD-10-CM | POA: Diagnosis not present

## 2019-07-07 DIAGNOSIS — E559 Vitamin D deficiency, unspecified: Secondary | ICD-10-CM | POA: Diagnosis not present

## 2019-07-07 NOTE — Progress Notes (Signed)
Office: [phone]  /  Fax: [phone] TeleHealth Visit:  Julie Moran has verbally consented to this TeleHealth visit today. The patient is located at home, the provider is located at the [facility] office. The participants in this visit include the listed provider and patient. The visit was conducted today via face time.  HPI:   Chief Complaint: OBESITY [name] is here to discuss her progress with her obesity treatment plan. She is on the keep a food journal with 1200 calories and 80+ grams of protein daily and is following her eating plan approximately 0 % of the time. She states she is exercising 0 minutes 0 times per week. Julie Moran's weight is of 157 lbs today. She has not followed her calories and protein for the past few weeks. She has not walked much either. She has plans on August 20th, to go to the [place] with friends.  We were unable to weigh the patient today for this TeleHealth visit. She feels as if she has maintained her weight since her last visit. She has lost 31 lbs since starting treatment with [place].  Vitamin D Deficiency Julie Moran has a diagnosis of vitamin D deficiency. She is currently taking prescription Vit D. She notes fatigue and denies nausea, vomiting or muscle weakness.  Insulin Resistance Tajae has a diagnosis of insulin resistance based on her elevated fasting insulin level >5. Although Julie Moran's blood glucose readings are still under good control, insulin resistance puts her at greater risk of metabolic syndrome and diabetes. She is not taking metformin currently and continues to work on diet and exercise to decrease risk of diabetes.  ASSESSMENT AND PLAN:  Vitamin D deficiency  Insulin resistance  Class 1 obesity with serious comorbidity and body mass index (BMI) of 30.0 to 30.9 in adult, unspecified obesity type  PLAN:  Vitamin D Deficiency Julie Moran was informed that low vitamin D levels contributes to fatigue and are associated  with obesity, breast, and colon cancer. Julie Moran agrees to continue taking prescription Vit D 50,000 IU every week, no refills needed. She is to take OTC Vit D 2,000 IU daily and 1,000 IU in multivitamin. She will follow up for routine testing of vitamin D, at least 2-3 times per year. She was informed of the risk of over-replacement of vitamin D and agrees to not increase her dose unless she discusses this with [place] first. Julie Moran agrees to follow up with our clinic in 3 weeks.  Insulin Resistance Julie Moran will continue to work on weight loss, exercise, and decreasing simple carbohydrates in her diet to help decrease the risk of diabetes. We dicussed metformin including benefits and risks. She was informed that eating too many simple carbohydrates or too many calories at one sitting increases the likelihood of GI side effects. Julie Moran declined metformin for now and prescription was not written today. We will consider repeat of labs in 2 months. Julie Moran agrees to follow up with our clinic in 3 weeks as directed to monitor her progress.  Obesity Julie Moran is currently in the action stage of change. As such, her goal is to continue with weight loss efforts She has agreed to keep a food journal with 1200 calories and 80+ grams of protein daily Julie Moran has been instructed to work up to a goal of 150 minutes of combined cardio and strengthening exercise per week for weight loss and overall health benefits. We discussed the following Behavioral Modification Strategies today: increasing lean protein intake, increasing vegetables and work on meal planning  and easy cooking plans, keeping healthy foods in the home, better snacking choices, and planning for success   [name] has agreed to follow up with our clinic in 3 weeks. She was informed of the importance of frequent follow up visits to maximize her success with intensive lifestyle modifications for her multiple health conditions.  ALLERGIES: Allergies  Allergen  Reactions  . No Known Allergies     MEDICATIONS: Current Outpatient Medications on File Prior to Visit  Medication Sig Dispense Refill  . amLODipine (NORVASC) 5 MG tablet Take 1 tablet by mouth Daily. Take 1/2 daily    . cholecalciferol (VITAMIN D3) 25 MCG (1000 UT) tablet Take 1,000 Units by mouth daily.    . Nutritional Supplements (JUICE PLUS FIBRE PO) Take 2 tablets by mouth 3 (three) times daily.    . pravastatin (PRAVACHOL) 20 MG tablet     . vitamin C (ASCORBIC ACID) 250 MG tablet Take 250 mg by mouth daily.    . Vitamin D, Ergocalciferol, (DRISDOL) 1.25 MG (50000 UT) CAPS capsule Take 1 capsule (50,000 Units total) by mouth every 7 (seven) days. (Patient not taking: Reported on 06/10/2019) 4 capsule 0   No current facility-administered medications on file prior to visit.     PAST MEDICAL HISTORY: Past Medical History:  Diagnosis Date  . Allergy   . Atrophic vaginitis   . Birt-Hogg-Dube syndrome    lung cysts   . Birt-Hogg-Dube syndrome   . Chest pain   . Colon polyps    adenomatous  . Constipation   . Depression   . Diverticulosis   . Dry skin   . Fatty liver   . Floaters   . Gall bladder disease   . Glaucoma   . Heart murmur   . Herpes simplex   . Hyperlipidemia   . Hypertension   . Joint pain   . Lichen sclerosus   . Pancreatitis   . Pancreatitis   . Pneumothorax, right    spontaneous  . Shortness of breath   . Shortness of breath on exertion   . Swelling of both lower extremities   . Trouble in sleeping     PAST SURGICAL HISTORY: Past Surgical History:  Procedure Laterality Date  . BREAST BIOPSY  03/29/2006  . BREAST BIOPSY Bilateral 06/14/1998  . BREAST EXCISIONAL BIOPSY Bilateral 1999  . BREAST LUMPECTOMY  [phone]   x5 times total , both breasts  . CHOLECYSTECTOMY    . COLONOSCOPY    . hysterctomy     abdominal  . POLYPECTOMY    . right ovary removed    . UPPER GASTROINTESTINAL ENDOSCOPY      SOCIAL HISTORY: Social History    Tobacco Use  . Smoking status: Never Smoker  . Smokeless tobacco: Never Used  Substance Use Topics  . Alcohol use: No    Alcohol/week: 0.0 standard drinks  . Drug use: No    FAMILY HISTORY: Family History  Problem Relation Age of Onset  . Diabetes Mother   . Breast cancer Mother   . CVA Mother   . Hypertension Mother   . Heart disease Mother   . Thyroid disease Mother   . Cancer Mother   . Diabetes Father   . Prostate cancer Father   . Heart disease Father   . Hypertension Father   . Hyperlipidemia Father   . Thyroid disease Father   . Cancer Father   . Obesity Father   . Liver disease Paternal Grandmother   .  Breast cancer Maternal Aunt        x 3  . CVA Paternal Grandfather   . Colon cancer Neg Hx   . Esophageal cancer Neg Hx   . Stomach cancer Neg Hx   . Rectal cancer Neg Hx     ROS: Review of Systems  Constitutional: Positive for malaise/fatigue. Negative for weight loss.  Gastrointestinal: Negative for nausea and vomiting.  Musculoskeletal:       Negative muscle weakness    PHYSICAL EXAM: Pt in no acute distress  RECENT LABS AND TESTS: BMET    Component Value Date/Time   NA 143 06/22/2019 1125   K 4.1 06/22/2019 1125   CL 105 06/22/2019 1125   CO2 24 06/22/2019 1125   GLUCOSE 96 06/22/2019 1125   GLUCOSE 106 (H) 07/02/2012 0510   BUN 17 06/22/2019 1125   CREATININE 1.04 (H) 06/22/2019 1125   CALCIUM 9.4 06/22/2019 1125   GFRNONAA 57 (L) 06/22/2019 1125   GFRAA 65 06/22/2019 1125   Lab Results  Component Value Date   HGBA1C 5.2 10/15/2018   Lab Results  Component Value Date   INSULIN 13.8 10/15/2018   INSULIN 16.4 07/03/2018   CBC    Component Value Date/Time   WBC 7.8 07/02/2012 0510   RBC 4.79 07/02/2012 0510   HGB 14.4 07/02/2012 0510   HCT 40.9 07/02/2012 0510   PLT 165 07/02/2012 0510   MCV 85.4 07/02/2012 0510   MCH 30.1 07/02/2012 0510   MCHC 35.2 07/02/2012 0510   RDW 12.9 07/02/2012 0510   LYMPHSABS 2.7 12/27/2008 1722    MONOABS 0.4 12/27/2008 1722   EOSABS 0.1 12/27/2008 1722   BASOSABS 0.0 12/27/2008 1722   Iron/TIBC/Ferritin/ %Sat No results found for: IRON, TIBC, FERRITIN, IRONPCTSAT Lipid Panel  No results found for: CHOL, TRIG, HDL, CHOLHDL, VLDL, LDLCALC, LDLDIRECT Hepatic Function Panel     Component Value Date/Time   PROT 6.8 06/22/2019 1125   ALBUMIN 4.3 06/22/2019 1125   AST 18 06/22/2019 1125   ALT 22 06/22/2019 1125   ALKPHOS 91 06/22/2019 1125   BILITOT 0.6 06/22/2019 1125   BILIDIR <0.1 07/02/2012 0521   IBILI NOT CALCULATED 07/02/2012 0521      Component Value Date/Time   TSH 1.470 07/03/2018 1105      I, Trixie Dredge, am acting as transcriptionist for Ilene Qua, MD  I have reviewed the above documentation for accuracy and completeness, and I agree with the above. - Ilene Qua, MD

## 2019-07-26 ENCOUNTER — Encounter (INDEPENDENT_AMBULATORY_CARE_PROVIDER_SITE_OTHER): Payer: Self-pay | Admitting: Family Medicine

## 2019-08-21 ENCOUNTER — Other Ambulatory Visit: Payer: Self-pay | Admitting: Internal Medicine

## 2019-08-25 ENCOUNTER — Other Ambulatory Visit: Payer: Self-pay | Admitting: Internal Medicine

## 2019-08-25 DIAGNOSIS — N281 Cyst of kidney, acquired: Secondary | ICD-10-CM

## 2019-09-12 ENCOUNTER — Ambulatory Visit
Admission: RE | Admit: 2019-09-12 | Discharge: 2019-09-12 | Disposition: A | Discharge status: Home / Self Care | Payer: BC Managed Care – PPO | Source: Ambulatory Visit | Attending: Internal Medicine | Admitting: Internal Medicine

## 2019-09-12 ENCOUNTER — Other Ambulatory Visit: Payer: Self-pay

## 2019-09-12 DIAGNOSIS — N281 Cyst of kidney, acquired: Secondary | ICD-10-CM

## 2019-09-12 MED ORDER — GADOBENATE DIMEGLUMINE 529 MG/ML IV SOLN
15.0000 mL | Freq: Once | INTRAVENOUS | Status: AC | PRN
  Administered 2019-09-12: 15 mL via INTRAVENOUS

## 2019-10-12 ENCOUNTER — Encounter (INDEPENDENT_AMBULATORY_CARE_PROVIDER_SITE_OTHER): Payer: Self-pay | Admitting: Family Medicine

## 2019-10-12 ENCOUNTER — Ambulatory Visit (INDEPENDENT_AMBULATORY_CARE_PROVIDER_SITE_OTHER): Payer: Medicare Other | Admitting: Family Medicine

## 2019-10-12 ENCOUNTER — Other Ambulatory Visit: Payer: Self-pay

## 2019-10-12 VITALS — BP 136/79 | HR 69 | Temp 98.3°F | Ht 64.0 in | Wt 162.0 lb

## 2019-10-12 DIAGNOSIS — E88819 Insulin resistance, unspecified: Secondary | ICD-10-CM

## 2019-10-12 DIAGNOSIS — Z6830 Body mass index (BMI) 30.0-30.9, adult: Secondary | ICD-10-CM

## 2019-10-12 DIAGNOSIS — E66811 Obesity, class 1: Secondary | ICD-10-CM

## 2019-10-12 DIAGNOSIS — E669 Obesity, unspecified: Secondary | ICD-10-CM

## 2019-10-12 DIAGNOSIS — E8881 Metabolic syndrome: Secondary | ICD-10-CM

## 2019-10-13 NOTE — Progress Notes (Signed)
Office: [phone]  /  Fax: [phone]   HPI:   Chief Complaint: OBESITY Julie Moran is here to discuss her progress with her obesity treatment plan. She is on the keep a food journal with 1200 calories and 80 grams of protein daily plan and she is following her eating plan approximately 0 % of the time. She states she is walking 60 minutes 4 times per week. Julie Moran has moved to [employer] and they prepare all of her meals. They also have desserts every night which she has been having. Julie Moran usually has a shake for breakfast. Her goal weight is 150 pounds. She has gained 10 pounds since moving to [employer]. Her weight is 162 lb (73.5 kg) today and has had a weight gain of 10 pounds since her last in-office visit. She has lost 21 lbs since starting treatment with [place].  Insulin Resistance Julie Moran has a diagnosis of insulin resistance based on her elevated fasting insulin level >5. Although Julie Moran's blood glucose readings are still under good control, insulin resistance puts her at greater risk of metabolic syndrome and diabetes. She has been off the plan recently, so she is unsure if she has polyphagia. Julie Moran continues to work on diet and exercise to decrease risk of diabetes. Lab Results  Component Value Date   HGBA1C 5.2 10/15/2018    ASSESSMENT AND PLAN:  Insulin resistance  Class 1 obesity with serious comorbidity and body mass index (BMI) of 30.0 to 30.9 in adult, unspecified obesity type - BMI Greater than 30 at start of visit   PLAN:  Insulin Resistance Julie Moran will continue to work on weight loss, exercise, and decreasing simple carbohydrates in her diet to help decrease the risk of diabetes. She was informed that eating too many simple carbohydrates or too many calories at one sitting increases the likelihood of GI side effects. Tailor will continue with the meal plan and follow up with [place] as directed to monitor her progress.  Obesity Julie Moran is currently in the action stage of  change. As such, her goal is to continue with weight loss efforts She has agreed to keep a food journal with 1200 calories and 80 grams of protein daily Julie Moran will continue walking 60 minutes, 4 times per week for weight loss and overall health benefits, and she will add resistance exercise 2 times per week. We discussed the following Behavioral Modification Strategies today: planning for success, increasing lean protein intake, decreasing simple carbohydrates and work on meal planning and easy cooking plans We will do indirect calorimetry at the next visit. We discussed strategies for choosing menu items at [facility].  Julie Moran has agreed to follow up with our clinic in 3 weeks. She was informed of the importance of frequent follow up visits to maximize her success with intensive lifestyle modifications for her multiple health conditions.  ALLERGIES: Allergies  Allergen Reactions  . No Known Allergies     MEDICATIONS: Current Outpatient Medications on File Prior to Visit  Medication Sig Dispense Refill  . cholecalciferol (VITAMIN D3) 25 MCG (1000 UT) tablet Take 1,000 Units by mouth daily.    . pravastatin (PRAVACHOL) 20 MG tablet     . vitamin C (ASCORBIC ACID) 250 MG tablet Take 250 mg by mouth daily.    . Vitamin D, Ergocalciferol, (DRISDOL) 1.25 MG (50000 UT) CAPS capsule Take 1 capsule (50,000 Units total) by mouth every 7 (seven) days. 4 capsule 0  . amLODipine (NORVASC) 5 MG tablet Take 1 tablet by mouth Daily. Take 1/2  daily    . Nutritional Supplements (JUICE PLUS FIBRE PO) Take 2 tablets by mouth 3 (three) times daily.     No current facility-administered medications on file prior to visit.     PAST MEDICAL HISTORY: Past Medical History:  Diagnosis Date  . Allergy   . Atrophic vaginitis   . Birt-Hogg-Dube syndrome    lung cysts   . Birt-Hogg-Dube syndrome   . Chest pain   . Colon polyps    adenomatous  . Constipation   . Depression   . Diverticulosis   . Dry skin    . Fatty liver   . Floaters   . Gall bladder disease   . Glaucoma   . Heart murmur   . Herpes simplex   . Hyperlipidemia   . Hypertension   . Joint pain   . Lichen sclerosus   . Pancreatitis   . Pancreatitis   . Pneumothorax, right    spontaneous  . Shortness of breath   . Shortness of breath on exertion   . Swelling of both lower extremities   . Trouble in sleeping     PAST SURGICAL HISTORY: Past Surgical History:  Procedure Laterality Date  . BREAST BIOPSY  03/29/2006  . BREAST BIOPSY Bilateral 06/14/1998  . BREAST EXCISIONAL BIOPSY Bilateral 1999  . BREAST LUMPECTOMY  [phone]   x5 times total , both breasts  . CHOLECYSTECTOMY    . COLONOSCOPY    . hysterctomy     abdominal  . POLYPECTOMY    . right ovary removed    . UPPER GASTROINTESTINAL ENDOSCOPY      SOCIAL HISTORY: Social History   Tobacco Use  . Smoking status: Never Smoker  . Smokeless tobacco: Never Used  Substance Use Topics  . Alcohol use: No    Alcohol/week: 0.0 standard drinks  . Drug use: No    FAMILY HISTORY: Family History  Problem Relation Age of Onset  . Diabetes Mother   . Breast cancer Mother   . CVA Mother   . Hypertension Mother   . Heart disease Mother   . Thyroid disease Mother   . Cancer Mother   . Diabetes Father   . Prostate cancer Father   . Heart disease Father   . Hypertension Father   . Hyperlipidemia Father   . Thyroid disease Father   . Cancer Father   . Obesity Father   . Liver disease Paternal Grandmother   . Breast cancer Maternal Aunt        x 3  . CVA Paternal Grandfather   . Colon cancer Neg Hx   . Esophageal cancer Neg Hx   . Stomach cancer Neg Hx   . Rectal cancer Neg Hx     ROS: Review of Systems  Constitutional: Negative for weight loss.  Endo/Heme/Allergies:            PHYSICAL EXAM: Blood pressure 136/79, pulse 69, temperature 98.3 F (36.8 C), temperature source Oral, height 5\' 4"  (1.626 m), weight 162 lb (73.5 kg), SpO2 97  %. Body mass index is 27.81 kg/m. Physical Exam Vitals signs reviewed.  Constitutional:      Appearance: Normal appearance. She is well-developed. She is obese.  Cardiovascular:     Rate and Rhythm: Normal rate.  Pulmonary:     Effort: Pulmonary effort is normal.  Musculoskeletal: Normal range of motion.  Skin:    General: Skin is warm and dry.  Neurological:     Mental Status: She is  alert and oriented to person, place, and time.  Psychiatric:        Mood and Affect: Mood normal.        Behavior: Behavior normal.     RECENT LABS AND TESTS: BMET    Component Value Date/Time   NA 143 06/22/2019 1125   K 4.1 06/22/2019 1125   CL 105 06/22/2019 1125   CO2 24 06/22/2019 1125   GLUCOSE 96 06/22/2019 1125   GLUCOSE 106 (H) 07/02/2012 0510   BUN 17 06/22/2019 1125   CREATININE 1.04 (H) 06/22/2019 1125   CALCIUM 9.4 06/22/2019 1125   GFRNONAA 57 (L) 06/22/2019 1125   GFRAA 65 06/22/2019 1125   Lab Results  Component Value Date   HGBA1C 5.2 10/15/2018   Lab Results  Component Value Date   INSULIN 13.8 10/15/2018   INSULIN 16.4 07/03/2018   CBC    Component Value Date/Time   WBC 7.8 07/02/2012 0510   RBC 4.79 07/02/2012 0510   HGB 14.4 07/02/2012 0510   HCT 40.9 07/02/2012 0510   PLT 165 07/02/2012 0510   MCV 85.4 07/02/2012 0510   MCH 30.1 07/02/2012 0510   MCHC 35.2 07/02/2012 0510   RDW 12.9 07/02/2012 0510   LYMPHSABS 2.7 12/27/2008 1722   MONOABS 0.4 12/27/2008 1722   EOSABS 0.1 12/27/2008 1722   BASOSABS 0.0 12/27/2008 1722   Iron/TIBC/Ferritin/ %Sat No results found for: IRON, TIBC, FERRITIN, IRONPCTSAT Lipid Panel  No results found for: CHOL, TRIG, HDL, CHOLHDL, VLDL, LDLCALC, LDLDIRECT Hepatic Function Panel     Component Value Date/Time   PROT 6.8 06/22/2019 1125   ALBUMIN 4.3 06/22/2019 1125   AST 18 06/22/2019 1125   ALT 22 06/22/2019 1125   ALKPHOS 91 06/22/2019 1125   BILITOT 0.6 06/22/2019 1125   BILIDIR <0.1 07/02/2012 0521   IBILI  NOT CALCULATED 07/02/2012 0521      Component Value Date/Time   TSH 1.470 07/03/2018 1105     Ref. Range 06/22/2019 11:25  Vitamin D, 25-Hydroxy Latest Ref Range: 30.0 - 100.0 ng/mL 41.7    OBESITY BEHAVIORAL INTERVENTION VISIT  Today's visit was # 23  Starting weight: 183 lbs Starting date: 07/03/2018 Today's weight : 162 lbs Today's date: 10/12/2019 Total lbs lost to date: 21    10/12/2019  Height 5\' 4"  (1.626 m)  Weight 162 lb (73.5 kg)  BMI (Calculated) 27.79  BLOOD PRESSURE - SYSTOLIC [id number]  BLOOD PRESSURE - DIASTOLIC 79   Body Fat % 0000000 %  Total Body Water (lbs) 66.8 lbs    ASK: We discussed the diagnosis of obesity with Julie Moran today and Julie Moran agreed to give [place] permission to discuss obesity behavioral modification therapy today.  ASSESS: Julie Moran has the diagnosis of obesity and her BMI today is 27.79 Julie Moran is in the action stage of change   ADVISE: Julie Moran was educated on the multiple health risks of obesity as well as the benefit of weight loss to improve her health. She was advised of the need for long term treatment and the importance of lifestyle modifications to improve her current health and to decrease her risk of future health problems.  AGREE: Multiple dietary modification options and treatment options were discussed and  Julie Moran agreed to follow the recommendations documented in the above note.  ARRANGE: Julie Moran was educated on the importance of frequent visits to treat obesity as outlined per CMS and USPSTF guidelines and agreed to schedule her next follow up appointment today.  Corey Skains,  am acting as Location manager for [employer], FNP-C  I have reviewed the above documentation for accuracy and completeness, and I agree with the above.  - Aleph Nickson, FNP-C.

## 2019-10-14 ENCOUNTER — Encounter (INDEPENDENT_AMBULATORY_CARE_PROVIDER_SITE_OTHER): Payer: Self-pay | Admitting: Family Medicine

## 2019-10-27 ENCOUNTER — Encounter (INDEPENDENT_AMBULATORY_CARE_PROVIDER_SITE_OTHER): Payer: Self-pay | Admitting: Family Medicine

## 2019-11-03 ENCOUNTER — Ambulatory Visit (INDEPENDENT_AMBULATORY_CARE_PROVIDER_SITE_OTHER): Payer: Medicare Other | Admitting: Family Medicine

## 2019-11-03 ENCOUNTER — Encounter (INDEPENDENT_AMBULATORY_CARE_PROVIDER_SITE_OTHER): Payer: Self-pay | Admitting: Family Medicine

## 2019-11-03 ENCOUNTER — Other Ambulatory Visit: Payer: Self-pay

## 2019-11-03 VITALS — BP 156/87 | HR 62 | Temp 97.7°F | Ht 64.0 in | Wt 160.0 lb

## 2019-11-03 DIAGNOSIS — E669 Obesity, unspecified: Secondary | ICD-10-CM

## 2019-11-03 DIAGNOSIS — E7849 Other hyperlipidemia: Secondary | ICD-10-CM

## 2019-11-03 DIAGNOSIS — E8881 Metabolic syndrome: Secondary | ICD-10-CM | POA: Diagnosis not present

## 2019-11-03 DIAGNOSIS — Z6830 Body mass index (BMI) 30.0-30.9, adult: Secondary | ICD-10-CM | POA: Diagnosis not present

## 2019-11-03 DIAGNOSIS — E559 Vitamin D deficiency, unspecified: Secondary | ICD-10-CM | POA: Diagnosis not present

## 2019-11-04 LAB — INSULIN, RANDOM: INSULIN: 10.3 u[IU]/mL (ref 2.6–24.9)

## 2019-11-04 LAB — COMPREHENSIVE METABOLIC PANEL
ALT: 20 IU/L (ref 0–32)
AST: 19 IU/L (ref 0–40)
Albumin/Globulin Ratio: 2.1 (ref 1.2–2.2)
Albumin: 4.5 g/dL (ref 3.8–4.8)
Alkaline Phosphatase: 94 IU/L (ref 39–117)
BUN/Creatinine Ratio: 19 (ref 12–28)
BUN: 20 mg/dL (ref 8–27)
Bilirubin Total: 0.5 mg/dL (ref 0.0–1.2)
CO2: 25 mmol/L (ref 20–29)
Calcium: 9.5 mg/dL (ref 8.7–10.3)
Chloride: 103 mmol/L (ref 96–106)
Creatinine, Ser: 1.08 mg/dL — ABNORMAL HIGH (ref 0.57–1.00)
GFR calc Af Amer: 62 mL/min/{1.73_m2} (ref >59)
GFR calc non Af Amer: 54 mL/min/{1.73_m2} — ABNORMAL LOW (ref >59)
Globulin, Total: 2.1 g/dL (ref 1.5–4.5)
Glucose: 93 mg/dL (ref 65–99)
Potassium: 4.4 mmol/L (ref 3.5–5.2)
Sodium: 146 mmol/L — ABNORMAL HIGH (ref 134–144)
Total Protein: 6.6 g/dL (ref 6.0–8.5)

## 2019-11-04 LAB — HEMOGLOBIN A1C
Est. average glucose Bld gHb Est-mCnc: 100 mg/dL
Hgb A1c MFr Bld: 5.1 % (ref 4.8–5.6)

## 2019-11-04 LAB — LIPID PANEL WITH LDL/HDL RATIO
Cholesterol, Total: 175 mg/dL (ref 100–199)
HDL: 58 mg/dL (ref >39)
LDL Chol Calc (NIH): 96 mg/dL (ref 0–99)
LDL/HDL Ratio: 1.7 ratio (ref 0.0–3.2)
Triglycerides: 120 mg/dL (ref 0–149)
VLDL Cholesterol Cal: 21 mg/dL (ref 5–40)

## 2019-11-04 LAB — VITAMIN D 25 HYDROXY (VIT D DEFICIENCY, FRACTURES): Vit D, 25-Hydroxy: 40.3 ng/mL (ref 30.0–100.0)

## 2019-11-04 NOTE — Progress Notes (Signed)
Office: [phone]  /  Fax: [phone]   HPI:   Chief Complaint: OBESITY Julie Moran is here to discuss her progress with her obesity treatment plan. She is keeping a food journal with 1200 calories and 80 grams of protein and is following her eating plan approximately 50 % of the time. She states she is walking and doing strength exercises for 60 minutes 3 times per week. Julie Moran has cut out some of the desserts that she was having at [employer] where she lives now. She moved there a few months ago and has struggled because of increased access to indulgent foods prepared there. She has also cut out flaxseed and chia seeds from her homemade shakes as discussed at last visit.  Julie Moran is not journaling consistently.  Her weight is 160 lb (72.6 kg) today and has had a weight loss of 1 pound over a period of 3 weeks since her last visit. She has lost 23 lbs since starting treatment with [place].  Vitamin D Deficiency Julie Moran has a diagnosis of vitamin D deficiency. She is currently on OTC vit D 2,000 IU daily. Her last vitamin D level was 41.7 on 06/22/19. Julie Moran denies chest pain or shortness of breath.  Insulin Resistance Julie Moran has a diagnosis of insulin resistance based on her elevated fasting insulin level >5. She is not taking metformin currently and continues to work on diet and exercise to decrease risk of diabetes. Julie Moran denies polyphagia. Lab Results  Component Value Date   HGBA1C 5.1 11/03/2019    Hyperlipidemia Julie Moran has hyperlipidemia and is on pravastatin 20 mg. Her last LDL was at goal at 77, HDL was 51, and her triglycerides were 104. She has been trying to improve her cholesterol levels with intensive lifestyle modification including a low saturated fat diet, exercise, and weight loss. She denies any chest pain, shortness of breath, or myalgias. The 10-year ASCVD risk score Julie Bussing DC Jr., et al., 2013) is: 10%   Values used to calculate the score:     Age: 63 years     Sex: Female      Is Non-Hispanic African American: No     Diabetic: No     Tobacco smoker: No     Systolic Blood Pressure: [id number] mmHg     Is BP treated: Yes     HDL Cholesterol: 58 mg/dL     Total Cholesterol: 175 mg/dL   ASSESSMENT AND PLAN:  Vitamin D deficiency - Plan: Vitamin D (25 hydroxy)  Insulin resistance - Plan: HgB A1c, Insulin, random  Other hyperlipidemia - Plan: Comprehensive Metabolic Panel (CMET), HgB A1c, Insulin, random, Lipid Panel With LDL/HDL Ratio  Class 1 obesity with serious comorbidity and body mass index (BMI) of 30.0 to 30.9 in adult, unspecified obesity type - BMI greater than 30 at start of program  PLAN:  Vitamin D Deficiency Julie Moran was informed that low vitamin D levels contribute to fatigue and are associated with obesity, breast, and colon cancer. Annettie agrees to continue to take OTC Vit D and will follow up for routine testing of vitamin D, at least 2-3 times per year. She was informed of the risk of over-replacement of vitamin D and agrees to not increase her dose unless she discusses this with [place] first. A vitamin D level was ordered today. Kember agrees to follow up in 3 weeks as directed.  Insulin Resistance Julie Moran will continue to work on weight loss, exercise, and decreasing simple carbohydrates in her diet to help decrease the risk  of diabetes.  We ordered an A1c and a fasting insulin and glucose today. Julie Moran agreed to follow up with [place] as directed to monitor her progress.   Hyperlipidemia Julie Moran was informed of the American Heart Association Guidelines emphasizing intensive lifestyle modifications as the first line treatment for hyperlipidemia. We discussed many lifestyle modifications today in depth, and Julie Moran will continue to work on decreasing saturated fats such as fatty red meat, butter and many fried foods. She will also increase vegetables and lean protein in her diet and continue to work on exercise and weight loss efforts. A FLP was ordered and  Tynae will follow up at the agreed upon time.  Obesity Julie Moran is currently in the action stage of change. As such, her goal is to continue with weight loss efforts. She has agreed to keep a food journal with 1200 calories and 80 grams of protein.  Julie Moran has been instructed to continue walking and strength exercises. We discussed the following Behavioral Modification Strategies today: increasing lean protein intake, decreasing simple carbohydrates, better snacking choices, planning for success, keep a strict food journal, and holiday eating strategies. An IC will be performed at her next visit.  Julie Moran has agreed to follow up with our clinic in 3 weeks. She was informed of the importance of frequent follow up visits to maximize her success with intensive lifestyle modifications for her multiple health conditions.  ALLERGIES: Allergies  Allergen Reactions  . No Known Allergies     MEDICATIONS: Current Outpatient Medications on File Prior to Visit  Medication Sig Dispense Refill  . amLODipine (NORVASC) 5 MG tablet Take 1 tablet by mouth Daily. Take 1/2 daily    . cholecalciferol (VITAMIN D3) 25 MCG (1000 UT) tablet Take 1,000 Units by mouth daily.    . Nutritional Supplements (JUICE PLUS FIBRE PO) Take 2 tablets by mouth 3 (three) times daily.    . pravastatin (PRAVACHOL) 20 MG tablet     . vitamin C (ASCORBIC ACID) 250 MG tablet Take 250 mg by mouth daily.     No current facility-administered medications on file prior to visit.     PAST MEDICAL HISTORY: Past Medical History:  Diagnosis Date  . Allergy   . Atrophic vaginitis   . Birt-Hogg-Dube syndrome    lung cysts   . Birt-Hogg-Dube syndrome   . Chest pain   . Colon polyps    adenomatous  . Constipation   . Depression   . Diverticulosis   . Dry skin   . Fatty liver   . Floaters   . Gall bladder disease   . Glaucoma   . Heart murmur   . Herpes simplex   . Hyperlipidemia   . Hypertension   . Joint pain   . Lichen  sclerosus   . Pancreatitis   . Pancreatitis   . Pneumothorax, right    spontaneous  . Shortness of breath   . Shortness of breath on exertion   . Swelling of both lower extremities   . Trouble in sleeping     PAST SURGICAL HISTORY: Past Surgical History:  Procedure Laterality Date  . BREAST BIOPSY  03/29/2006  . BREAST BIOPSY Bilateral 06/14/1998  . BREAST EXCISIONAL BIOPSY Bilateral 1999  . BREAST LUMPECTOMY  [phone]   x5 times total , both breasts  . CHOLECYSTECTOMY    . COLONOSCOPY    . hysterctomy     abdominal  . POLYPECTOMY    . right ovary removed    . UPPER GASTROINTESTINAL  ENDOSCOPY      SOCIAL HISTORY: Social History   Tobacco Use  . Smoking status: Never Smoker  . Smokeless tobacco: Never Used  Substance Use Topics  . Alcohol use: No    Alcohol/week: 0.0 standard drinks  . Drug use: No    FAMILY HISTORY: Family History  Problem Relation Age of Onset  . Diabetes Mother   . Breast cancer Mother   . CVA Mother   . Hypertension Mother   . Heart disease Mother   . Thyroid disease Mother   . Cancer Mother   . Diabetes Father   . Prostate cancer Father   . Heart disease Father   . Hypertension Father   . Hyperlipidemia Father   . Thyroid disease Father   . Cancer Father   . Obesity Father   . Liver disease Paternal Grandmother   . Breast cancer Maternal Aunt        x 3  . CVA Paternal Grandfather   . Colon cancer Neg Hx   . Esophageal cancer Neg Hx   . Stomach cancer Neg Hx   . Rectal cancer Neg Hx     ROS: Review of Systems  Constitutional: Positive for weight loss.  Respiratory: Negative for shortness of breath.   Cardiovascular: Negative for chest pain.  Musculoskeletal: Negative for myalgias.  Endo/Heme/Allergies:       Negative for polyphagia.    PHYSICAL EXAM: Blood pressure (!) 156/87, pulse 62, temperature 97.7 F (36.5 C), temperature source Oral, height 5\' 4"  (1.626 m), weight 160 lb (72.6 kg), SpO2 97 %. Body mass  index is 27.46 kg/m. Physical Exam Vitals signs reviewed.  Constitutional:      Appearance: Normal appearance. She is obese.  Cardiovascular:     Rate and Rhythm: Normal rate.  Pulmonary:     Effort: Pulmonary effort is normal.  Musculoskeletal: Normal range of motion.  Skin:    General: Skin is warm and dry.  Neurological:     Mental Status: She is alert and oriented to person, place, and time.  Psychiatric:        Mood and Affect: Mood normal.        Behavior: Behavior normal.     RECENT LABS AND TESTS: BMET    Component Value Date/Time   NA 146 (H) 11/03/2019 0813   K 4.4 11/03/2019 0813   CL 103 11/03/2019 0813   CO2 25 11/03/2019 0813   GLUCOSE 93 11/03/2019 0813   GLUCOSE 106 (H) 07/02/2012 0510   BUN 20 11/03/2019 0813   CREATININE 1.08 (H) 11/03/2019 0813   CALCIUM 9.5 11/03/2019 0813   GFRNONAA 54 (L) 11/03/2019 0813   GFRAA 62 11/03/2019 0813   Lab Results  Component Value Date   HGBA1C 5.1 11/03/2019   HGBA1C 5.2 10/15/2018   Lab Results  Component Value Date   INSULIN WILL FOLLOW 11/03/2019   INSULIN 13.8 10/15/2018   INSULIN 16.4 07/03/2018   CBC    Component Value Date/Time   WBC 7.8 07/02/2012 0510   RBC 4.79 07/02/2012 0510   HGB 14.4 07/02/2012 0510   HCT 40.9 07/02/2012 0510   PLT 165 07/02/2012 0510   MCV 85.4 07/02/2012 0510   MCH 30.1 07/02/2012 0510   MCHC 35.2 07/02/2012 0510   RDW 12.9 07/02/2012 0510   LYMPHSABS 2.7 12/27/2008 1722   MONOABS 0.4 12/27/2008 1722   EOSABS 0.1 12/27/2008 1722   BASOSABS 0.0 12/27/2008 1722   Iron/TIBC/Ferritin/ %Sat No results found for:  IRON, TIBC, FERRITIN, IRONPCTSAT Lipid Panel     Component Value Date/Time   CHOL WILL FOLLOW 11/03/2019 0813   TRIG 120 11/03/2019 0813   HDL WILL FOLLOW 11/03/2019 0813   LDLCALC WILL FOLLOW 11/03/2019 0813   Hepatic Function Panel     Component Value Date/Time   PROT 6.6 11/03/2019 0813   ALBUMIN 4.5 11/03/2019 0813   AST 19 11/03/2019 0813    ALT 20 11/03/2019 0813   ALKPHOS 94 11/03/2019 0813   BILITOT 0.5 11/03/2019 0813   BILIDIR <0.1 07/02/2012 0521   IBILI NOT CALCULATED 07/02/2012 0521      Component Value Date/Time   TSH 1.470 07/03/2018 1105   Results for KARRIANN, CRISTOFARO (MRN [id number]) as of 11/04/2019 08:29  Ref. Range 06/22/2019 11:25  Vitamin D, 25-Hydroxy Latest Ref Range: 30.0 - 100.0 ng/mL 41.7   OBESITY BEHAVIORAL INTERVENTION VISIT  Today's visit was # 24   Starting weight: 183 lbs Starting date: 07/03/18 Today's weight : Weight: 160 lb (72.6 kg)  Today's date: 11/03/2019 Total lbs lost to date: 23 At least 15 minutes were spent on discussing the following behavioral intervention visit.    11/03/2019  Height 5\' 4"  (1.626 m)  Weight 160 lb (72.6 kg)  BMI (Calculated) 27.45  BLOOD PRESSURE - SYSTOLIC [id number]  BLOOD PRESSURE - DIASTOLIC 87   Body Fat % 40 %  Total Body Water (lbs) 63.4 lbs    ASK: We discussed the diagnosis of obesity with Julie Moran today and Julie Moran agreed to give [place] permission to discuss obesity behavioral modification therapy today.  ASSESS: Julie Moran has the diagnosis of obesity and her BMI today is 27.45. Julie Moran is in the action stage of change   ADVISE: Julie Moran was educated on the multiple health risks of obesity as well as the benefit of weight loss to improve her health. She was advised of the need for long term treatment and the importance of lifestyle modifications to improve her current health and to decrease her risk of future health problems.  AGREE: Multiple dietary modification options and treatment options were discussed and Julie Moran agreed to follow the recommendations documented in the above note.  ARRANGE: Julie Moran was educated on the importance of frequent visits to treat obesity as outlined per CMS and USPSTF guidelines and agreed to schedule her next follow up appointment today.  Lenward Chancellor, CMA, am acting as Location manager for [employer], FNP-C.  I have reviewed the above documentation for accuracy and completeness, and I agree with the above.  - Terita Hejl, FNP-C.

## 2019-11-09 ENCOUNTER — Encounter (INDEPENDENT_AMBULATORY_CARE_PROVIDER_SITE_OTHER): Payer: Self-pay | Admitting: Family Medicine

## 2019-11-09 DIAGNOSIS — E7849 Other hyperlipidemia: Secondary | ICD-10-CM | POA: Insufficient documentation

## 2019-11-24 ENCOUNTER — Ambulatory Visit (INDEPENDENT_AMBULATORY_CARE_PROVIDER_SITE_OTHER): Payer: Medicare Other | Admitting: Family Medicine

## 2019-11-24 ENCOUNTER — Encounter (INDEPENDENT_AMBULATORY_CARE_PROVIDER_SITE_OTHER): Payer: Self-pay | Admitting: Family Medicine

## 2019-11-24 ENCOUNTER — Encounter: Payer: Self-pay | Admitting: Family Medicine

## 2019-11-24 ENCOUNTER — Other Ambulatory Visit: Payer: Self-pay

## 2019-11-24 VITALS — BP 147/67 | HR 57 | Temp 98.0°F | Ht 64.0 in | Wt 163.0 lb

## 2019-11-24 DIAGNOSIS — Z6830 Body mass index (BMI) 30.0-30.9, adult: Secondary | ICD-10-CM | POA: Diagnosis not present

## 2019-11-24 DIAGNOSIS — E669 Obesity, unspecified: Secondary | ICD-10-CM

## 2019-11-24 DIAGNOSIS — E7849 Other hyperlipidemia: Secondary | ICD-10-CM

## 2019-11-24 DIAGNOSIS — R0602 Shortness of breath: Secondary | ICD-10-CM

## 2019-11-24 DIAGNOSIS — N1831 Chronic kidney disease, stage 3a: Secondary | ICD-10-CM

## 2019-11-24 DIAGNOSIS — I10 Essential (primary) hypertension: Secondary | ICD-10-CM | POA: Diagnosis not present

## 2019-11-24 DIAGNOSIS — E559 Vitamin D deficiency, unspecified: Secondary | ICD-10-CM | POA: Diagnosis not present

## 2019-11-25 NOTE — Progress Notes (Addendum)
Office: [phone]  /  Fax: [phone]   HPI:  Chief Complaint: OBESITY Julie Moran is here to discuss her progress with her obesity treatment plan. She is on the keep a food journal with 1200 calories and 80 grams of protein daily plan and she states she is following her eating plan approximately 50 % of the time. She states she is walking and doing strength exercises 45 minutes 3 to 4 times per week.  Julie Moran's weight goal is around 150 pounds. She has struggled recently with weight loss due to having moved to a retirement community that prepares her meals and offers dessert daily. She admits to indulging in dessert too often.  Shortness of Breath on exertion Julie Moran has noticed increased shortness of breath with exertion. It has not worsened recently.   Vitamin D deficiency Julie Moran has worsening vitamin D deficiency. Her last vitamin D level decreased to 40.3 from 41.7 on 1,000 IU of vitamin D3. She increased to 5,000 IU daily a week ago. She denies fatigue. Labs were discussed with patient today.  Hyperlipidemia Julie Moran has hyperlipidemia and she is well controlled on Pravastatin 20 mg. Her last fasting lipid panel on 11/03/19, showed LDL at goal (96) and her HDL and triglycerides are normal. She denies chest pain, shortness of breath or myalgias. Labs were discussed with patient today. The 10-year ASCVD risk score Julie Moran DC Brooke Bonito., et al., 2013) is: 8.9%   Values used to calculate the score:     Age: 62 years     Sex: Female     Is Non-Hispanic African American: No     Diabetic: No     Tobacco smoker: No     Systolic Blood Pressure: [id number] mmHg     Is BP treated: Yes     HDL Cholesterol: 58 mg/dL     Total Cholesterol: 175 mg/dL   Hypertension Julie Moran is a 63 y.o. female with hypertension. She has not been taking Norvasc though it is still on her med list.. Her blood pressure has been elevated in the past.     Chronic Kidney Disease stage 3A (new) Julie Moran has worsening  chronic kidney disease. Her GFR has decreased slightly from 57 to 54.   Today's visit was # 25 Starting weight: 183 lbs Starting date: 07/03/2018 Today's weight : 163 lbs Today's date: 11/24/2019 Total lbs lost to date: 72 Total lbs lost since last in-office visit: 0  ASSESSMENT AND PLAN:  Shortness of breath on exertion  Vitamin D deficiency  Other hyperlipidemia  Essential hypertension  Stage 3a chronic kidney disease  Class 1 obesity with serious comorbidity and body mass index (BMI) of 30.0 to 30.9 in adult, unspecified obesity type - BMI greater than 30 at start of program   PLAN:  Shortness of Breath on exertion Indirect calorimetry was checked today and showed an increase to 1142. Her initial indirect calorimetry on 07/03/18 was 1055 RMR. Julie Moran's shortness of breath appears to be obesity related and exercise induced. She will continue to work on weight loss and gradually increase exercise to treat her exercise induced shortness of breath. Will continue to monitor closely.  Vitamin D Deficiency Low vitamin D level contributes to fatigue and are associated with obesity, breast, and colon cancer. Julie Moran will continue to take OTC Vit D3 @5 ,000 IU and she will follow up for routine testing of vitamin D, at least 2-3 times per year to avoid over-replacement. Julie Moran agrees to follow up as directed.  Hyperlipidemia Intensive lifestyle modifications  as the first line treatment for hyperlipidemia. We discussed many lifestyle modifications today and Julie Moran will continue to work on diet, exercise and weight loss efforts. She will continue Pravastatin 20 mg and follow up as directed.  Hypertension Julie Moran is working on healthy weight loss and exercise to improve blood pressure control. We will watch for signs of hypotension as she continues her lifestyle modifications. She will start back on Norvasc 5 mg daily and follow up as directed.  Chronic Kidney Disease stage 3A (new) Julie Moran  will avoid NSAIDs. She will increase her water intake. We will add back Norvasc to improve blood pressure control.  Obesity Julie Moran is currently in the action stage of change. As such, her goal is to continue with weight loss efforts She has agreed to keep a food journal with 1200 calories and 80 grams of protein daily Julie Moran will continue walking and doing strength exercises for 45 minutes, 3 to 4 times per week for weight loss and overall health benefits. We discussed the following Behavioral Modification Strategies today: planning for success, keep a strict food journal and decreasing simple carbohydrates   Julie Moran will limit dessert to once weekly.  Julie Moran has agreed to follow up with our clinic in 3 weeks. She was informed of the importance of frequent follow up visits to maximize her success with intensive lifestyle modifications for her multiple health conditions.  ALLERGIES: Allergies  Allergen Reactions  . No Known Allergies     MEDICATIONS: Current Outpatient Medications on File Prior to Visit  Medication Sig Dispense Refill  . amLODipine (NORVASC) 5 MG tablet Take 1 tablet by mouth Daily. Take 1/2 daily    . cholecalciferol (VITAMIN D3) 25 MCG (1000 UT) tablet Take 1,000 Units by mouth daily.    . Nutritional Supplements (JUICE PLUS FIBRE PO) Take 2 tablets by mouth 3 (three) times daily.    . pravastatin (PRAVACHOL) 20 MG tablet     . vitamin C (ASCORBIC ACID) 250 MG tablet Take 250 mg by mouth daily.     No current facility-administered medications on file prior to visit.    PAST MEDICAL HISTORY: Past Medical History:  Diagnosis Date  . Allergy   . Atrophic vaginitis   . Birt-Hogg-Dube syndrome    lung cysts   . Birt-Hogg-Dube syndrome   . Chest pain   . Colon polyps    adenomatous  . Constipation   . Depression   . Diverticulosis   . Dry skin   . Fatty liver   . Floaters   . Gall bladder disease   . Glaucoma   . Heart murmur   . Herpes simplex   .  Hyperlipidemia   . Hypertension   . Joint pain   . Lichen sclerosus   . Pancreatitis   . Pancreatitis   . Pneumothorax, right    spontaneous  . Shortness of breath   . Shortness of breath on exertion   . Swelling of both lower extremities   . Trouble in sleeping     PAST SURGICAL HISTORY: Past Surgical History:  Procedure Laterality Date  . BREAST BIOPSY  03/29/2006  . BREAST BIOPSY Bilateral 06/14/1998  . BREAST EXCISIONAL BIOPSY Bilateral 1999  . BREAST LUMPECTOMY  [phone]   x5 times total , both breasts  . CHOLECYSTECTOMY    . COLONOSCOPY    . hysterctomy     abdominal  . POLYPECTOMY    . right ovary removed    . UPPER GASTROINTESTINAL ENDOSCOPY  SOCIAL HISTORY: Social History   Tobacco Use  . Smoking status: Never Smoker  . Smokeless tobacco: Never Used  Substance Use Topics  . Alcohol use: No    Alcohol/week: 0.0 standard drinks  . Drug use: No    FAMILY HISTORY: Family History  Problem Relation Age of Onset  . Diabetes Mother   . Breast cancer Mother   . CVA Mother   . Hypertension Mother   . Heart disease Mother   . Thyroid disease Mother   . Cancer Mother   . Diabetes Father   . Prostate cancer Father   . Heart disease Father   . Hypertension Father   . Hyperlipidemia Father   . Thyroid disease Father   . Cancer Father   . Obesity Father   . Liver disease Paternal Grandmother   . Breast cancer Maternal Aunt        x 3  . CVA Paternal Grandfather   . Colon cancer Neg Hx   . Esophageal cancer Neg Hx   . Stomach cancer Neg Hx   . Rectal cancer Neg Hx     ROS: Review of Systems  Constitutional: Negative for malaise/fatigue and weight loss.  Respiratory: Positive for shortness of breath (on exertion).   Cardiovascular: Negative for chest pain.  Musculoskeletal: Negative for myalgias.    PHYSICAL EXAM: Blood pressure (!) 147/67, pulse (!) 57, temperature 98 F (36.7 C), temperature source Oral, height 5\' 4"  (1.626 m),  weight 163 lb (73.9 kg), SpO2 98 %. Body mass index is 27.98 kg/m. Physical Exam Vitals reviewed.  Constitutional:      General: She is not in acute distress.    Appearance: Normal appearance. She is well-developed. She is obese.  Cardiovascular:     Rate and Rhythm: Normal rate.  Pulmonary:     Effort: Pulmonary effort is normal.  Musculoskeletal:        General: Normal range of motion.  Skin:    General: Skin is warm and dry.  Neurological:     Mental Status: She is alert and oriented to person, place, and time.  Psychiatric:        Mood and Affect: Mood normal.        Behavior: Behavior normal.     RECENT LABS AND TESTS: BMET    Component Value Date/Time   NA 146 (H) 11/03/2019 0813   K 4.4 11/03/2019 0813   CL 103 11/03/2019 0813   CO2 25 11/03/2019 0813   GLUCOSE 93 11/03/2019 0813   GLUCOSE 106 (H) 07/02/2012 0510   BUN 20 11/03/2019 0813   CREATININE 1.08 (H) 11/03/2019 0813   CALCIUM 9.5 11/03/2019 0813   GFRNONAA 54 (L) 11/03/2019 0813   GFRAA 62 11/03/2019 0813   Lab Results  Component Value Date   HGBA1C 5.1 11/03/2019   HGBA1C 5.2 10/15/2018   Lab Results  Component Value Date   INSULIN 10.3 11/03/2019   INSULIN 13.8 10/15/2018   INSULIN 16.4 07/03/2018   CBC    Component Value Date/Time   WBC 7.8 07/02/2012 0510   RBC 4.79 07/02/2012 0510   HGB 14.4 07/02/2012 0510   HCT 40.9 07/02/2012 0510   PLT 165 07/02/2012 0510   MCV 85.4 07/02/2012 0510   MCH 30.1 07/02/2012 0510   MCHC 35.2 07/02/2012 0510   RDW 12.9 07/02/2012 0510   LYMPHSABS 2.7 12/27/2008 1722   MONOABS 0.4 12/27/2008 1722   EOSABS 0.1 12/27/2008 1722   BASOSABS 0.0 12/27/2008 1722  Iron/TIBC/Ferritin/ %Sat No results found for: IRON, TIBC, FERRITIN, IRONPCTSAT Lipid Panel     Component Value Date/Time   CHOL 175 11/03/2019 0813   TRIG 120 11/03/2019 0813   HDL 58 11/03/2019 0813   LDLCALC 96 11/03/2019 0813   Hepatic Function Panel     Component Value Date/Time    PROT 6.6 11/03/2019 0813   ALBUMIN 4.5 11/03/2019 0813   AST 19 11/03/2019 0813   ALT 20 11/03/2019 0813   ALKPHOS 94 11/03/2019 0813   BILITOT 0.5 11/03/2019 0813   BILIDIR <0.1 07/02/2012 0521   IBILI NOT CALCULATED 07/02/2012 0521      Component Value Date/Time   TSH 1.470 07/03/2018 1105     OBESITY BEHAVIORAL INTERVENTION VISIT DOCUMENTATION FOR INSURANCE (~15 minutes)    ASK: We discussed the diagnosis of obesity with Cristy Hilts Behrle today and Khyler agreed to give [place] permission to discuss obesity behavioral modification therapy today.  ASSESS: Shivani has the diagnosis of obesity and her BMI today is 27.97 Beretta is in the action stage of change   ADVISE: Briseidy was educated on the multiple health risks of obesity as well as the benefit of weight loss to improve her health. She was advised of the need for long term treatment and the importance of lifestyle modifications to improve her current health and to decrease her risk of future health problems.  AGREE: Multiple dietary modification options and treatment options were discussed and  [place] agreed to follow the recommendations documented in the above note.  ARRANGE: Jimmy was educated on the importance of frequent visits to treat obesity as outlined per CMS and USPSTF guidelines and agreed to schedule her next follow up appointment today.   I, Doreene Nest, am acting as transcriptionist for [employer], FNP-C  I have reviewed the above documentation for accuracy and completeness, and I agree with the above.  - Samreen Seltzer, FNP-C.

## 2019-11-26 ENCOUNTER — Encounter (INDEPENDENT_AMBULATORY_CARE_PROVIDER_SITE_OTHER): Payer: Self-pay | Admitting: Family Medicine

## 2019-11-26 DIAGNOSIS — N1831 Chronic kidney disease, stage 3a: Secondary | ICD-10-CM | POA: Insufficient documentation

## 2019-11-26 MED ORDER — VITAMIN D3 125 MCG (5000 UT) PO CAPS: 1.0000 | ORAL_CAPSULE | Freq: Every day | ORAL | 0 refills | Status: DC

## 2019-12-17 ENCOUNTER — Ambulatory Visit (INDEPENDENT_AMBULATORY_CARE_PROVIDER_SITE_OTHER): Payer: Medicare Other | Admitting: Family Medicine

## 2019-12-21 ENCOUNTER — Ambulatory Visit (INDEPENDENT_AMBULATORY_CARE_PROVIDER_SITE_OTHER): Payer: Medicare Other | Admitting: Family Medicine

## 2020-01-04 ENCOUNTER — Ambulatory Visit (INDEPENDENT_AMBULATORY_CARE_PROVIDER_SITE_OTHER): Payer: Medicare PPO | Admitting: Family Medicine

## 2020-01-04 ENCOUNTER — Other Ambulatory Visit: Payer: Self-pay

## 2020-01-04 VITALS — BP 134/85 | HR 66 | Temp 98.5°F | Ht 64.0 in | Wt 159.0 lb

## 2020-01-04 DIAGNOSIS — E669 Obesity, unspecified: Secondary | ICD-10-CM | POA: Diagnosis not present

## 2020-01-04 DIAGNOSIS — E8881 Metabolic syndrome: Secondary | ICD-10-CM

## 2020-01-04 DIAGNOSIS — I10 Essential (primary) hypertension: Secondary | ICD-10-CM

## 2020-01-04 DIAGNOSIS — Z6830 Body mass index (BMI) 30.0-30.9, adult: Secondary | ICD-10-CM

## 2020-01-05 NOTE — Progress Notes (Signed)
Chief Complaint:   OBESITY Julie Moran is here to discuss her progress with her obesity treatment plan along with follow-up of her obesity related diagnoses. Nile is on keeping a food journal of 1200 calories and 80 grams of protein daily plan and states she is following her eating plan approximately 60% of the time. Hortense states she is walking and doing strength exercise 60 minutes 4 to 5 times per week.  Today's visit was #: 55 Starting weight: 183 lbs Starting date: 07/03/2018 Today's weight: 159 lbs Today's date: 01/04/2020 Total lbs lost to date: 24 Total lbs lost since last in-office visit: 4  Interim History: Irianna is journaling consistently over the last three weeks. She is also being more consistent with exercise. She has cut back on breads and desserts. She is meeting her protein goal most days.  Her goal weight is 150 pounds although we discussed that a higher weight could be appropriate.   Subjective:   Essential hypertension Norvasc was added at the last visit. Toy denies chest pain or shortness of breath. Her blood pressure is well controlled.  BP Readings from Last 3 Encounters:  01/04/20 134/85  11/24/19 (!) 147/67  11/03/19 (!) 156/87   Lab Results  Component Value Date   CREATININE 1.08 (H) 11/03/2019   CREATININE 1.04 (H) 06/22/2019   CREATININE 1.04 (H) 10/15/2018   Insulin resistance Chasey has a diagnosis of insulin resistance based on her elevated fasting insulin level >5. She is avoiding desserts and bread. Nazalia denies polyphagia. She continues to work on diet and exercise to decrease her risk of diabetes.   Lab Results  Component Value Date   INSULIN 10.3 11/03/2019   INSULIN 13.8 10/15/2018   INSULIN 16.4 07/03/2018   Lab Results  Component Value Date   HGBA1C 5.1 11/03/2019   Assessment/Plan:   Essential hypertension Timora is working on healthy weight loss and exercise to improve blood pressure control. Cresta will  continue Norvasc. We will watch for signs of hypotension as she continues her lifestyle modifications.  Insulin resistance Lisanne will continue with the meal plan and she will continue to work on weight loss, exercise, and decreasing simple carbohydrates to help decrease the risk of diabetes. Etna agreed to follow-up with [place] as directed to closely monitor her progress.  Class 1 obesity with serious comorbidity and body mass index (BMI) of 30.0 to 30.9 in adult, unspecified obesity type - Starting BMI greater then [place] is currently in the action stage of change. As such, her goal is to continue with weight loss efforts. She has agreed to keeping a food journal and adhering to recommended goals of 1200 calories and 80 grams of protein daily.   Exercise goals: Rikki will continue walking and doing strength exercise for 60 minutes, 4 to 5 times per week.  Behavioral modification strategies: increasing lean protein intake, decreasing simple carbohydrates and planning for success.  Ryla has agreed to follow-up with our clinic in 3 weeks. She was informed of the importance of frequent follow-up visits to maximize her success with intensive lifestyle modifications for her multiple health conditions.   Objective:   Blood pressure 134/85, pulse 66, temperature 98.5 F (36.9 C), temperature source Oral, height 5\' 4"  (1.626 m), weight 159 lb (72.1 kg), SpO2 96 %. Body mass index is 27.29 kg/m.  General: Cooperative, alert, well developed, in no acute distress. HEENT: Conjunctivae and lids unremarkable. Cardiovascular: Regular rhythm.  Lungs: Normal work of breathing. Neurologic: No  focal deficits.   Lab Results  Component Value Date   CREATININE 1.08 (H) 11/03/2019   BUN 20 11/03/2019   NA 146 (H) 11/03/2019   K 4.4 11/03/2019   CL 103 11/03/2019   CO2 25 11/03/2019   Lab Results  Component Value Date   ALT 20 11/03/2019   AST 19 11/03/2019   ALKPHOS 94 11/03/2019   BILITOT 0.5  11/03/2019   Lab Results  Component Value Date   HGBA1C 5.1 11/03/2019   HGBA1C 5.2 10/15/2018   Lab Results  Component Value Date   INSULIN 10.3 11/03/2019   INSULIN 13.8 10/15/2018   INSULIN 16.4 07/03/2018   Lab Results  Component Value Date   TSH 1.470 07/03/2018   Lab Results  Component Value Date   CHOL 175 11/03/2019   HDL 58 11/03/2019   LDLCALC 96 11/03/2019   TRIG 120 11/03/2019   Lab Results  Component Value Date   WBC 7.8 07/02/2012   HGB 14.4 07/02/2012   HCT 40.9 07/02/2012   MCV 85.4 07/02/2012   PLT 165 07/02/2012   No results found for: IRON, TIBC, FERRITIN   Ref. Range 11/03/2019 08:13  Vitamin D, 25-Hydroxy Latest Ref Range: 30.0 - 100.0 ng/mL 40.3   Obesity Behavioral Intervention Documentation for Insurance:   Approximately 15 minutes were spent on the discussion below.  ASK: We discussed the diagnosis of obesity with Judeen Hammans today and Briara agreed to give [place] permission to discuss obesity behavioral modification therapy today.  ASSESS: Shammara has the diagnosis of obesity and her BMI today is 27.28. Terrylynn is in the action stage of change.   ADVISE: Sherrina was educated on the multiple health risks of obesity as well as the benefit of weight loss to improve her health. She was advised of the need for long term treatment and the importance of lifestyle modifications to improve her current health and to decrease her risk of future health problems.  AGREE: Multiple dietary modification options and treatment options were discussed and [place] agreed to follow the recommendations documented in the above note.  ARRANGE: Audrianne was educated on the importance of frequent visits to treat obesity as outlined per CMS and USPSTF guidelines and agreed to schedule her next follow up appointment today.  Attestation Statements:   Reviewed by clinician on day of visit: allergies, medications, problem list, medical history, surgical history, family history,  social history, and previous encounter notes.  Corey Skains, am acting as Location manager for [employer], FNP-C.  I have reviewed the above documentation for accuracy and completeness, and I agree with the above. -  Arlissa Monteverde [employer], FNP-C

## 2020-01-06 ENCOUNTER — Encounter (INDEPENDENT_AMBULATORY_CARE_PROVIDER_SITE_OTHER): Payer: Self-pay | Admitting: Family Medicine

## 2020-01-25 ENCOUNTER — Ambulatory Visit (INDEPENDENT_AMBULATORY_CARE_PROVIDER_SITE_OTHER): Payer: Medicare PPO | Admitting: Family Medicine

## 2020-01-25 ENCOUNTER — Other Ambulatory Visit: Payer: Self-pay

## 2020-01-25 ENCOUNTER — Encounter (INDEPENDENT_AMBULATORY_CARE_PROVIDER_SITE_OTHER): Payer: Self-pay | Admitting: Family Medicine

## 2020-01-25 VITALS — BP 125/78 | HR 55 | Temp 97.9°F | Ht 64.0 in | Wt 159.0 lb

## 2020-01-25 DIAGNOSIS — I10 Essential (primary) hypertension: Secondary | ICD-10-CM

## 2020-01-25 DIAGNOSIS — E669 Obesity, unspecified: Secondary | ICD-10-CM | POA: Diagnosis not present

## 2020-01-25 DIAGNOSIS — E8881 Metabolic syndrome: Secondary | ICD-10-CM

## 2020-01-25 DIAGNOSIS — Z6830 Body mass index (BMI) 30.0-30.9, adult: Secondary | ICD-10-CM

## 2020-01-25 NOTE — Progress Notes (Signed)
Chief Complaint:   OBESITY Julie Moran is here to discuss her progress with her obesity treatment plan along with follow-up of her obesity related diagnoses. Julie Moran is on the keeping a food journal of 1200 calories and 80 grams of protein daily plan and states she is following her eating plan approximately 70% of the time. Julie Moran states sheis walking, doing strength exercises and water aerobics 45 to 60 minutes 5 times per week.  Today's visit was #: 70 Starting weight: 183 lbs Starting date: 07/03/2018 Today's weight: 159 lbs Today's date: 01/25/2020 Total lbs lost to date: 24 Total lbs lost since last in-office visit: 0  Interim History: Julie Moran journals about four days per week, which is more than she had been doing. She admits to having desserts two times per week. She lives in independent living where some of the meals are provided.  Julie Moran does not tend to eat enough vegetables because she doesn't like the vegetables at [employer] She does skip dinner at times and she snacks on simple carbs such as Nutella or bread sticks. Her goal weight is 150 lbs.   Subjective:   Insulin resistance Zanaria has a diagnosis of insulin resistance. Odyssey denies cravings, and she has no polyphagia. She continues to work on diet and exercise to decrease her risk of diabetes.  Lab Results  Component Value Date   INSULIN 10.3 11/03/2019   INSULIN 13.8 10/15/2018   INSULIN 16.4 07/03/2018   Lab Results  Component Value Date   HGBA1C 5.1 11/03/2019   Essential hypertension Julie Moran's blood pressure is well controlled with Norvasc.  BP Readings from Last 3 Encounters:  01/25/20 125/78  01/04/20 134/85  11/24/19 (!) 147/67   Lab Results  Component Value Date   CREATININE 1.08 (H) 11/03/2019   CREATININE 1.04 (H) 06/22/2019   CREATININE 1.04 (H) 10/15/2018   Assessment/Plan:   Insulin resistance Julie Moran will continue with the meal plan and she will continue to work on weight  loss, exercise, and decreasing simple carbohydrates to help decrease the risk of diabetes. Julie Moran agreed to follow-up with [place] as directed to closely monitor her progress.  Essential hypertension Julie Moran is working on healthy weight loss and exercise to improve blood pressure control. She will continue Norvasc. We will watch for signs of hypotension as she continues her lifestyle modifications.  Class 1 obesity with serious comorbidity and body mass index (BMI) of 30.0 to 30.9 in adult, unspecified obesity type - Starting BMI greater then Julie Moran is currently in the action stage of change. As such, her goal is to continue with weight loss efforts. She has agreed to keeping a food journal and adhering to recommended goals of 1200 calories and 80 grams of protein daily.   Exercise goals: Julie Moran will continue her current exercise regimen and she will increase weights to 2 times per week.  Behavioral modification strategies: increasing lean protein intake, decreasing simple carbohydrates, no skipping meals, better snacking choices and keeping a strict food journal. Julie Moran will increase journaling to 6 to 7 days per week. Encouraged her to choose protein rich snacks.  Handouts: 100 calorie snacks  Julie Moran has agreed to follow-up with our clinic in 3 weeks fasting. She was informed of the importance of frequent follow-up visits to maximize her success with intensive lifestyle modifications for her multiple health conditions.   Objective:   Blood pressure 125/78, pulse (!) 55, temperature 97.9 F (36.6 C), temperature source Oral, height 5\' 4"  (1.626 m), weight 159  lb (72.1 kg), SpO2 98 %. Body mass index is 27.29 kg/m.  General: Cooperative, alert, well developed, in no acute distress. HEENT: Conjunctivae and lids unremarkable. Cardiovascular: Regular rhythm.  Lungs: Normal work of breathing. Neurologic: No focal deficits.   Lab Results  Component Value Date   CREATININE 1.08 (H) 11/03/2019    BUN 20 11/03/2019   NA 146 (H) 11/03/2019   K 4.4 11/03/2019   CL 103 11/03/2019   CO2 25 11/03/2019   Lab Results  Component Value Date   ALT 20 11/03/2019   AST 19 11/03/2019   ALKPHOS 94 11/03/2019   BILITOT 0.5 11/03/2019   Lab Results  Component Value Date   HGBA1C 5.1 11/03/2019   HGBA1C 5.2 10/15/2018   Lab Results  Component Value Date   INSULIN 10.3 11/03/2019   INSULIN 13.8 10/15/2018   INSULIN 16.4 07/03/2018   Lab Results  Component Value Date   TSH 1.470 07/03/2018   Lab Results  Component Value Date   CHOL 175 11/03/2019   HDL 58 11/03/2019   LDLCALC 96 11/03/2019   TRIG 120 11/03/2019   Lab Results  Component Value Date   WBC 7.8 07/02/2012   HGB 14.4 07/02/2012   HCT 40.9 07/02/2012   MCV 85.4 07/02/2012   PLT 165 07/02/2012   No results found for: IRON, TIBC, FERRITIN  Obesity Behavioral Intervention Documentation for Insurance:   Approximately 15 minutes were spent on the discussion below.  ASK: We discussed the diagnosis of obesity with Julie Moran today and Julie Moran agreed to give [place] permission to discuss obesity behavioral modification therapy today.  ASSESS: Julie Moran has the diagnosis of obesity and her BMI today is 27.28. Julie Moran is in the action stage of change.   ADVISE: Julie Moran was educated on the multiple health risks of obesity as well as the benefit of weight loss to improve her health. She was advised of the need for long term treatment and the importance of lifestyle modifications to improve her current health and to decrease her risk of future health problems.  AGREE: Multiple dietary modification options and treatment options were discussed and Julie Moran agreed to follow the recommendations documented in the above note.  ARRANGE: Julie Moran was educated on the importance of frequent visits to treat obesity as outlined per CMS and USPSTF guidelines and agreed to schedule her next follow up appointment today.  Attestation Statements:    Reviewed by clinician on day of visit: allergies, medications, problem list, medical history, surgical history, family history, social history, and previous encounter notes.  Corey Skains, am acting as Location manager for [employer], FNP-C.  I have reviewed the above documentation for accuracy and completeness, and I agree with the above. -  Dawn [employer], FNP-C

## 2020-02-08 HISTORY — PX: OTHER SURGICAL HISTORY: SHX169

## 2020-02-15 ENCOUNTER — Other Ambulatory Visit: Payer: Self-pay

## 2020-02-15 ENCOUNTER — Encounter (INDEPENDENT_AMBULATORY_CARE_PROVIDER_SITE_OTHER): Payer: Self-pay | Admitting: Family Medicine

## 2020-02-15 ENCOUNTER — Ambulatory Visit (INDEPENDENT_AMBULATORY_CARE_PROVIDER_SITE_OTHER): Payer: Medicare PPO | Admitting: Family Medicine

## 2020-02-15 VITALS — BP 120/67 | HR 61 | Temp 98.0°F | Ht 64.0 in | Wt 161.0 lb

## 2020-02-15 DIAGNOSIS — E8881 Metabolic syndrome: Secondary | ICD-10-CM

## 2020-02-15 DIAGNOSIS — R944 Abnormal results of kidney function studies: Secondary | ICD-10-CM | POA: Diagnosis not present

## 2020-02-15 DIAGNOSIS — E559 Vitamin D deficiency, unspecified: Secondary | ICD-10-CM | POA: Diagnosis not present

## 2020-02-15 DIAGNOSIS — Z6830 Body mass index (BMI) 30.0-30.9, adult: Secondary | ICD-10-CM | POA: Diagnosis not present

## 2020-02-15 DIAGNOSIS — E669 Obesity, unspecified: Secondary | ICD-10-CM | POA: Diagnosis not present

## 2020-02-15 NOTE — Progress Notes (Signed)
Chief Complaint:   OBESITY Julie Moran is here to discuss her progress with her obesity treatment plan along with follow-up of her obesity related diagnoses. Julie Moran is on keeping a food journal and adhering to recommended goals of 1200 calories and 80 grams of protein daily and states she is following her eating plan approximately 50% of the time. Julie Moran states she is walking and doing strength training for 90 minutes 5 times per week.  Today's visit was #: 28 Starting weight: 183 lbs Starting date: 07/03/2018 Today's weight: 161 lbs Today's date: 02/15/2020 Total lbs lost to date: 22 Total lbs lost since last in-office visit: 0  Interim History: Julie Moran lives in independent living where some of the meals are provided and desserts are provided daily. Julie Moran reports eating more desserts recently though she is trying to only have dessert 1 time per week.  She is not journaling consistently because she finds it difficult to track meals due to her foods being prepared for her.   She is using a fairly high calorie dressing on her salads. We discussed the Low Carbohydrate plan but she does not feel that it would work well for her.  Subjective:   1. Vitamin D deficiency Julie Moran last Vit D was low at 40. She is on OTC Vit D 5,000 IU daily.  2. Decreased GFR Julie Moran's GFR has been low at last 2 checks.  3. Insulin resistance Julie Moran is not on metformin, and she denies polyphagia.  Assessment/Plan:   1. Vitamin D deficiency Low Vitamin D level contributes to fatigue and are associated with obesity, breast, and colon cancer. We will check labs today. Julie Moran will follow-up for routine testing of Vitamin D, at least 2-3 times per year to avoid over-replacement.  - VITAMIN D 25 Hydroxy (Vit-D Deficiency, Fractures)  2. Decreased GFR We will check labs today and follow up.  - Comprehensive metabolic panel  3. Insulin resistance Julie Moran will continue to work on weight loss, exercise, and  decreasing simple carbohydrates to help decrease the risk of diabetes. Julie Moran agreed to follow-up with [place] as directed to closely monitor her progress. We will check labs today.  - Hemoglobin A1c - Insulin, random  4. Class 1 obesity with serious comorbidity and body mass index (BMI) of 30.0 to 30.9 in adult, unspecified obesity type Julie Moran is currently in the action stage of change. As such, her goal is to continue with weight loss efforts. She has agreed to keeping a food journal and adhering to recommended goals of 1200 calories and 80 grams of protein daily.   Exercise goals: Julie Moran is to continue her current exercise regimen as is.  Behavioral modification strategies: decreasing simple carbohydrates, meal planning and cooking strategies, planning for success and keeping a strict food journal.  We discussed substitutions for high calorie salad dressing.  Julie Moran has agreed to follow-up with our clinic in 3 weeks with Dr. Jearld Moran. She was informed of the importance of frequent follow-up visits to maximize her success with intensive lifestyle modifications for her multiple health conditions.   Julie Moran was informed we would discuss her lab results at her next visit unless there is a critical issue that needs to be addressed sooner. Julie Moran agreed to keep her next visit at the agreed upon time to discuss these results.  Objective:   Blood pressure 120/67, pulse 61, temperature 98 F (36.7 C), temperature source Oral, height 5\' 4"  (1.626 m), weight 161 lb (73 kg), SpO2 96 %. Body mass index is  27.64 kg/m.  General: Cooperative, alert, well developed, in no acute distress. HEENT: Conjunctivae and lids unremarkable. Cardiovascular: Regular rhythm.  Lungs: Normal work of breathing. Neurologic: No focal deficits.   Lab Results  Component Value Date   CREATININE 1.08 (H) 11/03/2019   BUN 20 11/03/2019   NA 146 (H) 11/03/2019   K 4.4 11/03/2019   CL 103 11/03/2019   CO2 25 11/03/2019    Lab Results  Component Value Date   ALT 20 11/03/2019   AST 19 11/03/2019   ALKPHOS 94 11/03/2019   BILITOT 0.5 11/03/2019   Lab Results  Component Value Date   HGBA1C 5.1 11/03/2019   HGBA1C 5.2 10/15/2018   Lab Results  Component Value Date   INSULIN 10.3 11/03/2019   INSULIN 13.8 10/15/2018   INSULIN 16.4 07/03/2018   Lab Results  Component Value Date   TSH 1.470 07/03/2018   Lab Results  Component Value Date   CHOL 175 11/03/2019   HDL 58 11/03/2019   LDLCALC 96 11/03/2019   TRIG 120 11/03/2019   Lab Results  Component Value Date   WBC 7.8 07/02/2012   HGB 14.4 07/02/2012   HCT 40.9 07/02/2012   MCV 85.4 07/02/2012   PLT 165 07/02/2012   No results found for: IRON, TIBC, FERRITIN  Obesity Behavioral Intervention Documentation for Insurance:   Approximately 15 minutes were spent on the discussion below.  ASK: We discussed the diagnosis of obesity with Julie Moran today and Julie Moran agreed to give [place] permission to discuss obesity behavioral modification therapy today.  ASSESS: Julie Moran has the diagnosis of obesity and her BMI today is 27.62. Julie Moran is in the action stage of change.   ADVISE: Julie Moran was educated on the multiple health risks of obesity as well as the benefit of weight loss to improve her health. She was advised of the need for long term treatment and the importance of lifestyle modifications to improve her current health and to decrease her risk of future health problems.  AGREE: Multiple dietary modification options and treatment options were discussed and Julie Moran agreed to follow the recommendations documented in the above note.  ARRANGE: Julie Moran was educated on the importance of frequent visits to treat obesity as outlined per CMS and USPSTF guidelines and agreed to schedule her next follow up appointment today.  Attestation Statements:   Reviewed by clinician on day of visit: allergies, medications, problem list, medical history, surgical  history, family history, social history, and previous encounter notes.   Wilhemena Durie, am acting as Location manager for [employer], FNP-C.  I have reviewed the above documentation for accuracy and completeness, and I agree with the above. - Georgianne Fick, FNP

## 2020-02-16 ENCOUNTER — Encounter (INDEPENDENT_AMBULATORY_CARE_PROVIDER_SITE_OTHER): Payer: Self-pay | Admitting: Family Medicine

## 2020-02-16 LAB — COMPREHENSIVE METABOLIC PANEL
ALT: 24 IU/L (ref 0–32)
AST: 19 IU/L (ref 0–40)
Albumin/Globulin Ratio: 2 (ref 1.2–2.2)
Albumin: 4.5 g/dL (ref 3.8–4.8)
Alkaline Phosphatase: 96 IU/L (ref 39–117)
BUN/Creatinine Ratio: 20 (ref 12–28)
BUN: 18 mg/dL (ref 8–27)
Bilirubin Total: 0.5 mg/dL (ref 0.0–1.2)
CO2: 23 mmol/L (ref 20–29)
Calcium: 9.3 mg/dL (ref 8.7–10.3)
Chloride: 107 mmol/L — ABNORMAL HIGH (ref 96–106)
Creatinine, Ser: 0.91 mg/dL (ref 0.57–1.00)
GFR calc Af Amer: 77 mL/min/{1.73_m2} (ref >59)
GFR calc non Af Amer: 66 mL/min/{1.73_m2} (ref >59)
Globulin, Total: 2.3 g/dL (ref 1.5–4.5)
Glucose: 104 mg/dL — ABNORMAL HIGH (ref 65–99)
Potassium: 4 mmol/L (ref 3.5–5.2)
Sodium: 146 mmol/L — ABNORMAL HIGH (ref 134–144)
Total Protein: 6.8 g/dL (ref 6.0–8.5)

## 2020-02-16 LAB — HEMOGLOBIN A1C
Est. average glucose Bld gHb Est-mCnc: 97 mg/dL
Hgb A1c MFr Bld: 5 % (ref 4.8–5.6)

## 2020-02-16 LAB — INSULIN, RANDOM: INSULIN: 8.8 u[IU]/mL (ref 2.6–24.9)

## 2020-02-16 LAB — VITAMIN D 25 HYDROXY (VIT D DEFICIENCY, FRACTURES): Vit D, 25-Hydroxy: 57.8 ng/mL (ref 30.0–100.0)

## 2020-02-17 ENCOUNTER — Encounter (INDEPENDENT_AMBULATORY_CARE_PROVIDER_SITE_OTHER): Payer: Self-pay | Admitting: Family Medicine

## 2020-02-17 NOTE — Telephone Encounter (Signed)
Please advise 

## 2020-02-18 DIAGNOSIS — N6310 Unspecified lump in the right breast, unspecified quadrant: Secondary | ICD-10-CM | POA: Diagnosis not present

## 2020-03-08 ENCOUNTER — Other Ambulatory Visit: Payer: Self-pay

## 2020-03-08 ENCOUNTER — Ambulatory Visit (INDEPENDENT_AMBULATORY_CARE_PROVIDER_SITE_OTHER): Payer: Medicare PPO | Admitting: Family Medicine

## 2020-03-08 ENCOUNTER — Encounter (INDEPENDENT_AMBULATORY_CARE_PROVIDER_SITE_OTHER): Payer: Self-pay | Admitting: Family Medicine

## 2020-03-08 VITALS — BP 122/73 | HR 62 | Temp 97.6°F | Ht 64.0 in | Wt 160.0 lb

## 2020-03-08 DIAGNOSIS — I10 Essential (primary) hypertension: Secondary | ICD-10-CM | POA: Diagnosis not present

## 2020-03-08 DIAGNOSIS — E669 Obesity, unspecified: Secondary | ICD-10-CM | POA: Diagnosis not present

## 2020-03-08 DIAGNOSIS — Z6830 Body mass index (BMI) 30.0-30.9, adult: Secondary | ICD-10-CM | POA: Diagnosis not present

## 2020-03-08 DIAGNOSIS — E559 Vitamin D deficiency, unspecified: Secondary | ICD-10-CM | POA: Diagnosis not present

## 2020-03-08 DIAGNOSIS — E66811 Obesity, class 1: Secondary | ICD-10-CM

## 2020-03-08 NOTE — Progress Notes (Signed)
Chief Complaint:   OBESITY Julie Moran is here to discuss her progress with her obesity treatment plan along with follow-up of her obesity related diagnoses. 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 Julie Moran states she is swimming/strengthening/walking 4-6 hours a week.   Today's visit was #: 31 Starting weight: 183 lbs Starting date: 07/03/2018 Today's weight: 160 lbs Today's date: 03/08/2020 Total lbs lost to date: 23  Total lbs lost since last in-office visit: 1  Interim History: Julie Moran has moved into [employer] and is really enjoying life. She has only journaled ~8 days in the last 3 weeks. She has barely lost any muscle mass in comparison to her initial scale strip. It is hard to track [facility] because she doesn't always know what she is getting. The days she misses journaling are the days when she is busy.  Subjective:   Vitamin D deficiency. Last Vitamin D 57.8 on 02/15/2020. Julie Moran is on Vitamin D 5,000 IU daily.  Essential hypertension. Blood pressure is well controlled today. Julie Moran started taking amlodipine 5 mg again (previously was on 2.5 mg).  BP Readings from Last 3 Encounters:  03/08/20 122/73  02/15/20 120/67  01/25/20 125/78   Lab Results  Component Value Date   CREATININE 0.91 02/15/2020   CREATININE 1.08 (H) 11/03/2019   CREATININE 1.04 (H) 06/22/2019   Assessment/Plan:   Vitamin D deficiency. Low Vitamin D level contributes to fatigue and are associated with obesity, breast, and colon cancer. She agrees to continue to take OTC Vitamin D and will follow-up for routine testing of Vitamin D, at least 2-3 times per year to avoid over-replacement.  Essential hypertension. Julie Moran is working on healthy weight loss and exercise to improve blood pressure control. We will watch for signs of hypotension as she  continues her lifestyle modifications. Will have follow-up blood pressure at her next appointment. If it is still controlled, will decrease amlodipine to 2.5 mg.  Class 1 obesity with serious comorbidity and body mass index (BMI) of 30.0 to 30.9 in adult, unspecified obesity type.  Julie Moran is currently in the action stage of change. As such, her goal is to continue with weight loss efforts. She has agreed to keeping a food journal and adhering to recommended goals of 1200 calories and 80 grams of protein daily.   Exercise goals: Julie Moran will continue her current exercise regimen.  Behavioral modification strategies: increasing lean protein intake, increasing vegetables, meal planning and cooking strategies, keeping healthy foods in the home and planning for success.  Julie Moran has agreed to follow-up with our clinic in 3 weeks. She was informed of the importance of frequent follow-up visits to maximize her success with intensive lifestyle modifications for her multiple health conditions.   Objective:   Blood pressure 122/73, pulse 62, temperature 97.6 F (36.4 C), temperature source Oral, height 5\' 4"  (1.626 m), weight 160 lb (72.6 kg), SpO2 94 %. Body mass index is 27.46 kg/m.  General: Cooperative, alert, well developed, in no acute distress. HEENT: Conjunctivae and lids unremarkable. Cardiovascular: Regular rhythm.  Lungs: Normal work of breathing. Neurologic: No focal deficits.   Lab Results  Component Value Date   CREATININE 0.91 02/15/2020   BUN 18 02/15/2020   NA 146 (H) 02/15/2020   K 4.0 02/15/2020   CL 107 (H) 02/15/2020   CO2 23 02/15/2020   Lab  Results  Component Value Date   ALT 24 02/15/2020   AST 19 02/15/2020   ALKPHOS 96 02/15/2020   BILITOT 0.5 02/15/2020   Lab Results  Component Value Date   HGBA1C 5.0 02/15/2020   HGBA1C 5.1 11/03/2019   HGBA1C 5.2 10/15/2018   Lab Results  Component Value Date   INSULIN 8.8 02/15/2020   INSULIN 10.3 11/03/2019    INSULIN 13.8 10/15/2018   INSULIN 16.4 07/03/2018   Lab Results  Component Value Date   TSH 1.470 07/03/2018   Lab Results  Component Value Date   CHOL 175 11/03/2019   HDL 58 11/03/2019   LDLCALC 96 11/03/2019   TRIG 120 11/03/2019   Lab Results  Component Value Date   WBC 7.8 07/02/2012   HGB 14.4 07/02/2012   HCT 40.9 07/02/2012   MCV 85.4 07/02/2012   PLT 165 07/02/2012   No results found for: IRON, TIBC, FERRITIN  Attestation Statements:   Reviewed by clinician on day of visit: allergies, medications, problem list, medical history, surgical history, family history, social history, and previous encounter notes.  Time spent on visit including pre-visit chart review and post-visit charting and care was 17 minutes.   I, Michaelene Song, am acting as transcriptionist for Coralie Common, MD   I have reviewed the above documentation for accuracy and completeness, and I agree with the above. - Ilene Qua, MD

## 2020-03-10 DIAGNOSIS — Z01818 Encounter for other preprocedural examination: Secondary | ICD-10-CM | POA: Diagnosis not present

## 2020-03-14 ENCOUNTER — Other Ambulatory Visit: Payer: Self-pay | Admitting: General Surgery

## 2020-03-14 DIAGNOSIS — N6001 Solitary cyst of right breast: Secondary | ICD-10-CM | POA: Diagnosis not present

## 2020-03-14 DIAGNOSIS — N6310 Unspecified lump in the right breast, unspecified quadrant: Secondary | ICD-10-CM | POA: Diagnosis not present

## 2020-03-14 DIAGNOSIS — L089 Local infection of the skin and subcutaneous tissue, unspecified: Secondary | ICD-10-CM | POA: Diagnosis not present

## 2020-03-29 ENCOUNTER — Ambulatory Visit (INDEPENDENT_AMBULATORY_CARE_PROVIDER_SITE_OTHER): Payer: Medicare PPO | Admitting: Family Medicine

## 2020-04-04 DIAGNOSIS — E7849 Other hyperlipidemia: Secondary | ICD-10-CM | POA: Diagnosis not present

## 2020-04-04 DIAGNOSIS — E559 Vitamin D deficiency, unspecified: Secondary | ICD-10-CM | POA: Diagnosis not present

## 2020-04-04 DIAGNOSIS — Z0000 Encounter for general adult medical examination without abnormal findings: Secondary | ICD-10-CM | POA: Diagnosis not present

## 2020-04-04 DIAGNOSIS — R7301 Impaired fasting glucose: Secondary | ICD-10-CM | POA: Diagnosis not present

## 2020-04-11 DIAGNOSIS — M179 Osteoarthritis of knee, unspecified: Secondary | ICD-10-CM | POA: Diagnosis not present

## 2020-04-11 DIAGNOSIS — R7301 Impaired fasting glucose: Secondary | ICD-10-CM | POA: Diagnosis not present

## 2020-04-11 DIAGNOSIS — J939 Pneumothorax, unspecified: Secondary | ICD-10-CM | POA: Diagnosis not present

## 2020-04-11 DIAGNOSIS — I10 Essential (primary) hypertension: Secondary | ICD-10-CM | POA: Diagnosis not present

## 2020-04-11 DIAGNOSIS — E559 Vitamin D deficiency, unspecified: Secondary | ICD-10-CM | POA: Diagnosis not present

## 2020-04-11 DIAGNOSIS — E785 Hyperlipidemia, unspecified: Secondary | ICD-10-CM | POA: Diagnosis not present

## 2020-04-11 DIAGNOSIS — R82998 Other abnormal findings in urine: Secondary | ICD-10-CM | POA: Diagnosis not present

## 2020-04-11 DIAGNOSIS — Z0000 Encounter for general adult medical examination without abnormal findings: Secondary | ICD-10-CM | POA: Diagnosis not present

## 2020-04-11 DIAGNOSIS — Z1331 Encounter for screening for depression: Secondary | ICD-10-CM | POA: Diagnosis not present

## 2020-04-12 ENCOUNTER — Encounter (INDEPENDENT_AMBULATORY_CARE_PROVIDER_SITE_OTHER): Payer: Self-pay | Admitting: Family Medicine

## 2020-04-12 ENCOUNTER — Other Ambulatory Visit: Payer: Self-pay

## 2020-04-12 ENCOUNTER — Ambulatory Visit (INDEPENDENT_AMBULATORY_CARE_PROVIDER_SITE_OTHER): Payer: Medicare PPO | Admitting: Family Medicine

## 2020-04-12 VITALS — BP 117/76 | HR 66 | Temp 98.5°F | Ht 64.0 in | Wt 165.0 lb

## 2020-04-12 DIAGNOSIS — E559 Vitamin D deficiency, unspecified: Secondary | ICD-10-CM

## 2020-04-12 DIAGNOSIS — I10 Essential (primary) hypertension: Secondary | ICD-10-CM

## 2020-04-12 DIAGNOSIS — E669 Obesity, unspecified: Secondary | ICD-10-CM | POA: Diagnosis not present

## 2020-04-12 DIAGNOSIS — Z6830 Body mass index (BMI) 30.0-30.9, adult: Secondary | ICD-10-CM

## 2020-04-13 NOTE — Progress Notes (Signed)
Chief Complaint:   OBESITY Julie Moran is here to discuss her progress with her obesity treatment plan along with follow-up of her obesity related diagnoses. Julie Moran is on keeping a food journal and adhering to recommended goals of 1200 calories and 80 grams of protein and states she is following her eating plan approximately 0% of the time. Julie Moran states she is exercising for 0 minutes 0 times per week.  Today's visit was #: 30 Starting weight: 183 lbs Starting date: 07/03/2018 Today's weight: 165 lbs Today's date: 04/12/2020 Total lbs lost to date: 18 lbs Total lbs lost since last in-office visit: 0  Interim History: Julie Moran says she had a small surgical procedure in early April.  She hasn't been tracking due to lots of indulgent, delicious food at [employer].  She was staying consistently over 1200 calories when she was tracking.  She feels that she is lacking motivation.  Subjective:   1. Essential hypertension Review: taking medications as instructed, no medication side effects noted, no chest pain on exertion, no dyspnea on exertion, no swelling of ankles.  Blood pressure is controlled today. She is taking amlodipine 25 mg daily.  BP Readings from Last 3 Encounters:  04/12/20 117/76  03/08/20 122/73  02/15/20 120/67   2. Vitamin D deficiency Julie Moran's Vitamin D level was 57.8 on 02/15/2020. She is currently taking OTC vitamin D 5000 IU each day. She denies nausea, vomiting or muscle weakness.  She endorses fatigue.  Assessment/Plan:   1. Essential hypertension Julie Moran is working on healthy weight loss and exercise to improve blood pressure control. We will watch for signs of hypotension as she continues her lifestyle modifications.  2. Vitamin D deficiency Low Vitamin D level contributes to fatigue and are associated with obesity, breast, and colon cancer. She agrees to continue to take OTC Vitamin D @5 ,000 IU daily and will follow-up for routine testing of Vitamin D, at least 2-3  times per year to avoid over-replacement.  3. Class 1 obesity with serious comorbidity and body mass index (BMI) of 30.0 to 30.9 in adult, unspecified obesity type Julie Moran is currently in the action stage of change. As such, her goal is to continue with weight loss efforts. She has agreed to keeping a food journal and adhering to recommended goals of 1150-1200 calories and 80+ grams of protein.   Exercise goals: Older adults should follow the adult guidelines. When older adults cannot meet the adult guidelines, they should be as physically active as their abilities and conditions will allow.  Julie Moran should work back up to the activity she was doing previously.  Behavioral modification strategies: increasing lean protein intake, better snacking choices and planning for success.  Julie Moran has agreed to follow-up with our clinic in 6-8 weeks. She was informed of the importance of frequent follow-up visits to maximize her success with intensive lifestyle modifications for her multiple health conditions.   Objective:   Blood pressure 117/76, pulse 66, temperature 98.5 F (36.9 C), temperature source Oral, height 5\' 4"  (1.626 m), weight 165 lb (74.8 kg), SpO2 96 %. Body mass index is 28.32 kg/m.  General: Cooperative, alert, well developed, in no acute distress. HEENT: Conjunctivae and lids unremarkable. Cardiovascular: Regular rhythm.  Lungs: Normal work of breathing. Neurologic: No focal deficits.   Lab Results  Component Value Date   CREATININE 0.91 02/15/2020   BUN 18 02/15/2020   NA 146 (H) 02/15/2020   K 4.0 02/15/2020   CL 107 (H) 02/15/2020   CO2 23 02/15/2020  Lab Results  Component Value Date   ALT 24 02/15/2020   AST 19 02/15/2020   ALKPHOS 96 02/15/2020   BILITOT 0.5 02/15/2020   Lab Results  Component Value Date   HGBA1C 5.0 02/15/2020   HGBA1C 5.1 11/03/2019   HGBA1C 5.2 10/15/2018   Lab Results  Component Value Date   INSULIN 8.8 02/15/2020   INSULIN 10.3  11/03/2019   INSULIN 13.8 10/15/2018   INSULIN 16.4 07/03/2018   Lab Results  Component Value Date   TSH 1.470 07/03/2018   Lab Results  Component Value Date   CHOL 175 11/03/2019   HDL 58 11/03/2019   LDLCALC 96 11/03/2019   TRIG 120 11/03/2019   Lab Results  Component Value Date   WBC 7.8 07/02/2012   HGB 14.4 07/02/2012   HCT 40.9 07/02/2012   MCV 85.4 07/02/2012   PLT 165 07/02/2012   Attestation Statements:   Reviewed by clinician on day of visit: allergies, medications, problem list, medical history, surgical history, family history, social history, and previous encounter notes.  Time spent on visit including pre-visit chart review and post-visit care and charting was 15 minutes.   I, Water quality scientist, CMA, am acting as transcriptionist for Coralie Common, MD.  I have reviewed the above documentation for accuracy and completeness, and I agree with the above. - Jinny Blossom, MD

## 2020-04-14 DIAGNOSIS — Z1212 Encounter for screening for malignant neoplasm of rectum: Secondary | ICD-10-CM | POA: Diagnosis not present

## 2020-04-20 ENCOUNTER — Other Ambulatory Visit: Payer: Self-pay | Admitting: Internal Medicine

## 2020-04-20 DIAGNOSIS — Z1231 Encounter for screening mammogram for malignant neoplasm of breast: Secondary | ICD-10-CM

## 2020-04-22 ENCOUNTER — Encounter: Payer: Self-pay | Admitting: Internal Medicine

## 2020-05-17 ENCOUNTER — Other Ambulatory Visit: Payer: Self-pay

## 2020-05-17 ENCOUNTER — Encounter (INDEPENDENT_AMBULATORY_CARE_PROVIDER_SITE_OTHER): Payer: Self-pay | Admitting: Family Medicine

## 2020-05-17 ENCOUNTER — Ambulatory Visit (INDEPENDENT_AMBULATORY_CARE_PROVIDER_SITE_OTHER): Payer: Medicare PPO | Admitting: Family Medicine

## 2020-05-17 VITALS — BP 127/77 | HR 53 | Temp 98.3°F | Ht 64.0 in | Wt 160.0 lb

## 2020-05-17 DIAGNOSIS — E669 Obesity, unspecified: Secondary | ICD-10-CM

## 2020-05-17 DIAGNOSIS — Z6830 Body mass index (BMI) 30.0-30.9, adult: Secondary | ICD-10-CM

## 2020-05-17 DIAGNOSIS — I10 Essential (primary) hypertension: Secondary | ICD-10-CM

## 2020-05-17 DIAGNOSIS — E7849 Other hyperlipidemia: Secondary | ICD-10-CM | POA: Diagnosis not present

## 2020-05-17 NOTE — Progress Notes (Signed)
Chief Complaint:   OBESITY Julie Moran is here to discuss her progress with her obesity treatment plan along with follow-up of her obesity related diagnoses. 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 Julie Moran states she is swimming 3 times per week, walking greater than 10,000 steps 6 days a week and strength training 30 minutes 2 times per week.  Today's visit was #: 42 Starting weight: 183 lbs Starting date: 07/03/2018 Today's weight: 160 lbs Today's date: 05/17/2020 Total lbs lost to date: 23 Total lbs lost since last in-office visit: 5  Interim History: Julie Moran reports she's been exercising quite a bit in the past 4 weeks, but has tried to be better on tracking. She does mention she has been repetitively indulgent with ice cream sometimes daily. She has just counted up and has logged almost daily, just not all day.  Subjective:   Essential hypertension. Blood pressure is well controlled. No chest pain, chest pressure, or headache. Julie Moran is on amlodipine.  BP Readings from Last 3 Encounters:  05/17/20 127/77  04/12/20 117/76  03/08/20 122/73   Lab Results  Component Value Date   CREATININE 0.91 02/15/2020   CREATININE 1.08 (H) 11/03/2019   CREATININE 1.04 (H) 06/22/2019   Other hyperlipidemia. Julie Moran is on pravastatin. No myalgias.  Lab Results  Component Value Date   CHOL 175 11/03/2019   HDL 58 11/03/2019   LDLCALC 96 11/03/2019   TRIG 120 11/03/2019   Lab Results  Component Value Date   ALT 24 02/15/2020   AST 19 02/15/2020   ALKPHOS 96 02/15/2020   BILITOT 0.5 02/15/2020   The 10-year ASCVD risk score Mikey Bussing DC Jr., et al., 2013) is: 6.7%   Values used to calculate the score:     Age: 64 years     Sex: Female     Is Non-Hispanic African American: No     Diabetic: No     Tobacco smoker: No      Systolic Blood Pressure: 103 mmHg     Is BP treated: Yes     HDL Cholesterol: 58 mg/dL     Total Cholesterol: 175 mg/dL  Assessment/Plan:   Essential hypertension. Julie Moran is working on healthy weight loss and exercise to improve blood pressure control. We will watch for signs of hypotension as she continues her lifestyle modifications. She will continue her current medication as directed.  Other hyperlipidemia.  Class 1 obesity with serious comorbidity and body mass index (BMI) of 30.0 to 30.9 in adult, unspecified obesity type - BMI greater than 30 at the start of the program.  Julie Moran is currently in the action stage of change. As such, her goal is to continue with weight loss efforts. She has agreed to keeping a food journal and adhering to recommended goals of 1150-1200 calories and 80+ grams of protein daily.   Exercise goals: Julie Moran will continue her current exercise regimen.  Behavioral modification strategies: increasing lean protein intake, meal planning and cooking strategies, keeping healthy foods in the home and planning for success.  Julie Moran has agreed to follow-up with our clinic in 4-6 weeks. She was informed of the importance of frequent follow-up visits to maximize her success with intensive lifestyle modifications for her multiple health conditions.   Objective:   Blood pressure 127/77, pulse (!) 53, temperature 98.3 F (36.8 C), temperature source  Oral, height 5\' 4"  (1.626 m), weight 160 lb (72.6 kg), SpO2 97 %. Body mass index is 27.46 kg/m.  General: Cooperative, alert, well developed, in no acute distress. HEENT: Conjunctivae and lids unremarkable. Cardiovascular: Regular rhythm.  Lungs: Normal work of breathing. Neurologic: No focal deficits.   Lab Results  Component Value Date   CREATININE 0.91 02/15/2020   BUN 18 02/15/2020   NA 146 (H) 02/15/2020   K 4.0 02/15/2020   CL 107 (H) 02/15/2020   CO2 23 02/15/2020   Lab Results  Component Value Date    ALT 24 02/15/2020   AST 19 02/15/2020   ALKPHOS 96 02/15/2020   BILITOT 0.5 02/15/2020   Lab Results  Component Value Date   HGBA1C 5.0 02/15/2020   HGBA1C 5.1 11/03/2019   HGBA1C 5.2 10/15/2018   Lab Results  Component Value Date   INSULIN 8.8 02/15/2020   INSULIN 10.3 11/03/2019   INSULIN 13.8 10/15/2018   INSULIN 16.4 07/03/2018   Lab Results  Component Value Date   TSH 1.470 07/03/2018   Lab Results  Component Value Date   CHOL 175 11/03/2019   HDL 58 11/03/2019   LDLCALC 96 11/03/2019   TRIG 120 11/03/2019   Lab Results  Component Value Date   WBC 7.8 07/02/2012   HGB 14.4 07/02/2012   HCT 40.9 07/02/2012   MCV 85.4 07/02/2012   PLT 165 07/02/2012   No results found for: IRON, TIBC, FERRITIN  Attestation Statements:   Reviewed by clinician on day of visit: allergies, medications, problem list, medical history, surgical history, family history, social history, and previous encounter notes.  Time spent on visit including pre-visit chart review and post-visit charting and care was 15 minutes.   I, Michaelene Song, am acting as transcriptionist for Coralie Common, MD   I have reviewed the above documentation for accuracy and completeness, and I agree with the above. - Jinny Blossom, MD

## 2020-05-23 ENCOUNTER — Other Ambulatory Visit: Payer: Self-pay

## 2020-05-23 ENCOUNTER — Ambulatory Visit
Admission: RE | Admit: 2020-05-23 | Discharge: 2020-05-23 | Disposition: A | Discharge status: Home / Self Care | Payer: Medicare PPO | Source: Ambulatory Visit | Attending: Internal Medicine | Admitting: Internal Medicine

## 2020-05-23 DIAGNOSIS — Z1231 Encounter for screening mammogram for malignant neoplasm of breast: Secondary | ICD-10-CM | POA: Diagnosis not present

## 2020-06-02 DIAGNOSIS — H25813 Combined forms of age-related cataract, bilateral: Secondary | ICD-10-CM | POA: Diagnosis not present

## 2020-06-02 DIAGNOSIS — H01004 Unspecified blepharitis left upper eyelid: Secondary | ICD-10-CM | POA: Diagnosis not present

## 2020-06-02 DIAGNOSIS — H524 Presbyopia: Secondary | ICD-10-CM | POA: Diagnosis not present

## 2020-06-02 DIAGNOSIS — H01001 Unspecified blepharitis right upper eyelid: Secondary | ICD-10-CM | POA: Diagnosis not present

## 2020-06-16 ENCOUNTER — Other Ambulatory Visit: Payer: Self-pay

## 2020-06-16 ENCOUNTER — Ambulatory Visit (INDEPENDENT_AMBULATORY_CARE_PROVIDER_SITE_OTHER): Payer: Medicare PPO | Admitting: Family Medicine

## 2020-06-16 ENCOUNTER — Encounter (INDEPENDENT_AMBULATORY_CARE_PROVIDER_SITE_OTHER): Payer: Self-pay | Admitting: Family Medicine

## 2020-06-16 VITALS — BP 142/71 | HR 57 | Temp 97.8°F | Ht 64.0 in | Wt 163.0 lb

## 2020-06-16 DIAGNOSIS — E8881 Metabolic syndrome: Secondary | ICD-10-CM

## 2020-06-16 DIAGNOSIS — I10 Essential (primary) hypertension: Secondary | ICD-10-CM | POA: Diagnosis not present

## 2020-06-16 DIAGNOSIS — E669 Obesity, unspecified: Secondary | ICD-10-CM | POA: Diagnosis not present

## 2020-06-16 DIAGNOSIS — Z6830 Body mass index (BMI) 30.0-30.9, adult: Secondary | ICD-10-CM

## 2020-06-21 ENCOUNTER — Other Ambulatory Visit: Payer: Self-pay

## 2020-06-21 ENCOUNTER — Ambulatory Visit (AMBULATORY_SURGERY_CENTER): Payer: Self-pay

## 2020-06-21 VITALS — Ht 64.0 in | Wt 170.0 lb

## 2020-06-21 DIAGNOSIS — Z86010 Personal history of colonic polyps: Secondary | ICD-10-CM

## 2020-06-21 MED ORDER — SUTAB 1479-225-188 MG PO TABS: 1.0000 | ORAL_TABLET | ORAL | 0 refills | Status: DC

## 2020-06-21 NOTE — Progress Notes (Signed)
Chief Complaint:   OBESITY Channel is here to discuss her progress with her obesity treatment plan along with follow-up of her obesity related diagnoses. Julie Moran is on keeping a food journal and adhering to recommended goals of 1150-1200 calories and 80+ grams of protein daily and states she is following her eating plan approximately 50% of the time. Julie Moran states she is walking, strengthening, and swimming for 3-4 hours per week.  Today's visit was #: 47 Starting weight: 183 lbs Starting date: 07/03/2018 Today's weight: 163 lbs Today's date: 06/16/2020 Total lbs lost to date: 20 Total lbs lost since last in-office visit: 0  Interim History: Julie Moran had about 3 parties for her birthday. [place] her friend has a floating doc that she got to relax on. Since last week she has had more cake and ice cream. She wants to get back on track with journaling.  Subjective:   1. Insulin resistance Julie Moran's last A1c was 5.0 and insulin 8.8. She is not on medications.  2. Essential hypertension Julie Moran's blood pressure is slightly elevated today. She denies chest pain, chest pressure, or headache. Her previous blood pressure were all within normal limits.  Assessment/Plan:   1. Insulin resistance Julie Moran will continue to work on weight loss, exercise, and decreasing simple carbohydrates to help decrease the risk of diabetes. We will repeat labs in 2 months. Julie Moran agreed to follow-up with [place] as directed to closely monitor her progress.  2. Essential hypertension Julie Moran is working on healthy weight loss and exercise to improve blood pressure control. We will watch for signs of hypotension as she continues her lifestyle modifications. We will follow up on her blood pressure at her next appointment.  3. Class 1 obesity with serious comorbidity and body mass index (BMI) of 30.0 to 30.9 in adult, unspecified obesity type Julie Moran is currently in the action stage of change. As  such, her goal is to continue with weight loss efforts. She has agreed to keeping a food journal and adhering to recommended goals of 1150-1200 calories and 80+ grams of protein daily.   Exercise goals: As is.  Behavioral modification strategies: increasing lean protein intake, meal planning and cooking strategies and keeping healthy foods in the home.  Julie Moran has agreed to follow-up with our clinic in 5 to 6 weeks. She was informed of the importance of frequent follow-up visits to maximize her success with intensive lifestyle modifications for her multiple health conditions.   Objective:   Blood pressure (!) 142/71, pulse (!) 57, temperature 97.8 F (36.6 C), temperature source Oral, height 5\' 4"  (1.626 m), weight 163 lb (73.9 kg), SpO2 97 %. Body mass index is 27.98 kg/m.  General: Cooperative, alert, well developed, in no acute distress. HEENT: Conjunctivae and lids unremarkable. Cardiovascular: Regular rhythm.  Lungs: Normal work of breathing. Neurologic: No focal deficits.   Lab Results  Component Value Date   CREATININE 0.91 02/15/2020   BUN 18 02/15/2020   NA 146 (H) 02/15/2020   K 4.0 02/15/2020   CL 107 (H) 02/15/2020   CO2 23 02/15/2020   Lab Results  Component Value Date   ALT 24 02/15/2020   AST 19 02/15/2020   ALKPHOS 96 02/15/2020   BILITOT 0.5 02/15/2020   Lab Results  Component Value Date   HGBA1C 5.0 02/15/2020   HGBA1C 5.1 11/03/2019   HGBA1C 5.2 10/15/2018   Lab Results  Component Value Date   INSULIN 8.8 02/15/2020   INSULIN 10.3  11/03/2019   INSULIN 13.8 10/15/2018   INSULIN 16.4 07/03/2018   Lab Results  Component Value Date   TSH 1.470 07/03/2018   Lab Results  Component Value Date   CHOL 175 11/03/2019   HDL 58 11/03/2019   LDLCALC 96 11/03/2019   TRIG 120 11/03/2019   Lab Results  Component Value Date   WBC 7.8 07/02/2012   HGB 14.4 07/02/2012   HCT 40.9 07/02/2012   MCV 85.4 07/02/2012   PLT 165 07/02/2012   No results  found for: IRON, TIBC, FERRITIN  Attestation Statements:   Reviewed by clinician on day of visit: allergies, medications, problem list, medical history, surgical history, family history, social history, and previous encounter notes.  Time spent on visit including pre-visit chart review and post-visit care and charting was 16 minutes.    I, Trixie Dredge, am acting as transcriptionist for Coralie Common, MD.  I have reviewed the above documentation for accuracy and completeness, and I agree with the above. - Jinny Blossom, MD

## 2020-06-21 NOTE — Progress Notes (Signed)
No egg or soy allergy known to patient  No issues with past sedation with any surgeries or procedures no intubation problems in the past  No diet pills per patient No home 02 use per patient  No blood thinners per patient  Pt denies issues with constipation  No A fib or A flutter   COVID 19 guidelines implemented in [facility] today  COVID vaccine completed on 01/2020 per pt.  Coupon given to pt in [facility] today   Due to the COVID-19 pandemic we are asking patients to follow these guidelines. Please only bring one care partner. Please be aware that your care partner may wait in the car in the parking lot or if they feel like they will be too hot to wait in the car, they may wait in the lobby on the 4th floor. All care partners are required to wear a mask the entire time (we do not have any that we can provide them), they need to practice social distancing, and we will do a Covid check for all patient's and care partners when you arrive. Also we will check their temperature and your temperature. If the care partner waits in their car they need to stay in the parking lot the entire time and we will call them on their cell phone when the patient is ready for discharge so they can bring the car to the front of the building. Also all patient's will need to wear a mask into building.

## 2020-06-22 ENCOUNTER — Encounter: Payer: Self-pay | Admitting: Internal Medicine

## 2020-06-30 DIAGNOSIS — L812 Freckles: Secondary | ICD-10-CM | POA: Diagnosis not present

## 2020-06-30 DIAGNOSIS — L853 Xerosis cutis: Secondary | ICD-10-CM | POA: Diagnosis not present

## 2020-06-30 DIAGNOSIS — L720 Epidermal cyst: Secondary | ICD-10-CM | POA: Diagnosis not present

## 2020-06-30 DIAGNOSIS — D1801 Hemangioma of skin and subcutaneous tissue: Secondary | ICD-10-CM | POA: Diagnosis not present

## 2020-06-30 DIAGNOSIS — L821 Other seborrheic keratosis: Secondary | ICD-10-CM | POA: Diagnosis not present

## 2020-07-05 ENCOUNTER — Encounter: Payer: Self-pay | Admitting: Internal Medicine

## 2020-07-05 ENCOUNTER — Other Ambulatory Visit: Payer: Self-pay

## 2020-07-05 ENCOUNTER — Ambulatory Visit (AMBULATORY_SURGERY_CENTER): Payer: Medicare PPO | Admitting: Internal Medicine

## 2020-07-05 VITALS — BP 148/74 | HR 63 | Temp 98.6°F | Resp 12 | Ht 64.0 in | Wt 170.0 lb

## 2020-07-05 DIAGNOSIS — I10 Essential (primary) hypertension: Secondary | ICD-10-CM | POA: Diagnosis not present

## 2020-07-05 DIAGNOSIS — D122 Benign neoplasm of ascending colon: Secondary | ICD-10-CM

## 2020-07-05 DIAGNOSIS — Z86010 Personal history of colonic polyps: Secondary | ICD-10-CM | POA: Diagnosis not present

## 2020-07-05 DIAGNOSIS — E669 Obesity, unspecified: Secondary | ICD-10-CM | POA: Diagnosis not present

## 2020-07-05 MED ORDER — SODIUM CHLORIDE 0.9 % IV SOLN: 500.0000 mL | Freq: Once | INTRAVENOUS | Status: DC

## 2020-07-05 NOTE — Op Note (Signed)
[facility] Patient Name: Julie Moran Procedure Date: 07/05/2020 11:03 AM MRN: [id number] Endoscopist: Docia Chuck. Henrene Pastor , MD Age: 64 Referring MD:  Date of Birth: [birth year] Gender: Female Account #: [id number] Procedure:                Colonoscopy with cold snare polypectomy x 2 Indications:              High risk colon cancer surveillance: Personal                            history of non-advanced adenoma, High risk colon                            cancer surveillance: Personal history of sessile                            serrated colon polyp (less than 10 mm in size) with                            no dysplasia. Previous examinations 2008, 2013, 2016 Medicines:                Monitored Anesthesia Care Procedure:                Pre-Anesthesia Assessment:                           - Prior to the procedure, a History and Physical                            was performed, and patient medications and                            allergies were reviewed. The patient's tolerance of                            previous anesthesia was also reviewed. The risks                            and benefits of the procedure and the sedation                            options and risks were discussed with the patient.                            All questions were answered, and informed consent                            was obtained. Prior Anticoagulants: The patient has                            taken no previous anticoagulant or antiplatelet                            agents. ASA Grade Assessment: II - A patient with  mild systemic disease. After reviewing the risks                            and benefits, the patient was deemed in                            satisfactory condition to undergo the procedure.                           After obtaining informed consent, the colonoscope                            was passed under direct vision. Throughout the                             procedure, the patient's blood pressure, pulse, and                            oxygen saturations were monitored continuously. The                            Colonoscope was introduced through the anus and                            advanced to the the cecum, identified by                            appendiceal orifice and ileocecal valve. The                            ileocecal valve, appendiceal orifice, and rectum                            were photographed. The quality of the bowel                            preparation was excellent. The colonoscopy was                            performed without difficulty. The patient tolerated                            the procedure well. The bowel preparation used was                            SUPREP via split dose instruction. Scope In: 11:16:48 AM Scope Out: 11:35:18 AM Scope Withdrawal Time: 0 hours 11 minutes 27 seconds  Total Procedure Duration: 0 hours 18 minutes 30 seconds  Findings:                 Two polyps were found in the ascending colon. The                            polyps were 2 to 5 mm in size. These polyps  were                            removed with a cold snare. Resection and retrieval                            were complete.                           The exam was otherwise without abnormality on                            direct and retroflexion views. Complications:            No immediate complications. Estimated blood loss:                            None. Estimated Blood Loss:     Estimated blood loss: none. Impression:               - Two 2 to 5 mm polyps in the ascending colon,                            removed with a cold snare. Resected and retrieved.                           - The examination was otherwise normal on direct                            and retroflexion views. Recommendation:           - Repeat colonoscopy in 5-10 years for surveillance.                           - Patient has a  contact number available for                            emergencies. The signs and symptoms of potential                            delayed complications were discussed with the                            patient. Return to normal activities tomorrow.                            Written discharge instructions were provided to the                            patient.                           - Resume previous diet.                           - Continue present medications.                           -  Await pathology results. Docia Chuck. Henrene Pastor, MD 07/05/2020 11:53:17 AM This report has been signed electronically.

## 2020-07-05 NOTE — Progress Notes (Signed)
Called to room to assist during endoscopic procedure.  Patient ID and intended procedure confirmed with present staff. Received instructions for my participation in the procedure from the performing physician.  

## 2020-07-05 NOTE — Patient Instructions (Signed)
Handouts Provided:  Polyps  YOU HAD AN ENDOSCOPIC PROCEDURE TODAY AT THE [facility]:   Refer to the procedure report that was given to you for any specific questions about what was found during the examination.  If the procedure report does not answer your questions, please call your gastroenterologist to clarify.  If you requested that your care partner not be given the details of your procedure findings, then the procedure report has been included in a sealed envelope for you to review at your convenience later.  YOU SHOULD EXPECT: Some feelings of bloating in the abdomen. Passage of more gas than usual.  Walking can help get rid of the air that was put into your GI tract during the procedure and reduce the bloating. If you had a lower endoscopy (such as a colonoscopy or flexible sigmoidoscopy) you may notice spotting of blood in your stool or on the toilet paper. If you underwent a bowel prep for your procedure, you may not have a normal bowel movement for a few days.  Please Note:  You might notice some irritation and congestion in your nose or some drainage.  This is from the oxygen used during your procedure.  There is no need for concern and it should clear up in a day or so.  SYMPTOMS TO REPORT IMMEDIATELY:   Following lower endoscopy (colonoscopy or flexible sigmoidoscopy):  Excessive amounts of blood in the stool  Significant tenderness or worsening of abdominal pains  Swelling of the abdomen that is new, acute  Fever of 100F or higher  For urgent or emergent issues, a gastroenterologist can be reached at any hour by calling [phone]. Do not use MyChart messaging for urgent concerns.    DIET:  We do recommend a small meal at first, but then you may proceed to your regular diet.  Drink plenty of fluids but you should avoid alcoholic beverages for 24 hours.  ACTIVITY:  You should plan to take it easy for the rest of today and you should NOT DRIVE or use heavy  machinery until tomorrow (because of the sedation medicines used during the test).    FOLLOW UP: Our staff will call the number listed on your records 48-72 hours following your procedure to check on you and address any questions or concerns that you may have regarding the information given to you following your procedure. If we do not reach you, we will leave a message.  We will attempt to reach you two times.  During this call, we will ask if you have developed any symptoms of COVID 19. If you develop any symptoms (ie: fever, flu-like symptoms, shortness of breath, cough etc.) before then, please call [phone].  If you test positive for Covid 19 in the 2 weeks post procedure, please call and report this information to us.    If any biopsies were taken you will be contacted by phone or by letter within the next 1-3 weeks.  Please call us at [phone] if you have not heard about the biopsies in 3 weeks.    SIGNATURES/CONFIDENTIALITY: You and/or your care partner have signed paperwork which will be entered into your electronic medical record.  These signatures attest to the fact that that the information above on your After Visit Summary has been reviewed and is understood.  Full responsibility of the confidentiality of this discharge information lies with you and/or your care-partner.  

## 2020-07-05 NOTE — Progress Notes (Signed)
Report to PACU, RN, vss, BBS= Clear.  

## 2020-07-05 NOTE — Progress Notes (Signed)
Pt's states no medical or surgical changes since previsit or office visit.   V/S-CW  Check-in-JB 

## 2020-07-05 NOTE — Progress Notes (Signed)
HR dropped into 30s with about a 5 sec pause.  Procedure paused and colon air suctioned, Robinul given.  Dr Henrene Pastor aware

## 2020-07-07 ENCOUNTER — Encounter: Payer: Self-pay | Admitting: Internal Medicine

## 2020-07-07 ENCOUNTER — Telehealth: Payer: Self-pay

## 2020-07-07 NOTE — Telephone Encounter (Signed)
  Follow up Call-  Call back number 07/05/2020  Post procedure Call Back phone  # [phone]  Permission to leave phone message Yes  Some recent data might be hidden     Patient questions:  Do you have a fever, pain , or abdominal swelling? No. Pain Score  0 *  Have you tolerated food without any problems? Yes.    Have you been able to return to your normal activities? Yes.    Do you have any questions about your discharge instructions: Diet   No. Medications  No. Follow up visit  No.  Do you have questions or concerns about your Care? No.  Actions: * If pain score is 4 or above: No action needed, pain <4.   1. Have you developed a fever since your procedure? No   2.   Have you had an respiratory symptoms (SOB or cough) since your procedure? No   3.   Have you tested positive for COVID 19 since your procedure? No   4.   Have you had any family members/close contacts diagnosed with the COVID 19 since your procedure?  No    If yes to any of these questions please route to Joylene John, RN and Erenest Rasher, RN

## 2020-07-27 ENCOUNTER — Other Ambulatory Visit: Payer: Self-pay

## 2020-07-27 ENCOUNTER — Ambulatory Visit (INDEPENDENT_AMBULATORY_CARE_PROVIDER_SITE_OTHER): Payer: Medicare PPO | Admitting: Family Medicine

## 2020-07-27 ENCOUNTER — Encounter (INDEPENDENT_AMBULATORY_CARE_PROVIDER_SITE_OTHER): Payer: Self-pay | Admitting: Family Medicine

## 2020-07-27 VITALS — BP 115/76 | HR 59 | Temp 98.1°F | Ht 64.0 in | Wt 163.0 lb

## 2020-07-27 DIAGNOSIS — E669 Obesity, unspecified: Secondary | ICD-10-CM | POA: Diagnosis not present

## 2020-07-27 DIAGNOSIS — Z6830 Body mass index (BMI) 30.0-30.9, adult: Secondary | ICD-10-CM

## 2020-07-27 DIAGNOSIS — E8881 Metabolic syndrome: Secondary | ICD-10-CM | POA: Diagnosis not present

## 2020-07-27 DIAGNOSIS — E559 Vitamin D deficiency, unspecified: Secondary | ICD-10-CM | POA: Diagnosis not present

## 2020-07-27 DIAGNOSIS — I10 Essential (primary) hypertension: Secondary | ICD-10-CM | POA: Diagnosis not present

## 2020-07-27 NOTE — Progress Notes (Signed)
Chief Complaint:   OBESITY Julie Moran is here to discuss her progress with her obesity treatment plan along with follow-up of her obesity related diagnoses. Donyea is on keeping a food journal and adhering to recommended goals of 1150-1200 calories and 80+ grams of protein and states she is following her eating plan approximately 50% of the time. Analeya states she is walking for 40 minutes 5 times per week.  Today's visit was #: 89 Starting weight: 183 lbs Starting date: 07/03/2018 Today's weight: 163 lbs Today's date: 07/27/2020 Total lbs lost to date: 20 lbs Total lbs lost since last in-office visit: 0  Interim History: Kiani says she feels discouraged.  She feels like she is following the plan but not losing wt.  In reality, she is not journaling her foods and is unaware of her meals protein value and calories.  She lives at [place] and they cook for her.  She eats 1 meal per day there at least most days.  She says she usually has lunch at [employer]  For breakfast, she will have a protein shake with 50 grams of protein and 359 calories.  She is not sure about lunch or dinner as she does not weigh her foods and doesn't know amounts she is eating..  Subjective:   1. Essential hypertension Review: taking medications as instructed, no medication side effects noted, no chest pain on exertion, no dyspnea on exertion, no swelling of ankles.  Her blood pressure at her last office visit was 142/71.  It is much better today.  BP Readings from Last 3 Encounters:  07/27/20 115/76  07/05/20 (!) 148/74  06/16/20 (!) 142/71   2. Insulin resistance Kalya has a diagnosis of insulin resistance based on her elevated fasting insulin level >5. She continues to work on diet and exercise to decrease her risk of diabetes.  Lab Results  Component Value Date   INSULIN 8.8 02/15/2020   INSULIN 10.3 11/03/2019   INSULIN 13.8 10/15/2018   INSULIN 16.4 07/03/2018   Lab Results  Component Value Date    HGBA1C 5.0 02/15/2020   3. Vitamin D deficiency Gladyse's Vitamin D level was 57.8 on 02/15/2020. She is currently taking OTC vitamin D 1000 IU each day. She denies nausea, vomiting or muscle weakness.  Assessment/Plan:   1. Essential hypertension Henriette is working on healthy weight loss and exercise to improve blood pressure control. We will watch for signs of hypotension as she continues her lifestyle modifications.  Continue Norvasc per PCP, close monitoring, weight loss.  Continue prudent nutritional plan and low salt diet.  2. Insulin resistance Shannara will continue to work on weight loss, exercise, and decreasing simple carbohydrates to help decrease the risk of diabetes. Uriel agreed to follow-up with [place] as directed to closely monitor her progress.  3. Vitamin D deficiency Low Vitamin D level contributes to fatigue and are associated with obesity, breast, and colon cancer. She agrees to continue to take OTC Vitamin D @1 ,000 IU daily and will follow-up for routine testing of Vitamin D, at least 2-3 times per year to avoid over-replacement.  4. Class 1 obesity with serious comorbidity and body mass index (BMI) of 30.0 to 30.9 in adult, unspecified obesity type Nataly is currently in the action stage of change. As such, her goal is to continue with weight loss efforts. She has agreed to keeping a food journal and adhering to recommended goals of 1150-1200 calories and 80 grams of protein. She knows she will need to  weigh her foods and calculate calories etc.  Exercise goals: As is.  Behavioral modification strategies: increasing lean protein intake, decreasing eating out, no skipping meals and keeping a strict food journal.  Malaiah has agreed to follow-up with our clinic in 3 weeks. She was informed of the importance of frequent follow-up visits to maximize her success with intensive lifestyle modifications for her multiple health conditions.   Objective:   Blood pressure 115/76,  pulse (!) 59, temperature 98.1 F (36.7 C), height 5\' 4"  (1.626 m), weight 163 lb (73.9 kg), SpO2 96 %. Body mass index is 27.98 kg/m.  General: Cooperative, alert, well developed, in no acute distress. HEENT: Conjunctivae and lids unremarkable. Cardiovascular: Regular rhythm.  Lungs: Normal work of breathing. Neurologic: No focal deficits.   Lab Results  Component Value Date   CREATININE 0.91 02/15/2020   BUN 18 02/15/2020   NA 146 (H) 02/15/2020   K 4.0 02/15/2020   CL 107 (H) 02/15/2020   CO2 23 02/15/2020   Lab Results  Component Value Date   ALT 24 02/15/2020   AST 19 02/15/2020   ALKPHOS 96 02/15/2020   BILITOT 0.5 02/15/2020   Lab Results  Component Value Date   HGBA1C 5.0 02/15/2020   HGBA1C 5.1 11/03/2019   HGBA1C 5.2 10/15/2018   Lab Results  Component Value Date   INSULIN 8.8 02/15/2020   INSULIN 10.3 11/03/2019   INSULIN 13.8 10/15/2018   INSULIN 16.4 07/03/2018   Lab Results  Component Value Date   TSH 1.470 07/03/2018   Lab Results  Component Value Date   CHOL 175 11/03/2019   HDL 58 11/03/2019   LDLCALC 96 11/03/2019   TRIG 120 11/03/2019   Lab Results  Component Value Date   WBC 7.8 07/02/2012   HGB 14.4 07/02/2012   HCT 40.9 07/02/2012   MCV 85.4 07/02/2012   PLT 165 07/02/2012   Obesity Behavioral Intervention Documentation for Insurance:   Approximately 15 minutes were spent on the discussion below.  ASK: We discussed the diagnosis of obesity with Judeen Hammans today and Mersadez agreed to give [place] permission to discuss obesity behavioral modification therapy today.  ASSESS: Isra has the diagnosis of obesity and her BMI today is 28.0. Zailyn is in the action stage of change.   ADVISE: Trent was educated on the multiple health risks of obesity as well as the benefit of weight loss to improve her health. She was advised of the need for long term treatment and the importance of lifestyle modifications to improve her current health and to  decrease her risk of future health problems.  AGREE: Multiple dietary modification options and treatment options were discussed and Retta agreed to follow the recommendations documented in the above note.  ARRANGE: Denean was educated on the importance of frequent visits to treat obesity as outlined per CMS and USPSTF guidelines and agreed to schedule her next follow up appointment today.  Attestation Statements:   Reviewed by clinician on day of visit: allergies, medications, problem list, medical history, surgical history, family history, social history, and previous encounter notes.  I, Water quality scientist, CMA, am acting as Location manager for [employer], DO.  I have reviewed the above documentation for accuracy and completeness, and I agree with the above. Mellody Dance, DO

## 2020-07-28 ENCOUNTER — Ambulatory Visit (INDEPENDENT_AMBULATORY_CARE_PROVIDER_SITE_OTHER): Payer: Medicare PPO | Admitting: Family Medicine

## 2020-08-24 ENCOUNTER — Other Ambulatory Visit: Payer: Self-pay

## 2020-08-24 ENCOUNTER — Encounter (INDEPENDENT_AMBULATORY_CARE_PROVIDER_SITE_OTHER): Payer: Self-pay | Admitting: Family Medicine

## 2020-08-24 ENCOUNTER — Ambulatory Visit (INDEPENDENT_AMBULATORY_CARE_PROVIDER_SITE_OTHER): Payer: Medicare PPO | Admitting: Family Medicine

## 2020-08-24 VITALS — BP 126/78 | HR 61 | Temp 98.3°F | Ht 64.0 in | Wt 161.0 lb

## 2020-08-24 DIAGNOSIS — E669 Obesity, unspecified: Secondary | ICD-10-CM

## 2020-08-24 DIAGNOSIS — Z6830 Body mass index (BMI) 30.0-30.9, adult: Secondary | ICD-10-CM | POA: Diagnosis not present

## 2020-08-24 DIAGNOSIS — E559 Vitamin D deficiency, unspecified: Secondary | ICD-10-CM

## 2020-08-24 DIAGNOSIS — E7849 Other hyperlipidemia: Secondary | ICD-10-CM | POA: Diagnosis not present

## 2020-08-24 DIAGNOSIS — E8881 Metabolic syndrome: Secondary | ICD-10-CM

## 2020-08-24 DIAGNOSIS — Z9189 Other specified personal risk factors, not elsewhere classified: Secondary | ICD-10-CM | POA: Diagnosis not present

## 2020-08-24 DIAGNOSIS — I10 Essential (primary) hypertension: Secondary | ICD-10-CM | POA: Diagnosis not present

## 2020-08-25 LAB — VITAMIN D 25 HYDROXY (VIT D DEFICIENCY, FRACTURES): Vit D, 25-Hydroxy: 46.1 ng/mL (ref 30.0–100.0)

## 2020-08-25 LAB — INSULIN, RANDOM: INSULIN: 11.3 u[IU]/mL (ref 2.6–24.9)

## 2020-08-25 LAB — HEMOGLOBIN A1C
Est. average glucose Bld gHb Est-mCnc: 100 mg/dL
Hgb A1c MFr Bld: 5.1 % (ref 4.8–5.6)

## 2020-08-26 ENCOUNTER — Encounter (INDEPENDENT_AMBULATORY_CARE_PROVIDER_SITE_OTHER): Payer: Self-pay | Admitting: Family Medicine

## 2020-08-29 NOTE — Telephone Encounter (Signed)
Please review and advise.

## 2020-08-29 NOTE — Progress Notes (Signed)
Chief Complaint:   OBESITY Julie Moran is here to discuss her progress with her obesity treatment plan along with follow-up of her obesity related diagnoses. Julie Moran is on keeping a food journal and adhering to recommended goals of 1150-1200 calories and 80 grams of protein and states she is following her eating plan approximately 75% of the time. Julie Moran states she is walking for 120 minutes 1 time per week.  Today's visit was #: 35 Starting weight: 183 lbs Starting date: 07/03/2018 Today's weight: 161 lbs Today's date: 08/24/2020 Total lbs lost to date: 22 lbs Total lbs lost since last in-office visit: 2 lbs  Interim History: Julie Moran and I spoke last time about trying to track more.  She is eating less dessert at [place] now as well, as per our discussion.  She is tracking 75% of the time and when she does not track, she does PC/[employer].  Occasionally goes over in calories, but that is rare.  Averages 2.5 hour per week of walking outside.  Subjective:   1. Insulin resistance Julie Moran has a diagnosis of insulin resistance based on her elevated fasting insulin level >5. She continues to work on diet and exercise to decrease her risk of diabetes.  Lab Results  Component Value Date   INSULIN 11.3 08/24/2020   INSULIN 8.8 02/15/2020   INSULIN 10.3 11/03/2019   INSULIN 13.8 10/15/2018   INSULIN 16.4 07/03/2018   Lab Results  Component Value Date   HGBA1C 5.1 08/24/2020   2. Essential hypertension Review: taking medications as instructed, no medication side effects noted, no chest pain on exertion, no dyspnea on exertion, no swelling of ankles.  She is taking Norvasc.   BP Readings from Last 3 Encounters:  08/24/20 126/78  07/27/20 115/76  07/05/20 (!) 148/74   3. Vitamin D deficiency Julie Moran's Vitamin D level was 57.8 on 02/15/2020. She is currently taking OTC vitamin D 1,000 IU each day. She denies nausea, vomiting or muscle weakness.  4. Other hyperlipidemia Julie Moran has hyperlipidemia  and has been trying to improve her cholesterol levels with intensive lifestyle modification including a low saturated fat diet, exercise and weight loss. She denies any chest pain, claudication or myalgias.  She is taking Pravachol daily.  Lab Results  Component Value Date   ALT 24 02/15/2020   AST 19 02/15/2020   ALKPHOS 96 02/15/2020   BILITOT 0.5 02/15/2020   Lab Results  Component Value Date   CHOL 175 11/03/2019   HDL 58 11/03/2019   LDLCALC 96 11/03/2019   TRIG 120 11/03/2019   5. At risk for osteoporosis Julie Moran was given approximately 16 minutes of osteoporosis prevention counseling today.   Julie Moran is at risk for osteopenia and osteoporosis due to Vitamin D deficiency, as well as other risk factors.  We discussed the importance of prudent screenings through her PCP's office for prevention.     Julie Moran was encouraged to take her Vitamin D and follow her calcium rich diet.  We will continue to monitor vitamin D levels to ensure treatment is appropriate.   It is recommended that she eventually engage in weight bearing exercises and muscle strengthening exercises to help improve bone density and decrease her risk of osteopenia and osteoporosis.  Assessment/Plan:   1. Insulin resistance Julie Moran will continue to work on weight loss, exercise, and decreasing simple carbohydrates to help decrease the risk of diabetes. Julie Moran agreed to follow-up with [place] as directed to closely monitor her progress.  - Insulin, random - Hemoglobin A1c  2. Essential hypertension Julie Moran is working on healthy weight loss and exercise to improve blood pressure control. We will watch for signs of hypotension as she continues her lifestyle modifications.  Blood pressure is at goal.  3. Vitamin D deficiency Low Vitamin D level contributes to fatigue and are associated with obesity, breast, and colon cancer. She agrees to continue to take OTC Vitamin D @1 ,000 IU daily and will follow-up for routine testing of  Vitamin D, at least 2-3 times per year to avoid over-replacement.  - VITAMIN D 25 Hydroxy (Vit-D Deficiency, Fractures)  4. Other hyperlipidemia Cardiovascular risk and specific lipid/LDL goals reviewed.  We discussed several lifestyle modifications today and Julie Moran will continue to work on diet, exercise and weight loss efforts. Orders and follow up as documented in patient record.   Counseling Intensive lifestyle modifications are the first line treatment for this issue. . Dietary changes: Increase soluble fiber. Decrease simple carbohydrates. . Exercise changes: Moderate to vigorous-intensity aerobic activity 150 minutes per week if tolerated. . Lipid-lowering medications: see documented in medical record.  5. At risk for osteoporosis Julie Moran was given approximately 16 minutes of osteoporosis prevention counseling today.   Julie Moran is at risk for osteopenia and osteoporosis due to Vitamin D deficiency, as well as other risk factors.  We discussed the importance of prudent screenings through her PCP's office for prevention.     Julie Moran was encouraged to take her Vitamin D and follow her calcium rich diet.  We will continue to monitor vitamin D levels to ensure treatment is appropriate.   It is recommended that she eventually engage in weight bearing exercises and muscle strengthening exercises to help improve bone density and decrease her risk of osteopenia and osteoporosis.  6. Class 1 obesity with serious comorbidity and body mass index (BMI) of 30.0 to 30.9 in adult, unspecified obesity type Julie Moran is currently in the action stage of change. As such, her goal is to continue with weight loss efforts. She has agreed to keeping a food journal and adhering to recommended goals of 1150-1200 calories and 80 grams of protein.   Exercise goals: For substantial health benefits, adults should do at least 150 minutes (2 hours and 30 minutes) a week of moderate-intensity, or 75 minutes (1 hour and 15  minutes) a week of vigorous-intensity aerobic physical activity, or an equivalent combination of moderate- and vigorous-intensity aerobic activity. Aerobic activity should be performed in episodes of at least 10 minutes, and preferably, it should be spread throughout the week. Add 2 days of strength training per week.  Behavioral modification strategies: increasing lean protein intake and keeping a strict food journal (tracking).  Analeya has agreed to follow-up with our clinic in 2 weeks. She was informed of the importance of frequent follow-up visits to maximize her success with intensive lifestyle modifications for her multiple health conditions.   Ainslee was informed we would discuss her lab results at her next visit unless there is a critical issue that needs to be addressed sooner. Rendy agreed to keep her next visit at the agreed upon time to discuss these results.  Objective:   Blood pressure 126/78, pulse 61, temperature 98.3 F (36.8 C), height 5\' 4"  (1.626 m), weight 161 lb (73 kg), SpO2 97 %. Body mass index is 27.64 kg/m.  General: Cooperative, alert, well developed, in no acute distress. HEENT: Conjunctivae and lids unremarkable. Cardiovascular: Regular rhythm.  Lungs: Normal work of breathing. Neurologic: No focal deficits.   Lab Results  Component Value Date  CREATININE 0.91 02/15/2020   BUN 18 02/15/2020   NA 146 (H) 02/15/2020   K 4.0 02/15/2020   CL 107 (H) 02/15/2020   CO2 23 02/15/2020   Lab Results  Component Value Date   ALT 24 02/15/2020   AST 19 02/15/2020   ALKPHOS 96 02/15/2020   BILITOT 0.5 02/15/2020   Lab Results  Component Value Date   HGBA1C 5.1 08/24/2020   HGBA1C 5.0 02/15/2020   HGBA1C 5.1 11/03/2019   HGBA1C 5.2 10/15/2018   Lab Results  Component Value Date   INSULIN 11.3 08/24/2020   INSULIN 8.8 02/15/2020   INSULIN 10.3 11/03/2019   INSULIN 13.8 10/15/2018   INSULIN 16.4 07/03/2018   Lab Results  Component Value Date   TSH  1.470 07/03/2018   Lab Results  Component Value Date   CHOL 175 11/03/2019   HDL 58 11/03/2019   LDLCALC 96 11/03/2019   TRIG 120 11/03/2019   Lab Results  Component Value Date   WBC 7.8 07/02/2012   HGB 14.4 07/02/2012   HCT 40.9 07/02/2012   MCV 85.4 07/02/2012   PLT 165 07/02/2012   Attestation Statements:   Reviewed by clinician on day of visit: allergies, medications, problem list, medical history, surgical history, family history, social history, and previous encounter notes.  I, Water quality scientist, CMA, am acting as Location manager for [employer], DO.  I have reviewed the above documentation for accuracy and completeness, and I agree with the above. Mellody Dance, DO

## 2020-09-12 DIAGNOSIS — H00015 Hordeolum externum left lower eyelid: Secondary | ICD-10-CM | POA: Diagnosis not present

## 2020-09-14 ENCOUNTER — Encounter (INDEPENDENT_AMBULATORY_CARE_PROVIDER_SITE_OTHER): Payer: Self-pay | Admitting: Family Medicine

## 2020-09-14 ENCOUNTER — Other Ambulatory Visit: Payer: Self-pay

## 2020-09-14 ENCOUNTER — Ambulatory Visit (INDEPENDENT_AMBULATORY_CARE_PROVIDER_SITE_OTHER): Payer: Medicare PPO | Admitting: Family Medicine

## 2020-09-14 VITALS — BP 112/71 | HR 65 | Temp 96.7°F | Ht 64.0 in | Wt 159.0 lb

## 2020-09-14 DIAGNOSIS — E8881 Metabolic syndrome: Secondary | ICD-10-CM

## 2020-09-14 DIAGNOSIS — E559 Vitamin D deficiency, unspecified: Secondary | ICD-10-CM | POA: Diagnosis not present

## 2020-09-14 DIAGNOSIS — Z6830 Body mass index (BMI) 30.0-30.9, adult: Secondary | ICD-10-CM | POA: Diagnosis not present

## 2020-09-14 DIAGNOSIS — I10 Essential (primary) hypertension: Secondary | ICD-10-CM | POA: Diagnosis not present

## 2020-09-14 DIAGNOSIS — E669 Obesity, unspecified: Secondary | ICD-10-CM

## 2020-09-14 DIAGNOSIS — E7849 Other hyperlipidemia: Secondary | ICD-10-CM | POA: Diagnosis not present

## 2020-09-14 MED ORDER — VITAMIN D (ERGOCALCIFEROL) 1.25 MG (50000 UNIT) PO CAPS: 50000.0000 [IU] | ORAL_CAPSULE | ORAL | 0 refills | Status: DC

## 2020-09-14 MED ORDER — VITAMIN D3 125 MCG (5000 UT) PO CAPS: 1.0000 | ORAL_CAPSULE | Freq: Every day | ORAL | 0 refills | Status: DC

## 2020-09-15 NOTE — Progress Notes (Signed)
Chief Complaint:   OBESITY Julie Moran is here to discuss her progress with her obesity treatment plan along with follow-up of her obesity related diagnoses. Zia is on keeping a food journal and adhering to recommended goals of 1150-1200 calories and 80 grams of protein and states she is following her eating plan approximately 75% of the time. Sarena states she is walking and lifting weights for 300 minutes 5-6 times per week.  Today's visit was #: 6 Starting weight: 183 lbs Starting date: 07/03/2018 Today's weight: 159 lbs Today's date: 09/14/2020 Total lbs lost to date: 24 lbs Total lbs lost since last in-office visit: 2 lbs   Interim History:  Esthefany is journaling for 75% of the time, hitting over 80 grams of protein most days.  She is cutting back on desserts.  They were at [facility] and had a chef for 4 days.  She overate then, but ws more active during that time.  The food was very healthy as well.  She is here to review recent labs as well.    Assessment/Plan:    1. Essential hypertension Discussed labs with patient today.  Joy is taking Norvasc 2.5 mg daily and is asymptomatic.  No chest pain, shortness of breath on exertion, or lower extremity edema.   Plan:  Blood pressure is at goal.  Continue home blood pressure monitoring.  Continue dietary and lifestyle mod and current medications.    2. Other hyperlipidemia She is taking Pravachol 20 mg daily.  Cholesterol is at goal.  We made no change in management/dose.   Plan:  Continue medications and prudent nutritional plan.  Counseling done  Lab Results  Component Value Date   ALT 24 02/15/2020   AST 19 02/15/2020   ALKPHOS 96 02/15/2020   BILITOT 0.5 02/15/2020   Lab Results  Component Value Date   CHOL 175 11/03/2019   HDL 58 11/03/2019   LDLCALC 96 11/03/2019   TRIG 120 11/03/2019     3. Vitamin D deficiency Discussed labs with patient today.  Current vitamin D is 46.1, tested on  08/24/2020  Plan:   Vit D not at goal- Optimal goal > 50 ng/dL.   There is also evidence to support a goal of >70 ng/dL in patients with cancer and heart disease.   - ADD once wkly vit D to her daily OTC vit D supp.  -  follow-up for routine testing of Vitamin D at least 2-3 times per year to avoid over-replacement.  Meds ordered this encounter  Medications  . Vitamin D, Ergocalciferol, (DRISDOL) 1.25 MG (50000 UNIT) CAPS capsule    Sig: Take 1 capsule (50,000 Units total) by mouth every 7 (seven) days.    Dispense:  4 capsule    Refill:  0  . DISCONTD: Cholecalciferol (VITAMIN D3) 125 MCG (5000 UT) CAPS    Sig: Take 1 capsule (5,000 Units total) by mouth daily. Pt actually has been taking 1,000 IU qd    Dispense:  30 capsule    Refill:  0     4. Insulin resistance Discussed labs with patient today.  Fasting insulin level went from 8.8 to 11.3 recently.  A1c is within normal limits.   Plan: Reviewed labs w pt today.  Continue to decrease simple carbs and focus on eating only complex carbs and increasing fiber and protein.  Lab Results  Component Value Date   INSULIN 11.3 08/24/2020   INSULIN 8.8 02/15/2020   INSULIN 10.3 11/03/2019  INSULIN 13.8 10/15/2018   INSULIN 16.4 07/03/2018   Lab Results  Component Value Date   HGBA1C 5.1 08/24/2020     5. Class 1 obesity with serious comorbidity and body mass index (BMI) of 30.0 to 30.9 in adult, unspecified obesity type  Nakema is currently in the action stage of change. As such, her goal is to continue with weight loss efforts. She has agreed to keeping a food journal and adhering to recommended goals of 1200 calories and 90+ grams of protein.   Exercise goals: As is.  Behavioral modification strategies: decreasing simple carbohydrates.  Kristell has agreed to follow-up with our clinic in 3 weeks. She was informed of the importance of frequent follow-up visits to maximize her success with intensive lifestyle modifications  for her multiple health conditions.     Objective:   Blood pressure 112/71, pulse 65, temperature (!) 96.7 F (35.9 C), height 5\' 4"  (1.626 m), weight 159 lb (72.1 kg), SpO2 96 %. Body mass index is 27.29 kg/m.  General: Cooperative, alert, well developed, in no acute distress. HEENT: Conjunctivae and lids unremarkable. Cardiovascular: Regular rhythm.  Lungs: Normal work of breathing. Neurologic: No focal deficits.   Lab Results  Component Value Date   CREATININE 0.91 02/15/2020   BUN 18 02/15/2020   NA 146 (H) 02/15/2020   K 4.0 02/15/2020   CL 107 (H) 02/15/2020   CO2 23 02/15/2020   Lab Results  Component Value Date   ALT 24 02/15/2020   AST 19 02/15/2020   ALKPHOS 96 02/15/2020   BILITOT 0.5 02/15/2020   Lab Results  Component Value Date   HGBA1C 5.1 08/24/2020   HGBA1C 5.0 02/15/2020   HGBA1C 5.1 11/03/2019   HGBA1C 5.2 10/15/2018   Lab Results  Component Value Date   INSULIN 11.3 08/24/2020   INSULIN 8.8 02/15/2020   INSULIN 10.3 11/03/2019   INSULIN 13.8 10/15/2018   INSULIN 16.4 07/03/2018   Lab Results  Component Value Date   TSH 1.470 07/03/2018   Lab Results  Component Value Date   CHOL 175 11/03/2019   HDL 58 11/03/2019   LDLCALC 96 11/03/2019   TRIG 120 11/03/2019   Lab Results  Component Value Date   WBC 7.8 07/02/2012   HGB 14.4 07/02/2012   HCT 40.9 07/02/2012   MCV 85.4 07/02/2012   PLT 165 07/02/2012     Obesity Behavioral Intervention:   Approximately 15 minutes were spent on the discussion below.  ASK: We discussed the diagnosis of obesity with Judeen Hammans today and Adel agreed to give [place] permission to discuss obesity behavioral modification therapy today.  ASSESS: Jenin has the diagnosis of obesity and her BMI today is 27.3. Nayvie is in the action stage of change.   ADVISE: Levy was educated on the multiple health risks of obesity as well as the benefit of weight loss to improve her health. She was advised of the  need for long term treatment and the importance of lifestyle modifications to improve her current health and to decrease her risk of future health problems.  AGREE: Multiple dietary modification options and treatment options were discussed and Marrietta agreed to follow the recommendations documented in the above note.  ARRANGE: Amit was educated on the importance of frequent visits to treat obesity as outlined per CMS and USPSTF guidelines and agreed to schedule her next follow up appointment today.  Attestation Statements:   Reviewed by clinician on day of visit: allergies, medications, problem list, medical history, surgical history,  family history, social history, and previous encounter notes.  I, Water quality scientist, CMA, am acting as Location manager for [employer], DO.  I have reviewed the above documentation for accuracy and completeness, and I agree with the above. Mellody Dance, DO

## 2020-09-18 DIAGNOSIS — Z20822 Contact with and (suspected) exposure to covid-19: Secondary | ICD-10-CM | POA: Diagnosis not present

## 2020-09-22 DIAGNOSIS — M545 Low back pain, unspecified: Secondary | ICD-10-CM | POA: Diagnosis not present

## 2020-09-26 DIAGNOSIS — M545 Low back pain, unspecified: Secondary | ICD-10-CM | POA: Diagnosis not present

## 2020-09-27 DIAGNOSIS — M545 Low back pain, unspecified: Secondary | ICD-10-CM | POA: Diagnosis not present

## 2020-09-28 DIAGNOSIS — M545 Low back pain, unspecified: Secondary | ICD-10-CM | POA: Diagnosis not present

## 2020-09-30 ENCOUNTER — Other Ambulatory Visit: Payer: Self-pay | Admitting: Specialist

## 2020-09-30 DIAGNOSIS — M5136 Other intervertebral disc degeneration, lumbar region: Secondary | ICD-10-CM | POA: Diagnosis not present

## 2020-09-30 DIAGNOSIS — M545 Low back pain, unspecified: Secondary | ICD-10-CM | POA: Diagnosis not present

## 2020-09-30 DIAGNOSIS — Z0000 Encounter for general adult medical examination without abnormal findings: Secondary | ICD-10-CM | POA: Diagnosis not present

## 2020-09-30 DIAGNOSIS — G8929 Other chronic pain: Secondary | ICD-10-CM

## 2020-10-04 DIAGNOSIS — M5416 Radiculopathy, lumbar region: Secondary | ICD-10-CM | POA: Diagnosis not present

## 2020-10-05 ENCOUNTER — Ambulatory Visit (INDEPENDENT_AMBULATORY_CARE_PROVIDER_SITE_OTHER): Payer: Medicare PPO | Admitting: Family Medicine

## 2020-10-06 ENCOUNTER — Ambulatory Visit
Admission: RE | Admit: 2020-10-06 | Discharge: 2020-10-06 | Disposition: A | Discharge status: Home / Self Care | Payer: Medicare PPO | Source: Ambulatory Visit | Attending: Specialist | Admitting: Specialist

## 2020-10-06 ENCOUNTER — Other Ambulatory Visit: Payer: Self-pay

## 2020-10-06 DIAGNOSIS — M5126 Other intervertebral disc displacement, lumbar region: Secondary | ICD-10-CM | POA: Diagnosis not present

## 2020-10-06 DIAGNOSIS — G8929 Other chronic pain: Secondary | ICD-10-CM

## 2020-10-06 DIAGNOSIS — M545 Low back pain, unspecified: Secondary | ICD-10-CM

## 2020-10-06 MED ORDER — METHYLPREDNISOLONE ACETATE 40 MG/ML INJ SUSP (RADIOLOG
120.0000 mg | Freq: Once | INTRAMUSCULAR | Status: AC
  Administered 2020-10-06: 120 mg via EPIDURAL

## 2020-10-06 MED ORDER — IOPAMIDOL (ISOVUE-M 200) INJECTION 41%
1.0000 mL | Freq: Once | INTRAMUSCULAR | Status: AC
  Administered 2020-10-06: 1 mL via EPIDURAL

## 2020-10-06 NOTE — Discharge Instructions (Signed)

## 2020-10-06 NOTE — Discharge Instr - Other Orders (Addendum)
1335: Pt c/o her left leg being completely numb post injection. Dr. Vernard Gambles came over to speak with the patient and assessed pts leg. Dr. Vernard Gambles reassured pt that this is a side effect of the injection and it would wear off.   1505: Pt stayed in nurses station until her legs felt "stable" per patient.   Took patient to car via wheelchair, assisted into car. CG driving.

## 2020-10-07 DIAGNOSIS — M5136 Other intervertebral disc degeneration, lumbar region: Secondary | ICD-10-CM | POA: Diagnosis not present

## 2020-10-07 DIAGNOSIS — M545 Low back pain, unspecified: Secondary | ICD-10-CM | POA: Diagnosis not present

## 2020-10-12 DIAGNOSIS — M5416 Radiculopathy, lumbar region: Secondary | ICD-10-CM | POA: Diagnosis not present

## 2020-10-13 DIAGNOSIS — R32 Unspecified urinary incontinence: Secondary | ICD-10-CM | POA: Diagnosis not present

## 2020-10-13 DIAGNOSIS — E785 Hyperlipidemia, unspecified: Secondary | ICD-10-CM | POA: Diagnosis not present

## 2020-10-13 DIAGNOSIS — R059 Cough, unspecified: Secondary | ICD-10-CM | POA: Diagnosis not present

## 2020-10-13 DIAGNOSIS — Z6828 Body mass index (BMI) 28.0-28.9, adult: Secondary | ICD-10-CM | POA: Diagnosis not present

## 2020-10-13 DIAGNOSIS — M1990 Unspecified osteoarthritis, unspecified site: Secondary | ICD-10-CM | POA: Diagnosis not present

## 2020-10-13 DIAGNOSIS — G8929 Other chronic pain: Secondary | ICD-10-CM | POA: Diagnosis not present

## 2020-10-13 DIAGNOSIS — E663 Overweight: Secondary | ICD-10-CM | POA: Diagnosis not present

## 2020-10-13 DIAGNOSIS — I10 Essential (primary) hypertension: Secondary | ICD-10-CM | POA: Diagnosis not present

## 2020-10-14 DIAGNOSIS — M5416 Radiculopathy, lumbar region: Secondary | ICD-10-CM | POA: Diagnosis not present

## 2020-10-18 DIAGNOSIS — M5416 Radiculopathy, lumbar region: Secondary | ICD-10-CM | POA: Diagnosis not present

## 2020-10-21 DIAGNOSIS — M5416 Radiculopathy, lumbar region: Secondary | ICD-10-CM | POA: Diagnosis not present

## 2020-10-24 DIAGNOSIS — M5416 Radiculopathy, lumbar region: Secondary | ICD-10-CM | POA: Diagnosis not present

## 2020-10-27 DIAGNOSIS — M5416 Radiculopathy, lumbar region: Secondary | ICD-10-CM | POA: Diagnosis not present

## 2020-10-28 DIAGNOSIS — M5136 Other intervertebral disc degeneration, lumbar region: Secondary | ICD-10-CM | POA: Diagnosis not present

## 2020-10-28 DIAGNOSIS — M545 Low back pain, unspecified: Secondary | ICD-10-CM | POA: Diagnosis not present

## 2020-10-31 DIAGNOSIS — M5416 Radiculopathy, lumbar region: Secondary | ICD-10-CM | POA: Diagnosis not present

## 2020-12-14 DIAGNOSIS — M545 Low back pain: Secondary | ICD-10-CM | POA: Diagnosis not present

## 2020-12-22 DIAGNOSIS — M545 Low back pain: Secondary | ICD-10-CM | POA: Diagnosis not present

## 2021-01-04 DIAGNOSIS — M545 Low back pain: Secondary | ICD-10-CM | POA: Diagnosis not present

## 2021-02-28 DIAGNOSIS — Z20822 Contact with and (suspected) exposure to covid-19: Secondary | ICD-10-CM | POA: Diagnosis not present

## 2021-04-10 ENCOUNTER — Other Ambulatory Visit: Payer: Self-pay | Admitting: Internal Medicine

## 2021-04-10 DIAGNOSIS — Z1231 Encounter for screening mammogram for malignant neoplasm of breast: Secondary | ICD-10-CM

## 2021-04-20 DIAGNOSIS — E559 Vitamin D deficiency, unspecified: Secondary | ICD-10-CM | POA: Diagnosis not present

## 2021-04-20 DIAGNOSIS — E785 Hyperlipidemia, unspecified: Secondary | ICD-10-CM | POA: Diagnosis not present

## 2021-04-20 DIAGNOSIS — R7301 Impaired fasting glucose: Secondary | ICD-10-CM | POA: Diagnosis not present

## 2021-04-27 DIAGNOSIS — R82998 Other abnormal findings in urine: Secondary | ICD-10-CM | POA: Diagnosis not present

## 2021-04-27 DIAGNOSIS — Z1212 Encounter for screening for malignant neoplasm of rectum: Secondary | ICD-10-CM | POA: Diagnosis not present

## 2021-04-27 DIAGNOSIS — Q8789 Other specified congenital malformation syndromes, not elsewhere classified: Secondary | ICD-10-CM | POA: Diagnosis not present

## 2021-04-27 DIAGNOSIS — M179 Osteoarthritis of knee, unspecified: Secondary | ICD-10-CM | POA: Diagnosis not present

## 2021-04-27 DIAGNOSIS — Z0000 Encounter for general adult medical examination without abnormal findings: Secondary | ICD-10-CM | POA: Diagnosis not present

## 2021-04-27 DIAGNOSIS — N281 Cyst of kidney, acquired: Secondary | ICD-10-CM | POA: Diagnosis not present

## 2021-04-27 DIAGNOSIS — R7301 Impaired fasting glucose: Secondary | ICD-10-CM | POA: Diagnosis not present

## 2021-04-27 DIAGNOSIS — E559 Vitamin D deficiency, unspecified: Secondary | ICD-10-CM | POA: Diagnosis not present

## 2021-04-27 DIAGNOSIS — I10 Essential (primary) hypertension: Secondary | ICD-10-CM | POA: Diagnosis not present

## 2021-04-27 DIAGNOSIS — E785 Hyperlipidemia, unspecified: Secondary | ICD-10-CM | POA: Diagnosis not present

## 2021-05-03 ENCOUNTER — Other Ambulatory Visit: Payer: Self-pay | Admitting: Internal Medicine

## 2021-05-03 DIAGNOSIS — Q8789 Other specified congenital malformation syndromes, not elsewhere classified: Secondary | ICD-10-CM

## 2021-05-03 DIAGNOSIS — N281 Cyst of kidney, acquired: Secondary | ICD-10-CM

## 2021-05-05 ENCOUNTER — Ambulatory Visit
Admission: RE | Admit: 2021-05-05 | Discharge: 2021-05-05 | Disposition: A | Discharge status: Home / Self Care | Payer: Medicare PPO | Source: Ambulatory Visit | Attending: Internal Medicine | Admitting: Internal Medicine

## 2021-05-05 ENCOUNTER — Other Ambulatory Visit: Payer: Self-pay

## 2021-05-05 DIAGNOSIS — Z9049 Acquired absence of other specified parts of digestive tract: Secondary | ICD-10-CM | POA: Diagnosis not present

## 2021-05-05 DIAGNOSIS — N281 Cyst of kidney, acquired: Secondary | ICD-10-CM

## 2021-05-05 DIAGNOSIS — Q8789 Other specified congenital malformation syndromes, not elsewhere classified: Secondary | ICD-10-CM

## 2021-05-05 MED ORDER — GADOBENATE DIMEGLUMINE 529 MG/ML IV SOLN
15.0000 mL | Freq: Once | INTRAVENOUS | Status: AC | PRN
  Administered 2021-05-05: 15 mL via INTRAVENOUS

## 2021-05-23 ENCOUNTER — Encounter (HOSPITAL_COMMUNITY): Payer: Self-pay

## 2021-05-23 ENCOUNTER — Inpatient Hospital Stay (HOSPITAL_COMMUNITY)
Admission: EM | Admit: 2021-05-23 | Discharge: 2021-05-28 | DRG: 493 | Disposition: A | Discharge status: Home / Self Care | Payer: Medicare PPO | Attending: Internal Medicine | Admitting: Internal Medicine

## 2021-05-23 ENCOUNTER — Emergency Department (HOSPITAL_COMMUNITY): Payer: Medicare PPO

## 2021-05-23 ENCOUNTER — Other Ambulatory Visit: Payer: Self-pay

## 2021-05-23 DIAGNOSIS — S82142D Displaced bicondylar fracture of left tibia, subsequent encounter for closed fracture with routine healing: Secondary | ICD-10-CM | POA: Diagnosis not present

## 2021-05-23 DIAGNOSIS — Z9049 Acquired absence of other specified parts of digestive tract: Secondary | ICD-10-CM | POA: Diagnosis not present

## 2021-05-23 DIAGNOSIS — E785 Hyperlipidemia, unspecified: Secondary | ICD-10-CM | POA: Diagnosis present

## 2021-05-23 DIAGNOSIS — Z8249 Family history of ischemic heart disease and other diseases of the circulatory system: Secondary | ICD-10-CM

## 2021-05-23 DIAGNOSIS — S82142A Displaced bicondylar fracture of left tibia, initial encounter for closed fracture: Principal | ICD-10-CM | POA: Diagnosis present

## 2021-05-23 DIAGNOSIS — Y92009 Unspecified place in unspecified non-institutional (private) residence as the place of occurrence of the external cause: Secondary | ICD-10-CM | POA: Diagnosis not present

## 2021-05-23 DIAGNOSIS — G8918 Other acute postprocedural pain: Secondary | ICD-10-CM | POA: Diagnosis not present

## 2021-05-23 DIAGNOSIS — W19XXXA Unspecified fall, initial encounter: Secondary | ICD-10-CM

## 2021-05-23 DIAGNOSIS — S82152A Displaced fracture of left tibial tuberosity, initial encounter for closed fracture: Secondary | ICD-10-CM | POA: Diagnosis not present

## 2021-05-23 DIAGNOSIS — Q8789 Other specified congenital malformation syndromes, not elsewhere classified: Secondary | ICD-10-CM | POA: Diagnosis not present

## 2021-05-23 DIAGNOSIS — Z90710 Acquired absence of both cervix and uterus: Secondary | ICD-10-CM | POA: Diagnosis not present

## 2021-05-23 DIAGNOSIS — Z20822 Contact with and (suspected) exposure to covid-19: Secondary | ICD-10-CM | POA: Diagnosis present

## 2021-05-23 DIAGNOSIS — Z79899 Other long term (current) drug therapy: Secondary | ICD-10-CM | POA: Diagnosis not present

## 2021-05-23 DIAGNOSIS — N1831 Chronic kidney disease, stage 3a: Secondary | ICD-10-CM | POA: Diagnosis not present

## 2021-05-23 DIAGNOSIS — I10 Essential (primary) hypertension: Secondary | ICD-10-CM | POA: Diagnosis not present

## 2021-05-23 DIAGNOSIS — Z90721 Acquired absence of ovaries, unilateral: Secondary | ICD-10-CM | POA: Diagnosis not present

## 2021-05-23 DIAGNOSIS — R531 Weakness: Secondary | ICD-10-CM | POA: Diagnosis not present

## 2021-05-23 DIAGNOSIS — E559 Vitamin D deficiency, unspecified: Secondary | ICD-10-CM | POA: Diagnosis present

## 2021-05-23 DIAGNOSIS — I129 Hypertensive chronic kidney disease with stage 1 through stage 4 chronic kidney disease, or unspecified chronic kidney disease: Secondary | ICD-10-CM | POA: Diagnosis present

## 2021-05-23 DIAGNOSIS — H409 Unspecified glaucoma: Secondary | ICD-10-CM | POA: Diagnosis present

## 2021-05-23 DIAGNOSIS — S83105A Unspecified dislocation of left knee, initial encounter: Secondary | ICD-10-CM | POA: Diagnosis present

## 2021-05-23 DIAGNOSIS — M25562 Pain in left knee: Secondary | ICD-10-CM | POA: Diagnosis present

## 2021-05-23 DIAGNOSIS — Y9341 Activity, dancing: Secondary | ICD-10-CM | POA: Diagnosis not present

## 2021-05-23 DIAGNOSIS — M7989 Other specified soft tissue disorders: Secondary | ICD-10-CM | POA: Diagnosis not present

## 2021-05-23 DIAGNOSIS — M25552 Pain in left hip: Secondary | ICD-10-CM | POA: Diagnosis not present

## 2021-05-23 DIAGNOSIS — S8992XA Unspecified injury of left lower leg, initial encounter: Secondary | ICD-10-CM

## 2021-05-23 DIAGNOSIS — S0990XA Unspecified injury of head, initial encounter: Secondary | ICD-10-CM | POA: Diagnosis not present

## 2021-05-23 DIAGNOSIS — W010XXA Fall on same level from slipping, tripping and stumbling without subsequent striking against object, initial encounter: Secondary | ICD-10-CM | POA: Diagnosis present

## 2021-05-23 DIAGNOSIS — R5381 Other malaise: Secondary | ICD-10-CM | POA: Diagnosis not present

## 2021-05-23 DIAGNOSIS — Z419 Encounter for procedure for purposes other than remedying health state, unspecified: Secondary | ICD-10-CM

## 2021-05-23 DIAGNOSIS — G629 Polyneuropathy, unspecified: Secondary | ICD-10-CM | POA: Diagnosis present

## 2021-05-23 DIAGNOSIS — R609 Edema, unspecified: Secondary | ICD-10-CM | POA: Diagnosis not present

## 2021-05-23 DIAGNOSIS — Z9889 Other specified postprocedural states: Secondary | ICD-10-CM | POA: Diagnosis not present

## 2021-05-23 DIAGNOSIS — Z83438 Family history of other disorder of lipoprotein metabolism and other lipidemia: Secondary | ICD-10-CM

## 2021-05-23 DIAGNOSIS — D696 Thrombocytopenia, unspecified: Secondary | ICD-10-CM | POA: Diagnosis present

## 2021-05-23 DIAGNOSIS — S82192A Other fracture of upper end of left tibia, initial encounter for closed fracture: Secondary | ICD-10-CM | POA: Diagnosis not present

## 2021-05-23 DIAGNOSIS — K760 Fatty (change of) liver, not elsewhere classified: Secondary | ICD-10-CM | POA: Diagnosis present

## 2021-05-23 DIAGNOSIS — T148XXA Other injury of unspecified body region, initial encounter: Secondary | ICD-10-CM

## 2021-05-23 DIAGNOSIS — Z7401 Bed confinement status: Secondary | ICD-10-CM | POA: Diagnosis not present

## 2021-05-23 MED ORDER — HYDROMORPHONE HCL 1 MG/ML IJ SOLN
0.5000 mg | Freq: Once | INTRAMUSCULAR | Status: AC
Start: 2021-05-23 — End: 2021-05-23
  Administered 2021-05-23: 0.5 mg via INTRAVENOUS
  Filled 2021-05-23: qty 1

## 2021-05-23 NOTE — ED Provider Notes (Signed)
[facility] DEPT Provider Note   CSN: [id number] Arrival date & time: 05/23/21  2155     History Chief Complaint  Patient presents with   Lytle Michaels    Julie Moran is a 65 y.o. female with a hx of birt-hogg-dube syndrome, depression, hypertension, hyperlipidemia, and CKD who presents to the ED with complaints of left lower leg/knee pain S/p fall shortly PTA.  Patient states that she was dancing with a partner when they got tangled/tripped and fell to the ground.  She denies head injury or loss of consciousness.  She reports pain to the left proximal lower leg, it is severe, worse with any attempts of movement, alleviated some with fentanyl given by EMS.  Denies numbness, tingling, weakness, or other areas of injury.  She denies anticoagulation use.  She has seen [facility] previously.  HPI     Past Medical History:  Diagnosis Date   Allergy    Atrophic vaginitis    Birt-Hogg-Dube syndrome    lung cysts    Birt-Hogg-Dube syndrome    Chest pain    Colon polyps    adenomatous   Constipation    Depression    Diverticulosis    Dry skin    Fatty liver    Floaters    Gall bladder disease    Glaucoma    Heart murmur    Herpes simplex    Hyperlipidemia    Hypertension    Joint pain    Lichen sclerosus    Pancreatitis    Pancreatitis    Pneumothorax, right    spontaneous   Shortness of breath    Shortness of breath on exertion    Swelling of both lower extremities    Trouble in sleeping     Patient Active Problem List   Diagnosis Date Noted   Stage 3a chronic kidney disease ([place]) 11/26/2019   Other hyperlipidemia 11/09/2019   Vitamin D deficiency 10/15/2018   Insulin resistance 10/15/2018   Fatty liver 10/15/2018   Other fatigue 07/03/2018   Shortness of breath on exertion 07/03/2018   Elevated LFTs 07/03/2018   Essential hypertension 07/03/2018   Dyslipidemia 09/17/2017   Benign essential HTN 09/17/2017   Acute infection of  nasal sinus 09/17/2017   Atrophic vaginitis 09/17/2017   Family history of malignant neoplasm of breast 09/17/2017   Hereditary disease 09/17/2017   Herpes simplex 65/46/5035   Lichen sclerosus et atrophicus of the vulva 09/17/2017   Reduced libido 09/17/2017   Abnormal urination 08/10/2016   Obesity with body mass index 30 or greater 08/10/2016   Pneumothorax 09/24/2014   Benign melanoma 05/19/2014   Atheroma, skin 05/19/2014   D (diarrhea) 04/27/2013   Encounter for immunization 03/11/2013   Acute antritis 12/09/2012   Cutaneous eruption 11/27/2012   Inflamed nasal mucosa 11/27/2012   Depressive disorder 03/10/2012   Elevated fasting blood sugar 03/10/2012   Adiposity 03/10/2012   Extremity numbness 01/12/2011   Personal history of endocrine, metabolic, and immunity disorder 07/12/2010   Health examination of defined subpopulation 02/17/2010   Atypical chest pain 01/26/2010   Depression, major, single episode 01/26/2010   Polypharmacy 01/26/2010   Gall stone pancreatitis 01/26/2010   Abnormal weight gain 01/26/2010   Cough 12/13/2009    Past Surgical History:  Procedure Laterality Date   ABDOMINAL HYSTERECTOMY     BREAST BIOPSY  03/29/2006   BREAST BIOPSY Bilateral 06/14/1998   BREAST EXCISIONAL BIOPSY Bilateral 1999   BREAST LUMPECTOMY  4656,[id number],5170   x5  times total , both breasts   CHOLECYSTECTOMY     COLONOSCOPY     2016   epidermoid cyst Right 02/2020   below Right breast   hysterctomy     abdominal   POLYPECTOMY     right ovary removed     UPPER GASTROINTESTINAL ENDOSCOPY       OB History     Gravida  1   Para      Term      Preterm      AB      Living  1      SAB      IAB      Ectopic      Multiple      Live Births              Family History  Problem Relation Age of Onset   Diabetes Mother    Breast cancer Mother    CVA Mother    Hypertension Mother    Heart disease Mother    Thyroid disease Mother    Cancer Mother     Diabetes Father    Prostate cancer Father    Heart disease Father    Hypertension Father    Hyperlipidemia Father    Thyroid disease Father    Cancer Father    Obesity Father    Liver disease Paternal Grandmother    Breast cancer Maternal Aunt        x 3   CVA Paternal Grandfather    Colon cancer Neg Hx    Esophageal cancer Neg Hx    Stomach cancer Neg Hx    Rectal cancer Neg Hx     Social History   Tobacco Use   Smoking status: Never   Smokeless tobacco: Never  Substance Use Topics   Alcohol use: No    Alcohol/week: 0.0 standard drinks   Drug use: No    Home Medications Prior to Admission medications   Medication Sig Start Date End Date Taking? Authorizing Provider  amLODipine (NORVASC) 2.5 MG tablet Take 1 tablet by mouth Daily.  01/01/12   [provider]  cholecalciferol (VITAMIN D3) 25 MCG (1000 UNIT) tablet Take 1,000 Units by mouth daily.    [provider]  Multiple Vitamin (MULTIVITAMIN ADULT PO) daily.    [provider]  pravastatin (PRAVACHOL) 20 MG tablet Take 20 mg by mouth daily.  03/26/15   [provider]  vitamin C (ASCORBIC ACID) 250 MG tablet Take 250 mg by mouth daily.    [provider]  Vitamin D, Ergocalciferol, (DRISDOL) 1.25 MG (50000 UNIT) CAPS capsule Take 1 capsule (50,000 Units total) by mouth every 7 (seven) days. 09/14/20   Mellody Dance, DO    Allergies    No known allergies  Review of Systems   Review of Systems  Constitutional:  Negative for chills and fever.  Respiratory:  Negative for shortness of breath.   Cardiovascular:  Negative for chest pain.  Gastrointestinal:  Negative for abdominal pain.  Musculoskeletal:  Positive for arthralgias and myalgias. Negative for back pain and neck pain.  Neurological:  Negative for syncope, weakness and numbness.  All other systems reviewed and are negative.  Physical Exam Updated Vital Signs BP (!) 164/84 (BP Location: Right Arm)   Pulse  62   Temp 98.2 F (36.8 C) (Oral)   Resp 16   Ht 5\' 3"  (1.6 m)   Wt 74.8 kg   SpO2 98%   BMI  29.23 kg/m   Physical Exam Vitals and nursing note reviewed.  Constitutional:      General: She is not in acute distress.    Appearance: She is not ill-appearing or toxic-appearing.  HENT:     Head: Normocephalic and atraumatic.     Comments: No raccoon eyes. Eyes:     Pupils: Pupils are equal, round, and reactive to light.  Cardiovascular:     Rate and Rhythm: Normal rate and regular rhythm.     Comments: 2+ symmetric DP and PT pulses bilaterally. Pulmonary:     Effort: Pulmonary effort is normal.     Breath sounds: Normal breath sounds.  Chest:     Chest wall: No tenderness.  Abdominal:     General: There is no distension.     Palpations: Abdomen is soft.     Tenderness: There is no abdominal tenderness.  Musculoskeletal:     Cervical back: Neck supple. No tenderness.     Comments: Upper extremities: No focal bony tenderness, actively moving in all major joints. Back: No midline tenderness Lower extremities: Swelling with obvious deformity to the left knee/proximal lower leg.  No significant open wounds.  Patient has intact active range of motion throughout the right lower extremity.  Left lower extremity able to move toes/ankle, difficult evaluation of left knee/hip given pain/swelling to the left knee/proximal lower leg area.  Patient is significantly tender to the proximal tibia and the anterior knee.  She has some mild tenderness to the left lateral hip.  Otherwise nontender.  Skin:    General: Skin is warm and dry.  Neurological:     Mental Status: She is alert.     Comments: Sensation gross intact bilateral lower extremities.  5 out of 5 strength with plantar dorsiflexion bilaterally.  Psychiatric:        Mood and Affect: Mood normal.    ED Results / Procedures / Treatments   Labs (all labs ordered are listed, but only abnormal results are displayed) Labs Reviewed - No  data to display  EKG None  Radiology DG Knee Left Port  Result Date: 05/23/2021 CLINICAL DATA:  Fall, deformity, swelling EXAM: PORTABLE LEFT KNEE - 1-2 VIEW COMPARISON:  None. FINDINGS: There is a proximal tibial fracture extending into the knee joint at the tibial spines. This extends into the metaphysis with a linear extension into the proximal diaphysis. Significant displacement of the medial tibial fragment. Joint effusion with lipohemarthrosis. IMPRESSION: Displaced proximal tibial fracture extending from the tibial spines distally into the metadiaphysis. Electronically Signed   By: Rolm Baptise M.D.   On: 05/23/2021 22:31    Procedures Procedures   Medications Ordered in ED Medications - No data to display  ED Course  I have reviewed the triage vital signs and the nursing notes.  Pertinent labs & imaging results that were available during my care of the patient were reviewed by me and considered in my medical decision making (see chart for details).    MDM Rules/Calculators/A&P                         Patient presents to the ED with complaints of left knee/lower leg pain.  Nontoxic, vitals w/ elevated BP- doubt HTN emergency.  Obvious deformity and swelling to patient's left knee/proximal lower leg.  Some additional areas of tenderness.  X-rays ordered.  Dilaudid ordered for pain control.  Additional history obtained:  Additional history obtained from chart review & nursing note  review.   Imaging Studies ordered:  I ordered imaging studies which included left knee/tib/fib/hip x-rays, I independently reviewed, formal radiology impression shows:  Displaced proximal tibial fracture extending from the tibial spines distally into the metadiaphysis  Discussed with orthopedic surgeon Dr. Wynelle Link- recommends knee immobilizer, NWB, CT Knee wo contrast while in the ED, follow up in clinic if patient able to be safely discharged.   Patient received fentanyl with EMS, Dilaudid in the  emergency department, and Norco orally in the ED.  Attempted ambulation with knee immobilizer and crutches, ultimately patient with significant pain, very unsteady, felt unsafe and was unable to attempt ambulation.  She lives alone, she does not feel comfortable going home.  Will obtain basic labs and discuss with hospital service for admission.  Labs:  I personally reviewed and interpreted labs including: CBC: Thrombocytopenia at 13, unclear etiology, will repeat CBC for accuracy and obtain coags and head CT given patient did have a fall with this potentially low platelet count. BMP: Mild hypocalcemia  Repeat CBC with platelet count of 160, initial labs were drawn off of patient's IV whereas repeat were a straight stick, patient had a platelet count of 161 in May of this year per her report, suspect this is the accurate platelet count for this patient.  APTT/PT/INR/hepatic function panel are unremarkable.  CT head without bleed, additional findings as above.  05:20: CONSULT: Discussed with hospitalist Dr. Sidney Ace- accepts admission.   Findings and plan of care discussed with supervising physician Dr. Betsey Holiday who is in agreement.   Portions of this note were generated with [employer]. Dictation errors may occur despite best attempts at proofreading.  Final Clinical Impression(s) / ED Diagnoses Final diagnoses:  Fall, initial encounter  Closed fracture of left tibial plateau, initial encounter    Rx / DC Orders ED Discharge Orders     None        Amaryllis Dyke, PA-C 05/24/21 0525    Orpah Greek, MD 05/24/21 [phone]

## 2021-05-23 NOTE — ED Triage Notes (Signed)
Pt bib EMS from [facility]. She fell while dancing. Deformity and swelling noted. 100 mcg of fentanyl given by EMS. Denies hitting her head.

## 2021-05-24 ENCOUNTER — Encounter (HOSPITAL_COMMUNITY): Payer: Self-pay | Admitting: Family Medicine

## 2021-05-24 ENCOUNTER — Emergency Department (HOSPITAL_COMMUNITY): Payer: Medicare PPO

## 2021-05-24 DIAGNOSIS — W010XXA Fall on same level from slipping, tripping and stumbling without subsequent striking against object, initial encounter: Secondary | ICD-10-CM | POA: Diagnosis present

## 2021-05-24 DIAGNOSIS — Z83438 Family history of other disorder of lipoprotein metabolism and other lipidemia: Secondary | ICD-10-CM | POA: Diagnosis not present

## 2021-05-24 DIAGNOSIS — Y92009 Unspecified place in unspecified non-institutional (private) residence as the place of occurrence of the external cause: Secondary | ICD-10-CM | POA: Diagnosis not present

## 2021-05-24 DIAGNOSIS — W19XXXA Unspecified fall, initial encounter: Secondary | ICD-10-CM | POA: Diagnosis not present

## 2021-05-24 DIAGNOSIS — E785 Hyperlipidemia, unspecified: Secondary | ICD-10-CM | POA: Diagnosis present

## 2021-05-24 DIAGNOSIS — H409 Unspecified glaucoma: Secondary | ICD-10-CM | POA: Diagnosis present

## 2021-05-24 DIAGNOSIS — Y9341 Activity, dancing: Secondary | ICD-10-CM | POA: Diagnosis not present

## 2021-05-24 DIAGNOSIS — I129 Hypertensive chronic kidney disease with stage 1 through stage 4 chronic kidney disease, or unspecified chronic kidney disease: Secondary | ICD-10-CM | POA: Diagnosis present

## 2021-05-24 DIAGNOSIS — Z90721 Acquired absence of ovaries, unilateral: Secondary | ICD-10-CM | POA: Diagnosis not present

## 2021-05-24 DIAGNOSIS — N1831 Chronic kidney disease, stage 3a: Secondary | ICD-10-CM | POA: Diagnosis present

## 2021-05-24 DIAGNOSIS — S82142A Displaced bicondylar fracture of left tibia, initial encounter for closed fracture: Principal | ICD-10-CM

## 2021-05-24 DIAGNOSIS — I10 Essential (primary) hypertension: Secondary | ICD-10-CM | POA: Diagnosis not present

## 2021-05-24 DIAGNOSIS — M25562 Pain in left knee: Secondary | ICD-10-CM | POA: Diagnosis present

## 2021-05-24 DIAGNOSIS — S83105A Unspecified dislocation of left knee, initial encounter: Secondary | ICD-10-CM | POA: Diagnosis present

## 2021-05-24 DIAGNOSIS — Z8249 Family history of ischemic heart disease and other diseases of the circulatory system: Secondary | ICD-10-CM | POA: Diagnosis not present

## 2021-05-24 DIAGNOSIS — E559 Vitamin D deficiency, unspecified: Secondary | ICD-10-CM | POA: Diagnosis present

## 2021-05-24 DIAGNOSIS — Q8789 Other specified congenital malformation syndromes, not elsewhere classified: Secondary | ICD-10-CM | POA: Diagnosis not present

## 2021-05-24 DIAGNOSIS — G629 Polyneuropathy, unspecified: Secondary | ICD-10-CM | POA: Diagnosis present

## 2021-05-24 DIAGNOSIS — Z9049 Acquired absence of other specified parts of digestive tract: Secondary | ICD-10-CM | POA: Diagnosis not present

## 2021-05-24 DIAGNOSIS — D696 Thrombocytopenia, unspecified: Secondary | ICD-10-CM | POA: Diagnosis present

## 2021-05-24 DIAGNOSIS — Z79899 Other long term (current) drug therapy: Secondary | ICD-10-CM | POA: Diagnosis not present

## 2021-05-24 DIAGNOSIS — K760 Fatty (change of) liver, not elsewhere classified: Secondary | ICD-10-CM | POA: Diagnosis present

## 2021-05-24 DIAGNOSIS — Z20822 Contact with and (suspected) exposure to covid-19: Secondary | ICD-10-CM | POA: Diagnosis present

## 2021-05-24 DIAGNOSIS — Z90710 Acquired absence of both cervix and uterus: Secondary | ICD-10-CM | POA: Diagnosis not present

## 2021-05-24 LAB — HEPATIC FUNCTION PANEL
ALT: 22 U/L (ref 0–44)
AST: 22 U/L (ref 15–41)
Albumin: 3.8 g/dL (ref 3.5–5.0)
Alkaline Phosphatase: 78 U/L (ref 38–126)
Bilirubin, Direct: 0.1 mg/dL (ref 0.0–0.2)
Indirect Bilirubin: 0.4 mg/dL (ref 0.3–0.9)
Total Bilirubin: 0.5 mg/dL (ref 0.3–1.2)
Total Protein: 6.6 g/dL (ref 6.5–8.1)

## 2021-05-24 LAB — CBC
HCT: 38.4 % (ref 36.0–46.0)
Hemoglobin: 12.9 g/dL (ref 12.0–15.0)
MCH: 31.2 pg (ref 26.0–34.0)
MCHC: 33.6 g/dL (ref 30.0–36.0)
MCV: 93 fL (ref 80.0–100.0)
Platelets: 13 10*3/uL — CL (ref 150–400)
RBC: 4.13 MIL/uL (ref 3.87–5.11)
RDW: 12.9 % (ref 11.5–15.5)
WBC: 7.2 10*3/uL (ref 4.0–10.5)
nRBC: 0 % (ref 0.0–0.2)

## 2021-05-24 LAB — BASIC METABOLIC PANEL
Anion gap: 6 (ref 5–15)
BUN: 18 mg/dL (ref 8–23)
CO2: 21 mmol/L — ABNORMAL LOW (ref 22–32)
Calcium: 7.7 mg/dL — ABNORMAL LOW (ref 8.9–10.3)
Chloride: 110 mmol/L (ref 98–111)
Creatinine, Ser: 0.88 mg/dL (ref 0.44–1.00)
GFR, Estimated: >60 mL/min (ref >60)
Glucose, Bld: 129 mg/dL — ABNORMAL HIGH (ref 70–99)
Potassium: 3.8 mmol/L (ref 3.5–5.1)
Sodium: 137 mmol/L (ref 135–145)

## 2021-05-24 LAB — CBC WITH DIFFERENTIAL/PLATELET
Abs Immature Granulocytes: 0.03 10*3/uL (ref 0.00–0.07)
Basophils Absolute: 0 10*3/uL (ref 0.0–0.1)
Basophils Relative: 0 %
Eosinophils Absolute: 0 10*3/uL (ref 0.0–0.5)
Eosinophils Relative: 0 %
HCT: 38.4 % (ref 36.0–46.0)
Hemoglobin: 13.4 g/dL (ref 12.0–15.0)
Immature Granulocytes: 0 %
Lymphocytes Relative: 16 %
Lymphs Abs: 1.4 10*3/uL (ref 0.7–4.0)
MCH: 30.3 pg (ref 26.0–34.0)
MCHC: 34.9 g/dL (ref 30.0–36.0)
MCV: 86.9 fL (ref 80.0–100.0)
Monocytes Absolute: 0.6 10*3/uL (ref 0.1–1.0)
Monocytes Relative: 6 %
Neutro Abs: 7.2 10*3/uL (ref 1.7–7.7)
Neutrophils Relative %: 78 %
Platelets: 160 10*3/uL (ref 150–400)
RBC: 4.42 MIL/uL (ref 3.87–5.11)
RDW: 12.8 % (ref 11.5–15.5)
WBC: 9.2 10*3/uL (ref 4.0–10.5)
nRBC: 0 % (ref 0.0–0.2)

## 2021-05-24 LAB — PROTIME-INR
INR: 1 (ref 0.8–1.2)
Prothrombin Time: 13.3 seconds (ref 11.4–15.2)

## 2021-05-24 LAB — RESP PANEL BY RT-PCR (FLU A&B, COVID) ARPGX2
Influenza A by PCR: NEGATIVE
Influenza B by PCR: NEGATIVE
SARS Coronavirus 2 by RT PCR: NEGATIVE

## 2021-05-24 LAB — APTT: aPTT: 25 seconds (ref 24–36)

## 2021-05-24 MED ORDER — PRAVASTATIN SODIUM 40 MG PO TABS
20.0000 mg | ORAL_TABLET | Freq: Every day | ORAL | Status: DC
  Administered 2021-05-24 – 2021-05-28 (×5): 20 mg via ORAL
  Filled 2021-05-24 (×5): qty 1

## 2021-05-24 MED ORDER — ONDANSETRON HCL 4 MG/2ML IJ SOLN
4.0000 mg | Freq: Four times a day (QID) | INTRAMUSCULAR | Status: DC | PRN
  Administered 2021-05-24 – 2021-05-25 (×3): 4 mg via INTRAVENOUS
  Filled 2021-05-24 (×4): qty 2

## 2021-05-24 MED ORDER — ONDANSETRON HCL 4 MG/2ML IJ SOLN
4.0000 mg | Freq: Once | INTRAMUSCULAR | Status: AC
  Administered 2021-05-24: 4 mg via INTRAVENOUS
  Filled 2021-05-24: qty 2

## 2021-05-24 MED ORDER — HYDROCODONE-ACETAMINOPHEN 5-325 MG PO TABS
1.0000 | ORAL_TABLET | Freq: Once | ORAL | Status: AC
Start: 2021-05-24 — End: 2021-05-24
  Administered 2021-05-24: 1 via ORAL
  Filled 2021-05-24: qty 1

## 2021-05-24 MED ORDER — HYDROMORPHONE HCL 1 MG/ML IJ SOLN
0.5000 mg | INTRAMUSCULAR | Status: DC | PRN
  Administered 2021-05-24: 1 mg via INTRAVENOUS
  Administered 2021-05-24: 0.5 mg via INTRAVENOUS
  Administered 2021-05-24 – 2021-05-25 (×3): 1 mg via INTRAVENOUS
  Filled 2021-05-24 (×5): qty 1

## 2021-05-24 MED ORDER — ONDANSETRON HCL 4 MG PO TABS: 4.0000 mg | ORAL_TABLET | Freq: Four times a day (QID) | ORAL | Status: DC | PRN

## 2021-05-24 MED ORDER — HYDROMORPHONE HCL 2 MG PO TABS
2.0000 mg | ORAL_TABLET | ORAL | Status: DC | PRN
  Administered 2021-05-24 – 2021-05-25 (×2): 2 mg via ORAL
  Filled 2021-05-24 (×4): qty 1

## 2021-05-24 MED ORDER — FENTANYL CITRATE (PF) 100 MCG/2ML IJ SOLN
50.0000 ug | INTRAMUSCULAR | Status: DC | PRN
  Administered 2021-05-24: 50 ug via INTRAVENOUS
  Filled 2021-05-24: qty 2

## 2021-05-24 MED ORDER — ACETAMINOPHEN 650 MG RE SUPP: 650.0000 mg | Freq: Four times a day (QID) | RECTAL | Status: DC | PRN

## 2021-05-24 MED ORDER — ACETAMINOPHEN 325 MG PO TABS: 650.0000 mg | ORAL_TABLET | Freq: Four times a day (QID) | ORAL | Status: DC | PRN

## 2021-05-24 MED ORDER — VITAMIN D (ERGOCALCIFEROL) 1.25 MG (50000 UNIT) PO CAPS: 50000.0000 [IU] | ORAL_CAPSULE | ORAL | Status: DC

## 2021-05-24 MED ORDER — HYDROMORPHONE HCL 1 MG/ML IJ SOLN
0.5000 mg | Freq: Once | INTRAMUSCULAR | Status: AC
Start: 2021-05-24 — End: 2021-05-24
  Administered 2021-05-24: 0.5 mg via INTRAVENOUS
  Filled 2021-05-24: qty 1

## 2021-05-24 MED ORDER — HYDROMORPHONE HCL 2 MG PO TABS
4.0000 mg | ORAL_TABLET | ORAL | Status: DC | PRN
  Administered 2021-05-24: 4 mg via ORAL

## 2021-05-24 MED ORDER — SODIUM CHLORIDE 0.9 % IV SOLN: INTRAVENOUS | Status: DC

## 2021-05-24 MED ORDER — VITAMIN D 25 MCG (1000 UNIT) PO TABS
1000.0000 [IU] | ORAL_TABLET | Freq: Every day | ORAL | Status: DC
  Administered 2021-05-24 – 2021-05-28 (×5): 1000 [IU] via ORAL
  Filled 2021-05-24 (×6): qty 1

## 2021-05-24 MED ORDER — METHOCARBAMOL 500 MG PO TABS
500.0000 mg | ORAL_TABLET | Freq: Four times a day (QID) | ORAL | Status: DC | PRN
  Administered 2021-05-24: 500 mg via ORAL
  Filled 2021-05-24: qty 1

## 2021-05-24 MED ORDER — ASCORBIC ACID 500 MG PO TABS: 250.0000 mg | ORAL_TABLET | Freq: Every day | ORAL | Status: DC

## 2021-05-24 MED ORDER — TRAZODONE HCL 50 MG PO TABS: 25.0000 mg | ORAL_TABLET | Freq: Every evening | ORAL | Status: DC | PRN

## 2021-05-24 MED ORDER — AMLODIPINE BESYLATE 2.5 MG PO TABS
2.5000 mg | ORAL_TABLET | Freq: Every day | ORAL | Status: DC
  Administered 2021-05-24 – 2021-05-28 (×5): 2.5 mg via ORAL
  Filled 2021-05-24 (×5): qty 1

## 2021-05-24 MED ORDER — MAGNESIUM HYDROXIDE 400 MG/5ML PO SUSP
30.0000 mL | Freq: Every day | ORAL | Status: DC | PRN
  Filled 2021-05-24: qty 30

## 2021-05-24 NOTE — Consult Note (Addendum)
Patient ID: Julie Moran MRN: [id number] DOB/AGE: 1956/11/03 65 y.o.  Admit date: 05/23/2021  Chief Complaint: Left knee pain  Subjective: Patient is admitted for left knee pain following fall  Patient is a 65 y.o. female, past medical history significant for hypertension, hyperlipidemia, and Birt-Hogg-Dube syndrome who presented to [facility] Emergency Department due to left knee pain following a fall. Patient reports she was out dancing last night around 8:30pm, when she had a mechanical fall and her partner landed directly onto her left knee. She immediately had severe pain and was unable to bear weight. Patient was transported to the hospital via ambulance and was found to have a complex comminuted tibial plateau fracture involving both plateaus and extending down the tibial shaft. Patient was placed into a knee immobilizer and orthopedics was consulted for management of the fracture. 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 but no prior knee surgeries or interventions  Allergies: Allergies  Allergen Reactions   No Known Allergies      Medications: (Not in a hospital admission)   Past Medical History: Past Medical History:  Diagnosis Date   Allergy    Atrophic vaginitis    Birt-Hogg-Dube syndrome    lung cysts    Birt-Hogg-Dube syndrome    Chest pain    Colon polyps    adenomatous   Constipation    Depression    Diverticulosis    Dry skin    Fatty liver    Floaters    Gall bladder disease    Glaucoma    Heart murmur    Herpes simplex    Hyperlipidemia    Hypertension    Joint pain    Lichen sclerosus    Pancreatitis    Pancreatitis    Pneumothorax, right    spontaneous   Shortness of breath    Shortness of breath on exertion    Swelling of both lower extremities    Trouble in sleeping      Past Surgical History: Past Surgical History:  Procedure Laterality Date   ABDOMINAL HYSTERECTOMY     BREAST BIOPSY  03/29/2006    BREAST BIOPSY Bilateral 06/14/1998   BREAST EXCISIONAL BIOPSY Bilateral 1999   BREAST LUMPECTOMY  1973,1992,2001   x5 times total , both breasts   CHOLECYSTECTOMY     COLONOSCOPY     2016   epidermoid cyst Right 02/2020   below Right breast   hysterctomy     abdominal   POLYPECTOMY     right ovary removed     UPPER GASTROINTESTINAL ENDOSCOPY       Family History: Family History  Problem Relation Age of Onset   Diabetes Mother    Breast cancer Mother    CVA Mother    Hypertension Mother    Heart disease Mother    Thyroid disease Mother    Cancer Mother    Diabetes Father    Prostate cancer Father    Heart disease Father    Hypertension Father    Hyperlipidemia Father    Thyroid disease Father    Cancer Father    Obesity Father    Liver disease Paternal Grandmother    Breast cancer Maternal Aunt        x 3   CVA Paternal Grandfather    Colon cancer Neg Hx    Esophageal cancer Neg Hx    Stomach cancer Neg Hx    Rectal cancer Neg Hx  Social History: Social History   Tobacco Use   Smoking status: Never   Smokeless tobacco: Never  Substance Use Topics   Alcohol use: No    Alcohol/week: 0.0 standard drinks     Review of Systems Constitutional: negative Eyes: negative Respiratory: negative Cardiovascular: negative Gastrointestinal: negative Musculoskeletal:positive for arthralgias and bone pain Behavioral/Psych: negative  Physical Exam:  Vitals:  General: alert, cooperative, and no distress HENT:Head: Normocephalic, no lesions, without obvious abnormality. Neck:supple Chest:clear to auscultation, no wheezes, rales or rhonchi, symmetric air entry Heart:S1, S2 normal, no murmur, rub or gallop, regular rate and rhythm Abdomen:abdomen soft Rectal/Breast/Genitalia: Not done, not pertinent to present illness. Musculoskeletal:  Left knee exam: Swelling to the knee with no erythema or open lesions. Compartments of the calf are tender, but soft Able  to wiggle toes without difficulty. Dorsiflexion/plantar flexion intact 2+ dorsalis pedis pulse  Sensation intact distally  LABS: Results for orders placed or performed during the hospital encounter of 05/23/21  Resp Panel by RT-PCR (Flu A&B, Covid) Nasopharyngeal Swab   Specimen: Nasopharyngeal Swab; Nasopharyngeal(NP) swabs in vial transport medium  Result Value Ref Range   SARS Coronavirus 2 by RT PCR NEGATIVE NEGATIVE   Influenza A by PCR NEGATIVE NEGATIVE   Influenza B by PCR NEGATIVE NEGATIVE  CBC  Result Value Ref Range   WBC 7.2 4.0 - 10.5 K/uL   RBC 4.13 3.87 - 5.11 MIL/uL   Hemoglobin 12.9 12.0 - 15.0 g/dL   HCT 38.4 36.0 - 46.0 %   MCV 93.0 80.0 - 100.0 fL   MCH 31.2 26.0 - 34.0 pg   MCHC 33.6 30.0 - 36.0 g/dL   RDW 12.9 11.5 - 15.5 %   Platelets 13 (LL) 150 - 400 K/uL   nRBC 0.0 0.0 - 0.2 %  Basic metabolic panel  Result Value Ref Range   Sodium 137 135 - 145 mmol/L   Potassium 3.8 3.5 - 5.1 mmol/L   Chloride 110 98 - 111 mmol/L   CO2 21 (L) 22 - 32 mmol/L   Glucose, Bld 129 (H) 70 - 99 mg/dL   BUN 18 8 - 23 mg/dL   Creatinine, Ser 0.88 0.44 - 1.00 mg/dL   Calcium 7.7 (L) 8.9 - 10.3 mg/dL   GFR, Estimated >60 >60 mL/min   Anion gap 6 5 - 15  Hepatic function panel  Result Value Ref Range   Total Protein 6.6 6.5 - 8.1 g/dL   Albumin 3.8 3.5 - 5.0 g/dL   AST 22 15 - 41 U/L   ALT 22 0 - 44 U/L   Alkaline Phosphatase 78 38 - 126 U/L   Total Bilirubin 0.5 0.3 - 1.2 mg/dL   Bilirubin, Direct 0.1 0.0 - 0.2 mg/dL   Indirect Bilirubin 0.4 0.3 - 0.9 mg/dL  Protime-INR  Result Value Ref Range   Prothrombin Time 13.3 11.4 - 15.2 seconds   INR 1.0 0.8 - 1.2  APTT  Result Value Ref Range   aPTT 25 24 - 36 seconds  CBC with Differential  Result Value Ref Range   WBC 9.2 4.0 - 10.5 K/uL   RBC 4.42 3.87 - 5.11 MIL/uL   Hemoglobin 13.4 12.0 - 15.0 g/dL   HCT 38.4 36.0 - 46.0 %   MCV 86.9 80.0 - 100.0 fL   MCH 30.3 26.0 - 34.0 pg   MCHC 34.9 30.0 - 36.0 g/dL    RDW 12.8 11.5 - 15.5 %   Platelets 160 150 - 400 K/uL  nRBC 0.0 0.0 - 0.2 %   Neutrophils Relative % 78 %   Neutro Abs 7.2 1.7 - 7.7 K/uL   Lymphocytes Relative 16 %   Lymphs Abs 1.4 0.7 - 4.0 K/uL   Monocytes Relative 6 %   Monocytes Absolute 0.6 0.1 - 1.0 K/uL   Eosinophils Relative 0 %   Eosinophils Absolute 0.0 0.0 - 0.5 K/uL   Basophils Relative 0 %   Basophils Absolute 0.0 0.0 - 0.1 K/uL   Immature Granulocytes 0 %   Abs Immature Granulocytes 0.03 0.00 - 0.07 K/uL   Recent Labs    05/24/21 0154 05/24/21 0334  HGB 12.9 13.4   Recent Labs    05/24/21 0154 05/24/21 0334  WBC 7.2 9.2  RBC 4.13 4.42  HCT 38.4 38.4  PLT 13* 160   Recent Labs    05/24/21 0154  NA 137  K 3.8  CL 110  CO2 21*  BUN 18  CREATININE 0.88  GLUCOSE 129*  CALCIUM 7.7*   Recent Labs    05/24/21 0334  INR 1.0    Assessment/Plan: Complex comminuted tibial plateau fracture, left knee  Patient will require open reduction internal fixation to address fracture. Given complexity of this, will refer to trauma specialists for surgical management.   NWB, maintain knee immobilizer    Agree with above assessment and plan. This is an extremely complex fracture which will require operative fixation. I have contacted Dr Marcelino Scot, Orthopaedic trauma specialist, and await his response as I feel his level of expertise would best serve the patient given the complexity of the fracture.  Gaynelle Arabian, MD   Addendum-- I spoke with Dr. Marcelino Scot earlier this evening and either he or Dr Doreatha Martin will take over the Orthopaedic care tomorrow.

## 2021-05-24 NOTE — ED Notes (Signed)
Report given to floor nurse Kasaan Sink, RN.

## 2021-05-24 NOTE — ED Notes (Signed)
Pt placed on 2L O2 via [place] due to O2 sats decreasing to 83% on room air while asleep after narcotic administration. O2 increases on room air while pt awake and talking.

## 2021-05-24 NOTE — H&P (Signed)
Home     [place]   PATIENT NAME: Julie Moran    MR#:  [id number]  DATE OF BIRTH:  03-17-1956  DATE OF ADMISSION:  05/23/2021  PRIMARY CARE PHYSICIAN: Leanna Battles, MD   Patient is coming from: Home  REQUESTING/REFERRING PHYSICIAN: Petrucelli, Glynda Jaeger, PA-C  CHIEF COMPLAINT:   Chief Complaint  Patient presents with  . Fall    HISTORY OF PRESENT ILLNESS:  Julie Moran is a 65 y.o. Caucasian female with medical history significant for multiple medical problems that are mentioned below, who presented to the emergency room with acute onset of accidental mechanical fall while she was dancing with subsequent left knee pain and inability to ambulate due to pain.  The patient denies any presyncope or syncope, headache or dizziness or blurred vision, paresthesias or focal muscle weakness, chest pain or dyspnea or palpitations.  No recent fever or chills.  No dysuria, oliguria or hematuria or flank pain.  She denied any other injuries. ED Course: When she came to the ER blood pressure was 164/84 with otherwise normal vital signs.  Labs revealed unremarkable CBC and BMP as well as LFTs.  Influenza antigens and COVID-19 PCR came back negative.    Imaging: Noncontrasted head CT scan revealed the following:  No significant scalp swelling or calvarial fracture. No acute intracranial hemorrhage or other traumatic findings.   Questionable ovoid mass along the left cerebellopontine angle measuring 1.4 x 0.6 x 1 cm in size, possible meningioma. Outpatient MRI with and without contrast could be confirmatory.  Left knee CT without contrast revealed: Complex comminuted proximal tibial fracture involving the tibial spines, both tibial plateaus and extending into the tibial shaft.   Medial displacement of the medial tibial plateau and femur.  The patient was given p.o. Norco as well as IV Dilaudid 0.5 mg twice and 4 mg IV Zofran.  She had a knee immobilizer placed and was  still having significant pain unable to ambulate.  Dr. Wynelle Link was notified about the patient and is aware.  The patient will be admitted to a medical bed for further evaluation and management.  PAST MEDICAL HISTORY:   Past Medical History:  Diagnosis Date  . Allergy   . Atrophic vaginitis   . Birt-Hogg-Dube syndrome    lung cysts   . Birt-Hogg-Dube syndrome   . Chest pain   . Colon polyps    adenomatous  . Constipation   . Depression   . Diverticulosis   . Dry skin   . Fatty liver   . Floaters   . Gall bladder disease   . Glaucoma   . Heart murmur   . Herpes simplex   . Hyperlipidemia   . Hypertension   . Joint pain   . Lichen sclerosus   . Pancreatitis   . Pancreatitis   . Pneumothorax, right    spontaneous  . Shortness of breath   . Shortness of breath on exertion   . Swelling of both lower extremities   . Trouble in sleeping     PAST SURGICAL HISTORY:   Past Surgical History:  Procedure Laterality Date  . ABDOMINAL HYSTERECTOMY    . BREAST BIOPSY  03/29/2006  . BREAST BIOPSY Bilateral 06/14/1998  . BREAST EXCISIONAL BIOPSY Bilateral 1999  . BREAST LUMPECTOMY  [phone]   x5 times total , both breasts  . CHOLECYSTECTOMY    . COLONOSCOPY     2016  . epidermoid cyst Right 02/2020   below Right breast  .  hysterctomy     abdominal  . POLYPECTOMY    . right ovary removed    . UPPER GASTROINTESTINAL ENDOSCOPY      SOCIAL HISTORY:   Social History   Tobacco Use  . Smoking status: Never  . Smokeless tobacco: Never  Substance Use Topics  . Alcohol use: No    Alcohol/week: 0.0 standard drinks    FAMILY HISTORY:   Family History  Problem Relation Age of Onset  . Diabetes Mother   . Breast cancer Mother   . CVA Mother   . Hypertension Mother   . Heart disease Mother   . Thyroid disease Mother   . Cancer Mother   . Diabetes Father   . Prostate cancer Father   . Heart disease Father   . Hypertension Father   . Hyperlipidemia Father   .  Thyroid disease Father   . Cancer Father   . Obesity Father   . Liver disease Paternal Grandmother   . Breast cancer Maternal Aunt        x 3  . CVA Paternal Grandfather   . Colon cancer Neg Hx   . Esophageal cancer Neg Hx   . Stomach cancer Neg Hx   . Rectal cancer Neg Hx     DRUG ALLERGIES:   Allergies  Allergen Reactions  . No Known Allergies     REVIEW OF SYSTEMS:   ROS As per history of present illness. All pertinent systems were reviewed above. Constitutional, HEENT, cardiovascular, respiratory, GI, GU, musculoskeletal, neuro, psychiatric, endocrine, integumentary and hematologic systems were reviewed and are otherwise negative/unremarkable except for positive findings mentioned above in the HPI.   MEDICATIONS AT HOME:   Prior to Admission medications   Medication Sig Start Date End Date Taking? Authorizing Provider  amLODipine (NORVASC) 2.5 MG tablet Take 1 tablet by mouth Daily.  01/01/12   [provider]  cholecalciferol (VITAMIN D3) 25 MCG (1000 UNIT) tablet Take 1,000 Units by mouth daily.    [provider]  Multiple Vitamin (MULTIVITAMIN ADULT PO) daily.    [provider]  pravastatin (PRAVACHOL) 20 MG tablet Take 20 mg by mouth daily.  03/26/15   [provider]  vitamin C (ASCORBIC ACID) 250 MG tablet Take 250 mg by mouth daily.    [provider]  Vitamin D, Ergocalciferol, (DRISDOL) 1.25 MG (50000 UNIT) CAPS capsule Take 1 capsule (50,000 Units total) by mouth every 7 (seven) days. 09/14/20   Opalski, Neoma Laming, DO      VITAL SIGNS:  Blood pressure 139/68, pulse 68, temperature 98.2 F (36.8 C), temperature source Oral, resp. rate 17, height 5\' 3"  (1.6 m), weight 74.8 kg, SpO2 99 %.  PHYSICAL EXAMINATION:  Physical Exam  GENERAL:  65 y.o.-year-old Caucasian female patient lying in the bed with no acute distress.  EYES: Pupils equal, round, reactive to light and accommodation. No scleral icterus. Extraocular  muscles intact.  HEENT: Head atraumatic, normocephalic. Oropharynx and nasopharynx clear.  NECK:  Supple, no jugular venous distention. No thyroid enlargement, no tenderness.  LUNGS: Normal breath sounds bilaterally, no wheezing, rales,rhonchi or crepitation. No use of accessory muscles of respiration.  CARDIOVASCULAR: Regular rate and rhythm, S1, S2 normal. No murmurs, rubs, or gallops.  ABDOMEN: Soft, nondistended, nontender. Bowel sounds present. No organomegaly or mass.  EXTREMITIES: No pedal edema, cyanosis, or clubbing.  Left knee in immobilizer. NEUROLOGIC: Cranial nerves II through XII are intact. Muscle strength 5/5 in all extremities. Sensation intact. Gait not checked.  PSYCHIATRIC: The patient is alert and oriented x 3.  Normal affect and good eye contact. SKIN: No obvious rash, lesion, or ulcer.   LABORATORY PANEL:   CBC Recent Labs  Lab 05/24/21 0334  WBC 9.2  HGB 13.4  HCT 38.4  PLT 160   ------------------------------------------------------------------------------------------------------------------  Chemistries  Recent Labs  Lab 05/24/21 0154 05/24/21 0334  NA 137  --   K 3.8  --   CL 110  --   CO2 21*  --   GLUCOSE 129*  --   BUN 18  --   CREATININE 0.88  --   CALCIUM 7.7*  --   AST  --  22  ALT  --  22  ALKPHOS  --  78  BILITOT  --  0.5   ------------------------------------------------------------------------------------------------------------------  Cardiac Enzymes No results for input(s): TROPONINI in the last 168 hours. ------------------------------------------------------------------------------------------------------------------  RADIOLOGY:  DG Tibia/Fibula Left  Result Date: 05/23/2021 CLINICAL DATA:  Fall EXAM: LEFT TIBIA AND FIBULA - 2 VIEW COMPARISON:  Knee radiograph 05/23/2021 FINDINGS: Mid to distal tibia appear intact. Highly comminuted intra-articular fracture involving proximal tibia. Large lipohemarthrosis. IMPRESSION: Highly  comminuted intra-articular fracture involving the proximal tibia with large lipohemarthrosis Electronically Signed   By: Donavan Foil M.D.   On: 05/23/2021 23:43   CT Head Wo Contrast  Result Date: 05/24/2021 CLINICAL DATA:  Fall while dancing, low platelets, EXAM: CT HEAD WITHOUT CONTRAST TECHNIQUE: Contiguous axial images were obtained from the base of the skull through the vertex without intravenous contrast. COMPARISON:  None. FINDINGS: Brain: No evidence of acute infarction, hemorrhage, hydrocephalus, extra-axial collection. Question a rounded density along the left cerebellar pontine angle, could reflect a small meningioma. No other visible or suspected mass. No midline shift. Basal cisterns are patent. Patchy areas of white matter hypoattenuation are most compatible with chronic microvascular angiopathy. Symmetric prominence of the ventricles, cisterns and sulci compatible with parenchymal volume loss. Partially empty sella. Vascular: Atherosclerotic calcification of the carotid siphons. No hyperdense vessel. Skull: No calvarial fracture or suspicious osseous lesion. No scalp swelling or hematoma. Sinuses/Orbits: Paranasal sinuses and mastoid air cells are predominantly clear. Included orbital structures are unremarkable. Other: None IMPRESSION: No significant scalp swelling or calvarial fracture. No acute intracranial hemorrhage or other traumatic findings. Questionable ovoid mass along the left cerebellopontine angle measuring 1.4 x 0.6 x 1 cm in size, possible meningioma. Outpatient MRI with and without contrast could be confirmatory. Partially empty sella. Electronically Signed   By: Lovena Le M.D.   On: 05/24/2021 05:00   CT Knee Left Wo Contrast  Result Date: 05/24/2021 CLINICAL DATA:  Tibial plateau fracture EXAM: CT OF THE LEFT KNEE WITHOUT CONTRAST TECHNIQUE: Multidetector CT imaging of the left knee was performed according to the standard protocol. Multiplanar CT image reconstructions  were also generated. COMPARISON:  Plain films 05/23/2021 FINDINGS: Complex, comminuted fracture noted through the proximal tibia involving the tibial spines chest and extending into both tibial plateaus. There is a large medial fragment which is displaced medially. The femur is also displaced medially relative to the lateral tibial plateau. The fracture extends distally into the tibial shaft. Lipohemarthrosis noted. IMPRESSION: Complex comminuted proximal tibial fracture involving the tibial spines, both tibial plateaus and extending into the tibial shaft. Medial displacement of the medial tibial plateau and femur. Electronically Signed   By: Rolm Baptise M.D.   On: 05/24/2021 02:19   DG Knee Left Port  Result Date: 05/23/2021 CLINICAL DATA:  Fall, deformity, swelling EXAM: PORTABLE LEFT  KNEE - 1-2 VIEW COMPARISON:  None. FINDINGS: There is a proximal tibial fracture extending into the knee joint at the tibial spines. This extends into the metaphysis with a linear extension into the proximal diaphysis. Significant displacement of the medial tibial fragment. Joint effusion with lipohemarthrosis. IMPRESSION: Displaced proximal tibial fracture extending from the tibial spines distally into the metadiaphysis. Electronically Signed   By: Rolm Baptise M.D.   On: 05/23/2021 22:31   DG Hip Unilat With Pelvis 2-3 Views Left  Result Date: 05/23/2021 CLINICAL DATA:  Fall with hip pain EXAM: DG HIP (WITH OR WITHOUT PELVIS) 2-3V LEFT COMPARISON:  None. FINDINGS: SI joints are non widened. Pubic symphysis and rami appear intact. No fracture or malalignment. IMPRESSION: No acute osseous abnormality Electronically Signed   By: Donavan Foil M.D.   On: 05/23/2021 23:42      IMPRESSION AND PLAN:  Active Problems:   Closed bicondylar fracture of left tibial plateau  1.  Left tibial plateau fracture secondary to mechanical fall. - The patient will be admitted to a medical bed. - Pain management will be provided. -  Orthopedic consultation will be obtained. - Dr. Wynelle Link was notified about the patient. - We will keep her in her knee immobilizer. - PT consultation can be obtained after orthopedic evaluation.  2.  Essential hypertension. - We will continue Norvasc.  3.  Dyslipidemia. - We will continue statin therapy.  4.  Vitamin D deficiency. - We will continue vitamin D.  5.  Peripheral neuropathy. - We will continue Neurontin.   DVT prophylaxis: Lovenox. Code Status: full code. Family Communication:  The plan of care was discussed in details with the patient (and family). I answered all questions. The patient agreed to proceed with the above mentioned plan. Further management will depend upon hospital course. Disposition Plan: Back to previous home environment Consults called: Orthopedic consultation. All the records are reviewed and case discussed with ED provider.  Status is: Inpatient  Remains inpatient appropriate because:Ongoing active pain requiring inpatient pain management, Ongoing diagnostic testing needed not appropriate for outpatient work up, Unsafe d/c plan, IV treatments appropriate due to intensity of illness or inability to take PO, and Inpatient level of care appropriate due to severity of illness  Dispo: The patient is from: Home              Anticipated d/c is to: Home              Patient currently is not medically stable to d/c.   Difficult to place patient No   TOTAL TIME TAKING CARE OF THIS PATIENT: 55 minutes.    Christel Mormon M.D on 05/24/2021 at 6:54 AM  [facility]   From 7 PM-7 AM, contact night-coverage [email or web address]  CC: Primary care physician; Leanna Battles, MD

## 2021-05-24 NOTE — ED Notes (Signed)
Pt updated to plan of care. All questions addressed, will continue to monitor.

## 2021-05-24 NOTE — ED Notes (Signed)
Pt assisted to change purewick, remove undergarments.  Pt placed on new purewick, brief, clean disposable chuck underneath, clean top sheet. Ice pack given for knee. Ni further requests.  Pt able to lift bottom with one leg and hands to change pad.

## 2021-05-24 NOTE — Progress Notes (Signed)
Consult request has been received from Dr. Hector Shade for left bicondylar fracture dislocation.  Patient presented to [facility] ED after dancing injury. Because of prior relationship with [facility] Dr. Hector Shade was consulted. Work up revealed a complex fracture pattern with the appearance of high energy trauma and high risk of complications. Given the location and complexity of this plateau fracture, Dr. Maureen Ralphs asserted this was outside his scope of practice and that it would be in the best interest of the patient to have these injuries evaluated and treated by a fellowship trained orthopaedic traumatologist. Consequently, my practice was consulted to provide further evaluation and management. I am out of the hospital and unable to care for the patient at this time but have made arrangements with my partner, Dr. Katha Hamming, to provide further evaluation and management beginning tomorrow.    Altamese Normandy, MD [facility], [facility] [phone]

## 2021-05-24 NOTE — Progress Notes (Signed)
Orthopedic Tech Progress Note Patient Details:  Julie Moran Jul 12, 1956 [id number]         [name] 05/24/2021, 5:10 PMPadding for knee

## 2021-05-24 NOTE — Progress Notes (Signed)
Brief same-day note  Patient is a 65 year old female with history of hypertension who presented to the emergency department with complaints of acute onset of left knee pain and inability to ambulate after she had an accidental mechanical fall while she was dancing.  Left knee CT showed complex comminuted proximal tibial fracture involving the tibial spines, both tibial pedis and extending into the tibial side.  She has been admitted for fracture management.  Orthopedics consulted.  We discussed with [facility] today.  We will continue pain management, supportive care.

## 2021-05-24 NOTE — ED Notes (Signed)
Pt provided pain medication, saltine crackers and peanut butter. No further needs expressed.

## 2021-05-25 ENCOUNTER — Encounter (HOSPITAL_COMMUNITY): Payer: Self-pay | Admitting: Family Medicine

## 2021-05-25 ENCOUNTER — Inpatient Hospital Stay (HOSPITAL_COMMUNITY): Payer: Medicare PPO | Admitting: Anesthesiology

## 2021-05-25 ENCOUNTER — Inpatient Hospital Stay (HOSPITAL_COMMUNITY): Payer: Medicare PPO

## 2021-05-25 ENCOUNTER — Encounter (HOSPITAL_COMMUNITY)
Admission: EM | Disposition: A | Discharge status: Home / Self Care | Payer: Self-pay | Source: Home / Self Care | Attending: Internal Medicine

## 2021-05-25 DIAGNOSIS — S82142A Displaced bicondylar fracture of left tibia, initial encounter for closed fracture: Secondary | ICD-10-CM | POA: Diagnosis present

## 2021-05-25 HISTORY — PX: EXTERNAL FIXATION LEG: SHX1549

## 2021-05-25 LAB — BASIC METABOLIC PANEL
Anion gap: 4 — ABNORMAL LOW (ref 5–15)
BUN: 8 mg/dL (ref 8–23)
CO2: 29 mmol/L (ref 22–32)
Calcium: 8.5 mg/dL — ABNORMAL LOW (ref 8.9–10.3)
Chloride: 107 mmol/L (ref 98–111)
Creatinine, Ser: 0.76 mg/dL (ref 0.44–1.00)
GFR, Estimated: >60 mL/min (ref >60)
Glucose, Bld: 147 mg/dL — ABNORMAL HIGH (ref 70–99)
Potassium: 3.7 mmol/L (ref 3.5–5.1)
Sodium: 140 mmol/L (ref 135–145)

## 2021-05-25 LAB — CBC
HCT: 37.8 % (ref 36.0–46.0)
HCT: 38 % (ref 36.0–46.0)
Hemoglobin: 13.1 g/dL (ref 12.0–15.0)
Hemoglobin: 13.5 g/dL (ref 12.0–15.0)
MCH: 30.7 pg (ref 26.0–34.0)
MCH: 31.3 pg (ref 26.0–34.0)
MCHC: 34.7 g/dL (ref 30.0–36.0)
MCHC: 35.5 g/dL (ref 30.0–36.0)
MCV: 88.5 fL (ref 80.0–100.0)
MCV: 88.2 fL (ref 80.0–100.0)
Platelets: 151 10*3/uL (ref 150–400)
Platelets: 148 10*3/uL — ABNORMAL LOW (ref 150–400)
RBC: 4.27 MIL/uL (ref 3.87–5.11)
RBC: 4.31 MIL/uL (ref 3.87–5.11)
RDW: 12.9 % (ref 11.5–15.5)
RDW: 12.6 % (ref 11.5–15.5)
WBC: 7.9 10*3/uL (ref 4.0–10.5)
WBC: 9.3 10*3/uL (ref 4.0–10.5)
nRBC: 0 % (ref 0.0–0.2)
nRBC: 0 % (ref 0.0–0.2)

## 2021-05-25 LAB — HIV ANTIBODY (ROUTINE TESTING W REFLEX): HIV Screen 4th Generation wRfx: NONREACTIVE

## 2021-05-25 LAB — CREATININE, SERUM
Creatinine, Ser: 0.8 mg/dL (ref 0.44–1.00)
GFR, Estimated: >60 mL/min (ref >60)

## 2021-05-25 SURGERY — EXTERNAL FIXATION, LOWER EXTREMITY
Anesthesia: Regional | Laterality: Left

## 2021-05-25 MED ORDER — METHOCARBAMOL 500 MG PO TABS
500.0000 mg | ORAL_TABLET | Freq: Four times a day (QID) | ORAL | Status: DC | PRN
  Administered 2021-05-26: 500 mg via ORAL
  Filled 2021-05-25: qty 1

## 2021-05-25 MED ORDER — ROCURONIUM BROMIDE 10 MG/ML (PF) SYRINGE
PREFILLED_SYRINGE | INTRAVENOUS | Status: AC
  Filled 2021-05-25: qty 10

## 2021-05-25 MED ORDER — METOCLOPRAMIDE HCL 5 MG PO TABS: 5.0000 mg | ORAL_TABLET | Freq: Three times a day (TID) | ORAL | Status: DC | PRN

## 2021-05-25 MED ORDER — DOCUSATE SODIUM 100 MG PO CAPS
100.0000 mg | ORAL_CAPSULE | Freq: Two times a day (BID) | ORAL | Status: DC
  Administered 2021-05-25 – 2021-05-28 (×6): 100 mg via ORAL
  Filled 2021-05-25 (×6): qty 1

## 2021-05-25 MED ORDER — ORAL CARE MOUTH RINSE: 15.0000 mL | Freq: Once | OROMUCOSAL | Status: AC

## 2021-05-25 MED ORDER — VANCOMYCIN HCL 1000 MG IV SOLR
INTRAVENOUS | Status: DC | PRN
  Administered 2021-05-25: 1000 mg

## 2021-05-25 MED ORDER — CEFAZOLIN SODIUM-DEXTROSE 2-4 GM/100ML-% IV SOLN
2.0000 g | Freq: Once | INTRAVENOUS | Status: AC
  Administered 2021-05-25: 2 g via INTRAVENOUS

## 2021-05-25 MED ORDER — OXYCODONE HCL 5 MG PO TABS: 10.0000 mg | ORAL_TABLET | ORAL | Status: DC | PRN

## 2021-05-25 MED ORDER — ONDANSETRON HCL 4 MG/2ML IJ SOLN
4.0000 mg | Freq: Once | INTRAMUSCULAR | Status: AC
  Administered 2021-05-25: 4 mg via INTRAVENOUS

## 2021-05-25 MED ORDER — POLYETHYLENE GLYCOL 3350 17 G PO PACK: 17.0000 g | PACK | Freq: Every day | ORAL | Status: DC | PRN

## 2021-05-25 MED ORDER — PROPOFOL 10 MG/ML IV BOLUS
INTRAVENOUS | Status: DC | PRN
  Administered 2021-05-25: 130 mg via INTRAVENOUS

## 2021-05-25 MED ORDER — VANCOMYCIN HCL 1000 MG IV SOLR
INTRAVENOUS | Status: AC
  Filled 2021-05-25: qty 1000

## 2021-05-25 MED ORDER — LACTATED RINGERS IV SOLN: INTRAVENOUS | Status: DC

## 2021-05-25 MED ORDER — HYDROMORPHONE HCL 1 MG/ML IJ SOLN
0.5000 mg | INTRAMUSCULAR | Status: DC | PRN
  Administered 2021-05-26 – 2021-05-27 (×5): 1 mg via INTRAVENOUS
  Filled 2021-05-25 (×5): qty 1

## 2021-05-25 MED ORDER — MIDAZOLAM HCL 2 MG/2ML IJ SOLN
INTRAMUSCULAR | Status: AC
  Administered 2021-05-25: 1 mg via INTRAVENOUS
  Filled 2021-05-25: qty 2

## 2021-05-25 MED ORDER — CHLORHEXIDINE GLUCONATE 0.12 % MT SOLN: 15.0000 mL | Freq: Once | OROMUCOSAL | Status: AC

## 2021-05-25 MED ORDER — LIDOCAINE HCL (PF) 2 % IJ SOLN
INTRAMUSCULAR | Status: DC | PRN
  Administered 2021-05-25: 60 mg via INTRADERMAL

## 2021-05-25 MED ORDER — CEFAZOLIN SODIUM-DEXTROSE 2-4 GM/100ML-% IV SOLN
INTRAVENOUS | Status: AC
  Filled 2021-05-25: qty 100

## 2021-05-25 MED ORDER — METOCLOPRAMIDE HCL 5 MG/ML IJ SOLN: 5.0000 mg | Freq: Three times a day (TID) | INTRAMUSCULAR | Status: DC | PRN

## 2021-05-25 MED ORDER — EPHEDRINE 5 MG/ML INJ
INTRAVENOUS | Status: AC
  Filled 2021-05-25: qty 10

## 2021-05-25 MED ORDER — PROPOFOL 10 MG/ML IV BOLUS
INTRAVENOUS | Status: AC
  Filled 2021-05-25: qty 20

## 2021-05-25 MED ORDER — 0.9 % SODIUM CHLORIDE (POUR BTL) OPTIME
TOPICAL | Status: DC | PRN
  Administered 2021-05-25: 1000 mL

## 2021-05-25 MED ORDER — CEFAZOLIN SODIUM-DEXTROSE 2-4 GM/100ML-% IV SOLN
2.0000 g | Freq: Three times a day (TID) | INTRAVENOUS | Status: AC
  Administered 2021-05-25 – 2021-05-26 (×3): 2 g via INTRAVENOUS
  Filled 2021-05-25 (×3): qty 100

## 2021-05-25 MED ORDER — ONDANSETRON HCL 4 MG/2ML IJ SOLN: 4.0000 mg | Freq: Four times a day (QID) | INTRAMUSCULAR | Status: DC | PRN

## 2021-05-25 MED ORDER — ONDANSETRON HCL 4 MG/2ML IJ SOLN
INTRAMUSCULAR | Status: DC | PRN
  Administered 2021-05-25: 4 mg via INTRAVENOUS

## 2021-05-25 MED ORDER — FENTANYL CITRATE (PF) 100 MCG/2ML IJ SOLN
INTRAMUSCULAR | Status: AC
  Filled 2021-05-25: qty 2

## 2021-05-25 MED ORDER — MIDAZOLAM HCL 2 MG/2ML IJ SOLN
INTRAMUSCULAR | Status: AC
  Filled 2021-05-25: qty 2

## 2021-05-25 MED ORDER — METHOCARBAMOL 1000 MG/10ML IJ SOLN
500.0000 mg | Freq: Four times a day (QID) | INTRAVENOUS | Status: DC | PRN
  Filled 2021-05-25: qty 5

## 2021-05-25 MED ORDER — FENTANYL CITRATE (PF) 250 MCG/5ML IJ SOLN
INTRAMUSCULAR | Status: AC
  Filled 2021-05-25: qty 5

## 2021-05-25 MED ORDER — DEXAMETHASONE SODIUM PHOSPHATE 10 MG/ML IJ SOLN
INTRAMUSCULAR | Status: DC | PRN
  Administered 2021-05-25: 10 mg

## 2021-05-25 MED ORDER — ONDANSETRON HCL 4 MG PO TABS: 4.0000 mg | ORAL_TABLET | Freq: Four times a day (QID) | ORAL | Status: DC | PRN

## 2021-05-25 MED ORDER — ENOXAPARIN SODIUM 40 MG/0.4ML IJ SOSY
40.0000 mg | PREFILLED_SYRINGE | INTRAMUSCULAR | Status: DC
  Administered 2021-05-26 – 2021-05-28 (×3): 40 mg via SUBCUTANEOUS
  Filled 2021-05-25 (×3): qty 0.4

## 2021-05-25 MED ORDER — CHLORHEXIDINE GLUCONATE 0.12 % MT SOLN
OROMUCOSAL | Status: AC
  Administered 2021-05-25: 15 mL via OROMUCOSAL
  Filled 2021-05-25: qty 15

## 2021-05-25 MED ORDER — FENTANYL CITRATE (PF) 100 MCG/2ML IJ SOLN
50.0000 ug | Freq: Once | INTRAMUSCULAR | Status: AC
  Administered 2021-05-25: 50 ug via INTRAVENOUS

## 2021-05-25 MED ORDER — ROPIVACAINE HCL 5 MG/ML IJ SOLN
INTRAMUSCULAR | Status: DC | PRN
  Administered 2021-05-25: 40 mL via PERINEURAL

## 2021-05-25 MED ORDER — OXYCODONE HCL 5 MG PO TABS: 5.0000 mg | ORAL_TABLET | ORAL | Status: DC | PRN

## 2021-05-25 MED ORDER — EPHEDRINE SULFATE-NACL 50-0.9 MG/10ML-% IV SOSY
PREFILLED_SYRINGE | INTRAVENOUS | Status: DC | PRN
  Administered 2021-05-25: 15 mg via INTRAVENOUS

## 2021-05-25 MED ORDER — MIDAZOLAM HCL 2 MG/2ML IJ SOLN: 1.0000 mg | Freq: Once | INTRAMUSCULAR | Status: AC

## 2021-05-25 MED ORDER — ONDANSETRON HCL 4 MG/2ML IJ SOLN
INTRAMUSCULAR | Status: AC
  Filled 2021-05-25: qty 2

## 2021-05-25 SURGICAL SUPPLY — 78 items
ADH SKN CLS APL DERMABOND .7 (GAUZE/BANDAGES/DRESSINGS) ×1
APL PRP STRL LF DISP 70% ISPRP (MISCELLANEOUS) ×2
BANDAGE ESMARK 6X9 LF (GAUZE/BANDAGES/DRESSINGS) IMPLANT
BIT DRILL CALIB QC 170X80 (BIT) ×1 IMPLANT
BIT DRILL QC SFS 2.5X170 (BIT) ×1 IMPLANT
BNDG CMPR 9X6 STRL LF SNTH (GAUZE/BANDAGES/DRESSINGS) ×1
BNDG COHESIVE 4X5 TAN STRL (GAUZE/BANDAGES/DRESSINGS) ×2 IMPLANT
BNDG ELASTIC 4X5.8 VLCR STR LF (GAUZE/BANDAGES/DRESSINGS) ×2 IMPLANT
BNDG ELASTIC 6X5.8 VLCR STR LF (GAUZE/BANDAGES/DRESSINGS) ×2 IMPLANT
BNDG ESMARK 6X9 LF (GAUZE/BANDAGES/DRESSINGS) ×2
BNDG GAUZE ELAST 4 BULKY (GAUZE/BANDAGES/DRESSINGS) ×2 IMPLANT
BRUSH SCRUB EZ PLAIN DRY (MISCELLANEOUS) ×4 IMPLANT
CHLORAPREP W/TINT 26 (MISCELLANEOUS) ×3 IMPLANT
COVER SURGICAL LIGHT HANDLE (MISCELLANEOUS) ×4 IMPLANT
COVER WAND RF STERILE (DRAPES) ×1 IMPLANT
DERMABOND ADVANCED (GAUZE/BANDAGES/DRESSINGS) ×1
DERMABOND ADVANCED .7 DNX12 (GAUZE/BANDAGES/DRESSINGS) IMPLANT
DRAPE C-ARM 42X72 X-RAY (DRAPES) IMPLANT
DRAPE C-ARMOR (DRAPES) ×2 IMPLANT
DRAPE IMP U-DRAPE 54X76 (DRAPES) ×4 IMPLANT
DRAPE ORTHO SPLIT 77X108 STRL (DRAPES) ×4
DRAPE SURG ORHT 6 SPLT 77X108 (DRAPES) ×2 IMPLANT
DRAPE U-SHAPE 47X51 STRL (DRAPES) ×3 IMPLANT
DRESSING MEPILEX FLEX 4X4 (GAUZE/BANDAGES/DRESSINGS) IMPLANT
DRSG MEPILEX BORDER 4X8 (GAUZE/BANDAGES/DRESSINGS) ×2 IMPLANT
DRSG MEPILEX FLEX 4X4 (GAUZE/BANDAGES/DRESSINGS) ×2
DRSG MEPITEL 4X7.2 (GAUZE/BANDAGES/DRESSINGS) IMPLANT
ELECT REM PT RETURN 9FT ADLT (ELECTROSURGICAL) ×2
ELECTRODE REM PT RTRN 9FT ADLT (ELECTROSURGICAL) ×1 IMPLANT
GAUZE SPONGE 4X4 12PLY STRL (GAUZE/BANDAGES/DRESSINGS) ×2 IMPLANT
GLOVE BIO SURGEON STRL SZ 6.5 (GLOVE) ×6 IMPLANT
GLOVE BIO SURGEON STRL SZ7.5 (GLOVE) ×8 IMPLANT
GLOVE BIOGEL PI IND STRL 7.5 (GLOVE) ×1 IMPLANT
GLOVE BIOGEL PI INDICATOR 7.5 (GLOVE) ×1
GLOVE SURG UNDER POLY LF SZ6.5 (GLOVE) ×2 IMPLANT
GOWN STRL REUS W/ TWL LRG LVL3 (GOWN DISPOSABLE) ×2 IMPLANT
GOWN STRL REUS W/TWL LRG LVL3 (GOWN DISPOSABLE) ×4
KIT BASIN OR (CUSTOM PROCEDURE TRAY) ×2 IMPLANT
KIT TURNOVER KIT B (KITS) ×2 IMPLANT
MANIFOLD NEPTUNE II (INSTRUMENTS) ×2 IMPLANT
NDL 1/2 CIR CATGUT .05X1.09 (NEEDLE) IMPLANT
NEEDLE 1/2 CIR CATGUT .05X1.09 (NEEDLE) ×2 IMPLANT
NEEDLE 22X1 1/2 (OR ONLY) (NEEDLE) IMPLANT
NS IRRIG 1000ML POUR BTL (IV SOLUTION) ×1 IMPLANT
PACK ORTHO EXTREMITY (CUSTOM PROCEDURE TRAY) ×2 IMPLANT
PAD ARMBOARD 7.5X6 YLW CONV (MISCELLANEOUS) ×4 IMPLANT
PAD CAST 4YDX4 CTTN HI CHSV (CAST SUPPLIES) IMPLANT
PADDING CAST COTTON 4X4 STRL (CAST SUPPLIES) ×2
PADDING CAST COTTON 6X4 STRL (CAST SUPPLIES) ×3 IMPLANT
PLATE PROX TIBIA LEFT 8H (Plate) ×1 IMPLANT
PROS LCP PLATE 6H 85MM (Plate) ×2 IMPLANT
PROSTHESIS LCP PLATE 6H 85MM (Plate) IMPLANT
SCREW CORTEX 3.5 26MM (Screw) ×1 IMPLANT
SCREW CORTEX 3.5 28MM (Screw) ×3 IMPLANT
SCREW CORTEX 3.5 34MM (Screw) ×1 IMPLANT
SCREW CORTEX 3.5 45MM (Screw) ×1 IMPLANT
SCREW CORTEX 3.5 55MM (Screw) ×1 IMPLANT
SCREW CORTEX 3.5X75MM (Screw) ×1 IMPLANT
SCREW LOCK CORT ST 3.5X26 (Screw) IMPLANT
SCREW LOCK CORT ST 3.5X28 (Screw) IMPLANT
SCREW LOCK CORT ST 3.5X34 (Screw) IMPLANT
SCREW LOCK T15 FT 55X3.5X2.9X (Screw) IMPLANT
SCREW LOCKING 3.5X45 (Screw) ×1 IMPLANT
SCREW LOCKING 3.5X55 (Screw) ×2 IMPLANT
SCREW LOCKING 3.5X70MM VA (Screw) ×1 IMPLANT
SCREW LOCKING VA 3.5X75MM (Screw) ×2 IMPLANT
SCREW VA-LOCKING 65MM 3.5 (Screw) ×1 IMPLANT
SPONGE LAP 18X18 RF (DISPOSABLE) ×2 IMPLANT
STAPLER VISISTAT 35W (STAPLE) ×2 IMPLANT
STRIP CLOSURE SKIN 1/2X4 (GAUZE/BANDAGES/DRESSINGS) IMPLANT
SUT PROLENE 0 CT (SUTURE) IMPLANT
SUT VIC AB 1 CT1 18XCR BRD 8 (SUTURE) IMPLANT
SUT VIC AB 1 CT1 8-18 (SUTURE) ×2
SUT VIC AB 2-0 CT1 27 (SUTURE) ×2
SUT VIC AB 2-0 CT1 TAPERPNT 27 (SUTURE) IMPLANT
TOWEL GREEN STERILE (TOWEL DISPOSABLE) ×4 IMPLANT
TOWEL GREEN STERILE FF (TOWEL DISPOSABLE) ×4 IMPLANT
UNDERPAD 30X36 HEAVY ABSORB (UNDERPADS AND DIAPERS) ×2 IMPLANT

## 2021-05-25 NOTE — Op Note (Signed)
Orthopaedic Surgery Operative Note (CSN: [id number] ) Date of Surgery: 05/25/2021  Admit Date: 05/23/2021   Diagnoses: Pre-Op Diagnoses: Left bicondylar tibial plateau fracture  Post-Op Diagnosis: Same  Procedures: CPT 17510-CHEN reduction internal fixation left tibial plateau fracture CPT 27540-Open reduction internal fixation of left tibial tuberosities CPT 27552-Closed reduction of left knee dislocation  Surgeons : Primary: Eashan Schipani, Thomasene Lot, MD  Assistant: Izola Price, RNFA  Location: OR 3   Anesthesia:General with regional anesthesia  Antibiotics: Ancef 2g preop with 1 gm vancomycin powder placed topically   Tourniquet time: Total Tourniquet Time Documented: Thigh (Left) - 90 minutes Total: Thigh (Left) - 90 minutes   Estimated Blood Loss: 50 mL  Complications:None  Specimens:None   Implants: Implant Name Type Inv. Item Serial No. Manufacturer Lot No. LRB No. Used Action  PROS LCP PLATE 6H 27PO - [id number] Plate PROS LCP PLATE 6H [id number]  [employer]  Left 1 Implanted  SCREW CORTEX 3.5 45MM - [id number] Screw SCREW CORTEX 3.5 45MM  [employer]  Left 1 Implanted  SCREW CORTEX 3.5 34MM - [id number] Screw SCREW CORTEX 3.5 34MM  [employer]  Left 1 Implanted  SCREW CORTEX 3.5 55MM - [id number] Screw SCREW CORTEX 3.5 55MM  [employer]  Left 1 Implanted  PLATE PROX TIBIA LEFT 8H - [id number] Plate PLATE PROX TIBIA LEFT 8H  [employer]  Left 1 Implanted  SCREW CORTEX 3.5X75MM - [id number] Screw SCREW CORTEX 3.5X75MM  [employer]  Left 2 Implanted  SCREW CORTEX 3.5 28MM - [id number] Screw SCREW CORTEX 3.5 28MM  [employer]  Left 3 Implanted  SCREW CORTEX 3.5 26MM - [id number] Screw SCREW CORTEX 3.5 26MM  [employer]  Left 1 Implanted  SCREW LOCKING VA 3.5X75MM - [id number] Screw SCREW LOCKING VA 3.5X75MM  [employer]  Left 1 Implanted  SCREW LOCKING 3.5X70MM VA - [id number] Screw SCREW LOCKING 3.5X70MM VA  [employer]   Left 1 Implanted  SCREW LOCKING 3.5X55 - [id number] Screw SCREW LOCKING 3.5X55  [employer]  Left 1 Implanted  SCREW LOCKING 3.5X45 - [id number] Screw SCREW LOCKING 3.5X45  [employer]  Left 1 Implanted  SCREW LOCKING 3.5X45 - [id number] Screw SCREW LOCKING 3.5X45  [employer]  Left 1 Implanted  SCREW VA-LOCKING 65MM 3.5 - [id number] Screw SCREW VA-LOCKING 65MM 3.5  [employer]  Left 1 Implanted     Indications for Surgery: 65 year old female who sustained a left bicondylar tibial plateau fracture dislocation.  Due to the unstable nature of her injury I recommended proceeding with open reduction internal fixation.  Risks and benefits were discussed with the patient.  Risks include but not limited to bleeding, infection, malunion, nonunion, hardware failure, hardware irritation, nerve or blood vessel injury, posttraumatic arthritis, knee stiffness, DVT, even the possibility anesthetic complications.  Patient agreed to proceed with surgery and consent was obtained.  Operative Findings: 1.  Close reduction of left knee dislocation 2.  Open reduction internal fixation of left bicondylar tibial plateau fracture and tibial tuberosities fractures using Synthes VA 3.5 mm LCP proximal tibial locking plate and a 3.5 mm LCP 6-hole plate for a medial buttress  Procedure: The patient was identified in the preoperative holding area. Consent was confirmed with the patient and their family and all questions were answered. The operative extremity was marked after confirmation with the patient. she was then brought back to the operating room by our anesthesia colleagues.  She was carefully transferred to a radiolucent flat top table.  She was placed under general anesthetic.  A tourniquet was placed to the upper thigh.  The left lower extremity was then prepped and draped in usual sterile fashion.  A timeout was performed to verify the patient, the procedure, and the extremity.  Preoperative  antibiotics were dosed.  Fluoroscopic imaging was obtained to show the unstable nature of her injury.  Her femoral condyles were still dislocated into her fracture site with the significant displacement and shortening of the medial condyle of the proximal tibia.  Reduction maneuver was performed with traction and medial force along the femur.  I was able to reduce the femoral condyles in place.  Unfortunately there was a small fragment of the joint that displaced laterally with the reduction maneuver.  At this point I felt that a medial buttress plate would support the medial column.  I felt that a 2 incision approach was most appropriate.    A direct medial approach to the proximal tibia was carried down through skin and subcutaneous tissue.  I carefully dissected through the subcutaneous tissue and incised through the crural fascia.  I then exposed the hamstring tendons and mobilized these to visualize the underlying MCL.  This was all intact.  I then mobilized the gastroc off of the posterior tibia.  I visualized the fracture and was able to feel a cortical read along the medial cortex.  At this point I then turned my attention to the lateral side.  I used an Geographical information systems officer to exsanguinate the extremity.  I the tourniquet was inflated to the 300 mmHg.  Total tourniquet time as noted above.  A lateral parapatellar approach was carried down through skin and subcutaneous tissue.  The IT band was incised in line with the incision.  I then mobilized the interval between the capsule and the IT band.  I released the IT band off the proximal tibia to expose the lateral condyle.  I then performed a Sze meniscal arthrotomy with a 15 blade.  Here encountered a free osteochondral fragment from the articular surface.  I placed this on the back table in some saline for later repair.  I tagged the capsule for later repair back down to the plate.  There was no meniscus tear visualized.  I then proceeded to reduce the articular  surface.  I was able to reconstruct the articular surface after disimpaction with a Cobb elevator.  I was able to place the free osteochondral fragment back into an anatomic position.  I held this provisionally with a number of K wires.  I confirmed adequate positioning with fluoroscopic guidance.  Once I had the lateral joint reduced I then reduced the lateral condyle to the medial condyle using a reduction tenaculum.  I then proceeded to use the cortical read that I had on the medial cortex to reduce the metaphysis.  I then contoured a Synthes 3.5 mm LCP plate to fit the medial cortex.  I placed the chest posterior to the MCL.  I then held it provisionally with a K wire and confirmed adequate positioning with fluoroscopy.  I then placed nonlocking screws below the fracture in a buttress fashion.  I then placed a nonlocking 3.5 millimeter screw to hold the condylar reduction from lateral to medial.  I was able to remove my clamp and then proceeded to place a 8 hole Synthes VA 3.5 mm proximal tibial locking plate along the lateral cortex and slid it submuscularly along the lateral tibia.  I held provisionally proximally with a K wire and confirmed adequate alignment on fluoroscopic  imaging.  I then placed a nonlocking screw in the proximal portion of the plate to bring it flush to bone.  I then placed percutaneous 3.5 millimeter screws through the plate to bring the distal portion of plate flush to bone.  Once I was pleased with the alignment and reduction of the plate I then proceeded to place 3.5 mm locking screws to assist with reduction of the articular surface and condyles.  Once I had fixation in the proximal portion of the lateral plate I returned to the medial plate and then proceeded to place a another nonlocking screw into the tibial shaft and placed locking screws into the medial condyle.  I returned again to the medial side and placed a medial kickstand screw to provide another point of fixation.   Lastly, I visualized my articular reduction it was anatomic and I felt that removing the K wires trying to provide screw fixation would potentially lose reduction and I felt that cutting the K wires will be most appropriate.  I then backed them out just a slight amount cut them flush to bone and then used a tamp to them and to be buried underneath the bone.  Final fluoroscopic imaging was obtained.  The incisions were copiously irrigated.  A free needle was used to tagged the capsule back to the plate.  A gram of vancomycin powder was placed into the incision.  A layer closure of 0 Vicryl for the IT band and crural fascia was used.  The skin was closed with 2-0 Vicryl and 3-0 Monocryl.  Dermabond was used to seal the skin.  Sterile dressings were placed.  The leg was wrapped in sterile cast padding and Ace wraps.  She was placed back into a knee immobilizer.  She was awoken from anesthesia and taken to the PACU in stable condition.   Post Op Plan/Instructions: Patient will be nonweightbearing to the left lower extremity.  She will be placed in knee immobilizer to be worn at night and when she is up with therapy.  She can work on active and passive range of motion as tolerated.  We will place her on Lovenox for DVT prophylaxis.  We will place her on Ancef for surgical prophylaxis.  We will have her mobilize with physical and Occupational Therapy.  I was present and performed the entire surgery.  Katha Hamming, MD [facility]

## 2021-05-25 NOTE — Anesthesia Procedure Notes (Signed)
Anesthesia Regional Block: Adductor canal block   Pre-Anesthetic Checklist: , timeout performed,  Correct Patient, Correct Site, Correct Laterality,  Correct Procedure, Correct Position, site marked,  Risks and benefits discussed,  Surgical consent,  Pre-op evaluation,  At surgeon's request and post-op pain management  Laterality: Left  Prep: Maximum Sterile Barrier Precautions used, chloraprep       Needles:  Injection technique: Single-shot  Needle Type: Echogenic Stimulator Needle     Needle Length: 9cm  Needle Gauge: 22     Additional Needles:   Procedures:,,,, ultrasound used (permanent image in chart),,    Narrative:  Start time: 05/25/2021 12:05 PM End time: 05/25/2021 12:10 PM Injection made incrementally with aspirations every 5 mL.  Performed by: Personally  Anesthesiologist: Pervis Hocking, DO  Additional Notes: Monitors applied. No increased pain on injection. No increased resistance to injection. Injection made in 5cc increments. Good needle visualization. Patient tolerated procedure well.

## 2021-05-25 NOTE — Anesthesia Procedure Notes (Signed)
Anesthesia Regional Block: Popliteal block   Pre-Anesthetic Checklist: , timeout performed,  Correct Patient, Correct Site, Correct Laterality,  Correct Procedure, Correct Position, site marked,  Risks and benefits discussed,  Surgical consent,  Pre-op evaluation,  At surgeon's request and post-op pain management  Laterality: Left  Prep: Maximum Sterile Barrier Precautions used, chloraprep       Needles:  Injection technique: Single-shot  Needle Type: Echogenic Stimulator Needle     Needle Length: 9cm  Needle Gauge: 22     Additional Needles:   Procedures:,,,, ultrasound used (permanent image in chart),,    Narrative:  Start time: 05/25/2021 12:00 PM End time: 05/25/2021 12:05 PM Injection made incrementally with aspirations every 5 mL.  Performed by: Personally  Anesthesiologist: Pervis Hocking, DO  Additional Notes: Monitors applied. No increased pain on injection. No increased resistance to injection. Injection made in 5cc increments. Good needle visualization. Patient tolerated procedure well.

## 2021-05-25 NOTE — Anesthesia Preprocedure Evaluation (Addendum)
Anesthesia Evaluation  Patient identified by MRN, date of birth, ID band Patient awake    Reviewed: Allergy & Precautions, NPO status , Patient's Chart, lab work & pertinent test results  Airway Mallampati: III  TM Distance: >3 FB Neck ROM: Full    Dental no notable dental hx. (+) Teeth Intact, Dental Advisory Given   Pulmonary  Hx blebs    Pulmonary exam normal breath sounds clear to auscultation       Cardiovascular hypertension, Pt. on medications Normal cardiovascular exam Rhythm:Regular Rate:Normal     Neuro/Psych PSYCHIATRIC DISORDERS Depression negative neurological ROS     GI/Hepatic negative GI ROS, Neg liver ROS,   Endo/Other  negative endocrine ROS  Renal/GU negative Renal ROS  negative genitourinary   Musculoskeletal L tibial plateau fx   Abdominal   Peds  Hematology negative hematology ROS (+) hct 37.8, plt 151   Anesthesia Other Findings   Reproductive/Obstetrics negative OB ROS                            Anesthesia Physical Anesthesia Plan  ASA: 3  Anesthesia Plan: General and Regional   Post-op Pain Management: GA combined w/ Regional for post-op pain   Induction: Intravenous  PONV Risk Score and Plan: 3 and Ondansetron, Propofol infusion, Treatment may vary due to age or medical condition and Midazolam  Airway Management Planned: LMA  Additional Equipment: None  Intra-op Plan:   Post-operative Plan: Extubation in OR  Informed Consent: I have reviewed the patients History and Physical, chart, labs and discussed the procedure including the risks, benefits and alternatives for the proposed anesthesia with the patient or authorized representative who has indicated his/her understanding and acceptance.     Dental advisory given  Plan Discussed with: CRNA  Anesthesia Plan Comments: (D/w Dr. Doreatha Martin requesting regional anesthesia )        Anesthesia Quick  Evaluation

## 2021-05-25 NOTE — Consult Note (Signed)
Orthopaedic Trauma Service (OTS) Consult   Patient ID: Julie Moran MRN: [id number] DOB/AGE: 1956/07/10 65 y.o.  Reason for Consult:Left tibial plateau fracture Referring Physician: Dr. Hector Shade, MD [facility]  HPI: Julie Moran is an 65 y.o. female who is being seen in consultation at the request of Dr. Wynelle Link for evaluation of left tibial plateau fracture.  She had pain following a fall where she was dancing last night she had a partner landed directly on her knee.  She had immediate pain and inability bear weight.  She was brought to [facility] emergency room where a severely displaced bicondylar tibial plateau fracture was identified.  She was admitted to the hospitalist service and [facility] was consulted.  Due to the complexity of her injury it was recommended that she be treated by an orthopedic traumatologist.  Were contacted yesterday evening hence she was transferred over this morning for open reduction internal fixation versus external fixation.  Patient was seen and evaluated in the preoperative holding area.  She is currently comfortable.  No signs of compartment syndrome.  She ambulates without assist device.  She lives in an independent living facility.  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  Her daughter lives in [place].  She does have friends that are local.  She is currently in a knee immobilizer.  She denies any numbness or tingling.  Pain with any attempted range of motion.  Past Medical History:  Diagnosis Date   Allergy    Atrophic vaginitis    Birt-Hogg-Dube syndrome    lung cysts    Birt-Hogg-Dube syndrome    Chest pain    Colon polyps    adenomatous   Constipation    Depression    Diverticulosis    Dry skin    Fatty liver    Floaters    Gall bladder disease    Glaucoma    Heart murmur    Herpes simplex    Hyperlipidemia     Hypertension    Joint pain    Lichen sclerosus    Pancreatitis    Pancreatitis    Pneumothorax, right    spontaneous   Shortness of breath    Shortness of breath on exertion    Swelling of both lower extremities    Trouble in sleeping     Past Surgical History:  Procedure Laterality Date   ABDOMINAL HYSTERECTOMY     BREAST BIOPSY  03/29/2006   BREAST BIOPSY Bilateral 06/14/1998   BREAST EXCISIONAL BIOPSY Bilateral 1999   BREAST LUMPECTOMY  1973,1992,2001   x5 times total , both breasts   CHOLECYSTECTOMY     COLONOSCOPY     2016   epidermoid cyst Right 02/2020   below Right breast   hysterctomy     abdominal   POLYPECTOMY     right ovary removed     UPPER GASTROINTESTINAL ENDOSCOPY      Family History  Problem Relation Age of Onset   Diabetes Mother    Breast cancer Mother    CVA Mother    Hypertension Mother    Heart disease Mother    Thyroid disease Mother    Cancer Mother    Diabetes Father    Prostate cancer Father    Heart disease Father    Hypertension Father    Hyperlipidemia Father    Thyroid disease Father    Cancer Father  Obesity Father    Liver disease Paternal Grandmother    Breast cancer Maternal Aunt        x 3   CVA Paternal Grandfather    Colon cancer Neg Hx    Esophageal cancer Neg Hx    Stomach cancer Neg Hx    Rectal cancer Neg Hx     Social History:  reports that she has never smoked. She has never used smokeless tobacco. She reports that she does not drink alcohol and does not use drugs.  Allergies:  Allergies  Allergen Reactions   No Known Allergies     Medications:  No current facility-administered medications on file prior to encounter.   Current Outpatient Medications on File Prior to Encounter  Medication Sig Dispense Refill   amLODipine (NORVASC) 2.5 MG tablet Take 1 tablet by mouth Daily.      Cholecalciferol (VITAMIN D) 50 MCG (2000 UT) CAPS Take 2,000 Units by mouth daily.     pravastatin (PRAVACHOL) 20 MG  tablet Take 20 mg by mouth daily.      vitamin C (ASCORBIC ACID) 250 MG tablet Take 250 mg by mouth daily. (Patient not taking: Reported on 05/24/2021)     Vitamin D, Ergocalciferol, (DRISDOL) 1.25 MG (50000 UNIT) CAPS capsule Take 1 capsule (50,000 Units total) by mouth every 7 (seven) days. (Patient not taking: Reported on 05/24/2021) 4 capsule 0     ROS: Constitutional: No fever or chills Vision: No changes in vision ENT: No difficulty swallowing CV: No chest pain Pulm: No SOB or wheezing GI: No nausea or vomiting GU: No urgency or inability to hold urine Skin: No poor wound healing Neurologic: No numbness or tingling Psychiatric: No depression or anxiety Heme: No bruising Allergic: No reaction to medications or food   Exam: Blood pressure (!) 152/63, pulse 68, temperature 98.4 F (36.9 C), temperature source Oral, resp. rate 20, height 5\' 3"  (1.6 m), weight 74.8 kg, SpO2 95 %. General: No acute distress Orientation: Awake alert and oriented x3 Mood and Affect: Cooperative and pleasant Gait: Unable to assess due to her fracture Coordination and balance: Within normal limits  Left lower extremity: Knee immobilizer is in place.  It was taken down to evaluate her swelling.  She skin wrinkles both medial and laterally.  It appears that she has plenty of ability to perform surgical incision.  She is swollen but compartments are soft compressible.  She has no pain with passive range of motion of her ankle or toes.  She has no pain with passive stretch.  She has intact motor and sensory function to her foot.  She has a warm well-perfused foot with 2+ DP pulses.   Right lower extremity: Skin without lesions. No tenderness to palpation. Full painless ROM, full strength in each muscle groups without evidence of instability.   Medical Decision Making: Data: Imaging: x-rays and CT scan show a displaced bicondylar tibial plateau fracture with subluxation and dislocation of the femoral  condyles.  Labs:  Results for orders placed or performed during the hospital encounter of 05/23/21 (from the past 24 hour(s))  Basic metabolic panel     Status: Abnormal   Collection Time: 05/25/21  3:24 AM  Result Value Ref Range   Sodium 140 135 - 145 mmol/L   Potassium 3.7 3.5 - 5.1 mmol/L   Chloride 107 98 - 111 mmol/L   CO2 29 22 - 32 mmol/L   Glucose, Bld 147 (H) 70 - 99 mg/dL   BUN 8  8 - 23 mg/dL   Creatinine, Ser 0.76 0.44 - 1.00 mg/dL   Calcium 8.5 (L) 8.9 - 10.3 mg/dL   GFR, Estimated >60 >60 mL/min   Anion gap 4 (L) 5 - 15  CBC     Status: None   Collection Time: 05/25/21  3:24 AM  Result Value Ref Range   WBC 7.9 4.0 - 10.5 K/uL   RBC 4.27 3.87 - 5.11 MIL/uL   Hemoglobin 13.1 12.0 - 15.0 g/dL   HCT 37.8 36.0 - 46.0 %   MCV 88.5 80.0 - 100.0 fL   MCH 30.7 26.0 - 34.0 pg   MCHC 34.7 30.0 - 36.0 g/dL   RDW 12.9 11.5 - 15.5 %   Platelets 151 150 - 400 K/uL   nRBC 0.0 0.0 - 0.2 %     Imaging or Labs ordered: None  Medical history and chart was reviewed and case discussed with medical provider.  Assessment/Plan: 65 year old female with significantly displaced a bicondylar tibial plateau fracture  I discussed risks and benefits with the patient.  I feel that her swelling is appropriate to proceed with open reduction internal fixation.  Risks discussed included but not limited to bleeding, infection, malunion, nonunion, hardware failure, hardware irritation, nerve or blood vessel injury, DVT, posttraumatic arthritis, knee stiffness, need for further surgery including total knee arthroplasty, even the possibility anesthetic complications.  The patient agreed to proceed with surgery.  I discussed the postoperative course with the patient as well as her daughter over the phone.  They are in understanding of the process and wish to proceed with surgery today.  Greater than 50 minutes was spent in face-to-face contact with the patient performing history and physical as well as  discussing risks and benefits, discussing case and coordination of care with Dr. Wynelle Link and Dr. Marcelino Scot, and discussing the patient's care with her daughter and friend over the phone.  Shona Needles, MD [facility] [phone] (office) [email or web address]

## 2021-05-25 NOTE — Progress Notes (Signed)
PROGRESS NOTE    Julie Moran  NKN:[id number] DOB: 1956/04/30 DOA: 05/23/2021 PCP: Leanna Battles, MD   Chief Complain:Knee pain   Brief Narrative:  Patient is a 65 year old female with history of hypertension .HLD who presented to the emergency department with complaints of acute onset of left knee pain and inability to ambulate after she had an accidental mechanical fall while she was dancing.  Left knee CT showed complex comminuted proximal tibial fracture involving the tibial spines, both tibial plateaus and extending into the tibial shaft.  She has been admitted for fracture management.  Orthopedics consulted and the plan is for ORIF.  She has been transferred to [facility] for the complexity of the fracture  Assessment & Plan:   Active Problems:   Closed bicondylar fracture of left tibial plateau  Closed bicondylar fracture of left tibial plateau: Orthopedics following.  Imaging as above.Continue SCD for DVT prophylaxis for now, continue pain management.  Nonweightbearing until orthopedic intervention/recommendation.  PT/OT evaluation after surgery.  Hypertension: Home medications restarted.  Monitor blood pressure.  Hyperlipidemia: On pravastatin at home.          DVT prophylaxis:SCD Code Status: Full Family Communication: None at bedside Status is: Inpatient  Remains inpatient appropriate because:Unsafe d/c plan  Dispo: The patient is from: Home              Anticipated d/c is to: SNF              Patient currently is not medically stable to d/c.   Difficult to place patient No     Consultants: Orthopedics  Procedures:None  Antimicrobials:  Anti-infectives (From admission, onward)    None       Subjective:  Patient seen and examined the bedside this morning.  Hemodynamically stable.  She looks more comfortable than yesterday.  Her pain is well controlled.  She was asking about the timing of the surgery.  She doesn't  have any new questions.   She has been talking to her family.   Objective: Vitals:   05/24/21 1400 05/24/21 1640 05/24/21 2052 05/25/21 0452  BP: (!) 165/78 (!) 160/73 (!) 160/61 (!) 148/79  Pulse: (!) 58 (!) 59 60 64  Resp: 19 19 18 16   Temp:  98.2 F (36.8 C) 99 F (37.2 C) 98.4 F (36.9 C)  TempSrc:  Oral Oral Oral  SpO2: 100% 100% 98% 94%  Weight:      Height:        Intake/Output Summary (Last 24 hours) at 05/25/2021 0744 Last data filed at 05/25/2021 0457 Gross per 24 hour  Intake 1435 ml  Output 1400 ml  Net 35 ml   Filed Weights   05/23/21 2206  Weight: 74.8 kg    Examination:  General exam: Overall comfortable, not in distress HEENT: PERRL Respiratory system:  no wheezes or crackles  Cardiovascular system: S1 & S2 heard, RRR.  Gastrointestinal system: Abdomen is nondistended, soft and nontender. Central nervous system: Alert and oriented Extremities: No edema, no clubbing ,no cyanosis, brace on the left lower extremity/knee immobilizer Skin: No rashes, no ulcers,no icterus      Data Reviewed: I have personally reviewed following labs and imaging studies  CBC: Recent Labs  Lab 05/24/21 0154 05/24/21 0334 05/25/21 0324  WBC 7.2 9.2 7.9  NEUTROABS  --  7.2  --   HGB 12.9 13.4 13.1  HCT 38.4 38.4 37.8  MCV 93.0 86.9 88.5  PLT 13* 160 379   Basic Metabolic Panel:  Recent Labs  Lab 05/24/21 0154 05/25/21 0324  NA 137 140  K 3.8 3.7  CL 110 107  CO2 21* 29  GLUCOSE 129* 147*  BUN 18 8  CREATININE 0.88 0.76  CALCIUM 7.7* 8.5*   GFR: Estimated Creatinine Clearance: 67 mL/min (by C-G formula based on SCr of 0.76 mg/dL). Liver Function Tests: Recent Labs  Lab 05/24/21 0334  AST 22  ALT 22  ALKPHOS 78  BILITOT 0.5  PROT 6.6  ALBUMIN 3.8   No results for input(s): LIPASE, AMYLASE in the last 168 hours. No results for input(s): AMMONIA in the last 168 hours. Coagulation Profile: Recent Labs  Lab 05/24/21 0334  INR 1.0   Cardiac Enzymes: No results for  input(s): CKTOTAL, CKMB, CKMBINDEX, TROPONINI in the last 168 hours. BNP (last 3 results) No results for input(s): PROBNP in the last 8760 hours. HbA1C: No results for input(s): HGBA1C in the last 72 hours. CBG: No results for input(s): GLUCAP in the last 168 hours. Lipid Profile: No results for input(s): CHOL, HDL, LDLCALC, TRIG, CHOLHDL, LDLDIRECT in the last 72 hours. Thyroid Function Tests: No results for input(s): TSH, T4TOTAL, FREET4, T3FREE, THYROIDAB in the last 72 hours. Anemia Panel: No results for input(s): VITAMINB12, FOLATE, FERRITIN, TIBC, IRON, RETICCTPCT in the last 72 hours. Sepsis Labs: No results for input(s): PROCALCITON, LATICACIDVEN in the last 168 hours.  Recent Results (from the past 240 hour(s))  Resp Panel by RT-PCR (Flu A&B, Covid) Nasopharyngeal Swab     Status: None   Collection Time: 05/24/21  1:54 AM   Specimen: Nasopharyngeal Swab; Nasopharyngeal(NP) swabs in vial transport medium  Result Value Ref Range Status   SARS Coronavirus 2 by RT PCR NEGATIVE NEGATIVE Final    Comment: (NOTE) SARS-CoV-2 target nucleic acids are NOT DETECTED.  The SARS-CoV-2 RNA is generally detectable in upper respiratory specimens during the acute phase of infection. The lowest concentration of SARS-CoV-2 viral copies this assay can detect is 138 copies/mL. A negative result does not preclude SARS-Cov-2 infection and should not be used as the sole basis for treatment or other patient management decisions. A negative result may occur with  improper specimen collection/handling, submission of specimen other than nasopharyngeal swab, presence of viral mutation(s) within the areas targeted by this assay, and inadequate number of viral copies(<138 copies/mL). A negative result must be combined with clinical observations, patient history, and epidemiological information. The expected result is Negative.  Fact Sheet for Patients:   EntrepreneurPulse.com.au  Fact Sheet for Healthcare Providers:  IncredibleEmployment.be  This test is no t yet approved or cleared by the [place] FDA and  has been authorized for detection and/or diagnosis of SARS-CoV-2 by FDA under an Emergency Use Authorization (EUA). This EUA will remain  in effect (meaning this test can be used) for the duration of the COVID-19 declaration under Section 564(b)(1) of the Act, 21 U.S.C.section 360bbb-3(b)(1), unless the authorization is terminated  or revoked sooner.       Influenza A by PCR NEGATIVE NEGATIVE Final   Influenza B by PCR NEGATIVE NEGATIVE Final    Comment: (NOTE) The Xpert Xpress SARS-CoV-2/FLU/RSV plus assay is intended as an aid in the diagnosis of influenza from Nasopharyngeal swab specimens and should not be used as a sole basis for treatment. Nasal washings and aspirates are unacceptable for Xpert Xpress SARS-CoV-2/FLU/RSV testing.  Fact Sheet for Patients: EntrepreneurPulse.com.au  Fact Sheet for Healthcare Providers: IncredibleEmployment.be  This test is not yet approved or cleared by the [place]  FDA and has been authorized for detection and/or diagnosis of SARS-CoV-2 by FDA under an Emergency Use Authorization (EUA). This EUA will remain in effect (meaning this test can be used) for the duration of the COVID-19 declaration under Section 564(b)(1) of the Act, 21 U.S.C. section 360bbb-3(b)(1), unless the authorization is terminated or revoked.  Performed at [facility], [place] [address] [place], [place] [address]          Radiology Studies: DG Tibia/Fibula Left  Result Date: 05/23/2021 CLINICAL DATA:  Fall EXAM: LEFT TIBIA AND FIBULA - 2 VIEW COMPARISON:  Knee radiograph 05/23/2021 FINDINGS: Mid to distal tibia appear intact. Highly comminuted intra-articular fracture involving proximal tibia. Large  lipohemarthrosis. IMPRESSION: Highly comminuted intra-articular fracture involving the proximal tibia with large lipohemarthrosis Electronically Signed   By: Donavan Foil M.D.   On: 05/23/2021 23:43   CT Head Wo Contrast  Result Date: 05/24/2021 CLINICAL DATA:  Fall while dancing, low platelets, EXAM: CT HEAD WITHOUT CONTRAST TECHNIQUE: Contiguous axial images were obtained from the base of the skull through the vertex without intravenous contrast. COMPARISON:  None. FINDINGS: Brain: No evidence of acute infarction, hemorrhage, hydrocephalus, extra-axial collection. Question a rounded density along the left cerebellar pontine angle, could reflect a small meningioma. No other visible or suspected mass. No midline shift. Basal cisterns are patent. Patchy areas of white matter hypoattenuation are most compatible with chronic microvascular angiopathy. Symmetric prominence of the ventricles, cisterns and sulci compatible with parenchymal volume loss. Partially empty sella. Vascular: Atherosclerotic calcification of the carotid siphons. No hyperdense vessel. Skull: No calvarial fracture or suspicious osseous lesion. No scalp swelling or hematoma. Sinuses/Orbits: Paranasal sinuses and mastoid air cells are predominantly clear. Included orbital structures are unremarkable. Other: None IMPRESSION: No significant scalp swelling or calvarial fracture. No acute intracranial hemorrhage or other traumatic findings. Questionable ovoid mass along the left cerebellopontine angle measuring 1.4 x 0.6 x 1 cm in size, possible meningioma. Outpatient MRI with and without contrast could be confirmatory. Partially empty sella. Electronically Signed   By: Lovena Le M.D.   On: 05/24/2021 05:00   CT Knee Left Wo Contrast  Result Date: 05/24/2021 CLINICAL DATA:  Tibial plateau fracture EXAM: CT OF THE LEFT KNEE WITHOUT CONTRAST TECHNIQUE: Multidetector CT imaging of the left knee was performed according to the standard protocol.  Multiplanar CT image reconstructions were also generated. COMPARISON:  Plain films 05/23/2021 FINDINGS: Complex, comminuted fracture noted through the proximal tibia involving the tibial spines chest and extending into both tibial plateaus. There is a large medial fragment which is displaced medially. The femur is also displaced medially relative to the lateral tibial plateau. The fracture extends distally into the tibial shaft. Lipohemarthrosis noted. IMPRESSION: Complex comminuted proximal tibial fracture involving the tibial spines, both tibial plateaus and extending into the tibial shaft. Medial displacement of the medial tibial plateau and femur. Electronically Signed   By: Rolm Baptise M.D.   On: 05/24/2021 02:19   DG Knee Left Port  Result Date: 05/23/2021 CLINICAL DATA:  Fall, deformity, swelling EXAM: PORTABLE LEFT KNEE - 1-2 VIEW COMPARISON:  None. FINDINGS: There is a proximal tibial fracture extending into the knee joint at the tibial spines. This extends into the metaphysis with a linear extension into the proximal diaphysis. Significant displacement of the medial tibial fragment. Joint effusion with lipohemarthrosis. IMPRESSION: Displaced proximal tibial fracture extending from the tibial spines distally into the metadiaphysis. Electronically Signed   By: Rolm Baptise M.D.   On: 05/23/2021  22:31   DG Hip Unilat With Pelvis 2-3 Views Left  Result Date: 05/23/2021 CLINICAL DATA:  Fall with hip pain EXAM: DG HIP (WITH OR WITHOUT PELVIS) 2-3V LEFT COMPARISON:  None. FINDINGS: SI joints are non widened. Pubic symphysis and rami appear intact. No fracture or malalignment. IMPRESSION: No acute osseous abnormality Electronically Signed   By: Donavan Foil M.D.   On: 05/23/2021 23:42        Scheduled Meds:  amLODipine  2.5 mg Oral Daily   cholecalciferol  1,000 Units Oral Daily   pravastatin  20 mg Oral Daily   Continuous Infusions:  sodium chloride 75 mL/hr at 05/25/21 0331     LOS: 1  day    Time spent: 25 mins.More than 50% of that time was spent in counseling and/or coordination of care.      Shelly Coss, MD [facility] P6/16/2022, 7:44 AM

## 2021-05-25 NOTE — H&P (View-Only) (Signed)
Orthopaedic Trauma Service (OTS) Consult   Patient ID: Julie Moran MRN: [id number] DOB/AGE: Jan 19, 1956 65 y.o.  Reason for Consult:Left tibial plateau fracture Referring Physician: Dr. Hector Shade, MD [facility]  HPI: Julie Moran is an 65 y.o. female who is being seen in consultation at the request of Dr. Wynelle Link for evaluation of left tibial plateau fracture.  She had pain following a fall where she was dancing last night she had a partner landed directly on her knee.  She had immediate pain and inability bear weight.  She was brought to [facility] emergency room where a severely displaced bicondylar tibial plateau fracture was identified.  She was admitted to the hospitalist service and [facility] was consulted.  Due to the complexity of her injury it was recommended that she be treated by an orthopedic traumatologist.  Were contacted yesterday evening hence she was transferred over this morning for open reduction internal fixation versus external fixation.  Patient was seen and evaluated in the preoperative holding area.  She is currently comfortable.  No signs of compartment syndrome.  She ambulates without assist device.  She lives in an independent living facility.  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  Her daughter lives in [place].  She does have friends that are local.  She is currently in a knee immobilizer.  She denies any numbness or tingling.  Pain with any attempted range of motion.  Past Medical History:  Diagnosis Date   Allergy    Atrophic vaginitis    Julie-Hogg-Dube syndrome    lung cysts    Julie-Hogg-Dube syndrome    Chest pain    Colon polyps    adenomatous   Constipation    Depression    Diverticulosis    Dry skin    Fatty liver    Floaters    Gall bladder disease    Glaucoma    Heart murmur    Herpes simplex    Hyperlipidemia     Hypertension    Joint pain    Lichen sclerosus    Pancreatitis    Pancreatitis    Pneumothorax, right    spontaneous   Shortness of breath    Shortness of breath on exertion    Swelling of both lower extremities    Trouble in sleeping     Past Surgical History:  Procedure Laterality Date   ABDOMINAL HYSTERECTOMY     BREAST BIOPSY  03/29/2006   BREAST BIOPSY Bilateral 06/14/1998   BREAST EXCISIONAL BIOPSY Bilateral 1999   BREAST LUMPECTOMY  1973,1992,2001   x5 times total , both breasts   CHOLECYSTECTOMY     COLONOSCOPY     2016   epidermoid cyst Right 02/2020   below Right breast   hysterctomy     abdominal   POLYPECTOMY     right ovary removed     UPPER GASTROINTESTINAL ENDOSCOPY      Family History  Problem Relation Age of Onset   Diabetes Mother    Breast cancer Mother    CVA Mother    Hypertension Mother    Heart disease Mother    Thyroid disease Mother    Cancer Mother    Diabetes Father    Prostate cancer Father    Heart disease Father    Hypertension Father    Hyperlipidemia Father    Thyroid disease Father    Cancer Father  Obesity Father    Liver disease Paternal Grandmother    Breast cancer Maternal Aunt        x 3   CVA Paternal Grandfather    Colon cancer Neg Hx    Esophageal cancer Neg Hx    Stomach cancer Neg Hx    Rectal cancer Neg Hx     Social History:  reports that she has never smoked. She has never used smokeless tobacco. She reports that she does not drink alcohol and does not use drugs.  Allergies:  Allergies  Allergen Reactions   No Known Allergies     Medications:  No current facility-administered medications on file prior to encounter.   Current Outpatient Medications on File Prior to Encounter  Medication Sig Dispense Refill   amLODipine (NORVASC) 2.5 MG tablet Take 1 tablet by mouth Daily.      Cholecalciferol (VITAMIN D) 50 MCG (2000 UT) CAPS Take 2,000 Units by mouth daily.     pravastatin (PRAVACHOL) 20 MG  tablet Take 20 mg by mouth daily.      vitamin C (ASCORBIC ACID) 250 MG tablet Take 250 mg by mouth daily. (Patient not taking: Reported on 05/24/2021)     Vitamin D, Ergocalciferol, (DRISDOL) 1.25 MG (50000 UNIT) CAPS capsule Take 1 capsule (50,000 Units total) by mouth every 7 (seven) days. (Patient not taking: Reported on 05/24/2021) 4 capsule 0     ROS: Constitutional: No fever or chills Vision: No changes in vision ENT: No difficulty swallowing CV: No chest pain Pulm: No SOB or wheezing GI: No nausea or vomiting GU: No urgency or inability to hold urine Skin: No poor wound healing Neurologic: No numbness or tingling Psychiatric: No depression or anxiety Heme: No bruising Allergic: No reaction to medications or food   Exam: Blood pressure (!) 152/63, pulse 68, temperature 98.4 F (36.9 C), temperature source Oral, resp. rate 20, height 5\' 3"  (1.6 m), weight 74.8 kg, SpO2 95 %. General: No acute distress Orientation: Awake alert and oriented x3 Mood and Affect: Cooperative and pleasant Gait: Unable to assess due to her fracture Coordination and balance: Within normal limits  Left lower extremity: Knee immobilizer is in place.  It was taken down to evaluate her swelling.  She skin wrinkles both medial and laterally.  It appears that she has plenty of ability to perform surgical incision.  She is swollen but compartments are soft compressible.  She has no pain with passive range of motion of her ankle or toes.  She has no pain with passive stretch.  She has intact motor and sensory function to her foot.  She has a warm well-perfused foot with 2+ DP pulses.   Right lower extremity: Skin without lesions. No tenderness to palpation. Full painless ROM, full strength in each muscle groups without evidence of instability.   Medical Decision Making: Data: Imaging: x-rays and CT scan show a displaced bicondylar tibial plateau fracture with subluxation and dislocation of the femoral  condyles.  Labs:  Results for orders placed or performed during the hospital encounter of 05/23/21 (from the past 24 hour(s))  Basic metabolic panel     Status: Abnormal   Collection Time: 05/25/21  3:24 AM  Result Value Ref Range   Sodium 140 135 - 145 mmol/L   Potassium 3.7 3.5 - 5.1 mmol/L   Chloride 107 98 - 111 mmol/L   CO2 29 22 - 32 mmol/L   Glucose, Bld 147 (H) 70 - 99 mg/dL   BUN 8  8 - 23 mg/dL   Creatinine, Ser 0.76 0.44 - 1.00 mg/dL   Calcium 8.5 (L) 8.9 - 10.3 mg/dL   GFR, Estimated >60 >60 mL/min   Anion gap 4 (L) 5 - 15  CBC     Status: None   Collection Time: 05/25/21  3:24 AM  Result Value Ref Range   WBC 7.9 4.0 - 10.5 K/uL   RBC 4.27 3.87 - 5.11 MIL/uL   Hemoglobin 13.1 12.0 - 15.0 g/dL   HCT 37.8 36.0 - 46.0 %   MCV 88.5 80.0 - 100.0 fL   MCH 30.7 26.0 - 34.0 pg   MCHC 34.7 30.0 - 36.0 g/dL   RDW 12.9 11.5 - 15.5 %   Platelets 151 150 - 400 K/uL   nRBC 0.0 0.0 - 0.2 %     Imaging or Labs ordered: None  Medical history and chart was reviewed and case discussed with medical provider.  Assessment/Plan: 65 year old female with significantly displaced a bicondylar tibial plateau fracture  I discussed risks and benefits with the patient.  I feel that her swelling is appropriate to proceed with open reduction internal fixation.  Risks discussed included but not limited to bleeding, infection, malunion, nonunion, hardware failure, hardware irritation, nerve or blood vessel injury, DVT, posttraumatic arthritis, knee stiffness, need for further surgery including total knee arthroplasty, even the possibility anesthetic complications.  The patient agreed to proceed with surgery.  I discussed the postoperative course with the patient as well as her daughter over the phone.  They are in understanding of the process and wish to proceed with surgery today.  Greater than 50 minutes was spent in face-to-face contact with the patient performing history and physical as well as  discussing risks and benefits, discussing case and coordination of care with Dr. Wynelle Link and Dr. Marcelino Scot, and discussing the patient's care with her daughter and friend over the phone.  Shona Needles, MD [facility] [phone] (office) [email or web address]

## 2021-05-25 NOTE — Anesthesia Procedure Notes (Signed)
Procedure Name: LMA Insertion Date/Time: 05/25/2021 12:34 PM Performed by: Moshe Salisbury, CRNA Pre-anesthesia Checklist: Patient identified, Emergency Drugs available, Suction available and Patient being monitored Patient Re-evaluated:Patient Re-evaluated prior to induction Oxygen Delivery Method: Circle System Utilized Preoxygenation: Pre-oxygenation with 100% oxygen Induction Type: IV induction Ventilation: Mask ventilation without difficulty LMA: LMA inserted LMA Size: 4.0 Number of attempts: 1 Placement Confirmation: positive ETCO2 Tube secured with: Tape Dental Injury: Teeth and Oropharynx as per pre-operative assessment

## 2021-05-25 NOTE — Progress Notes (Signed)
Report given to Angelia with [facility].

## 2021-05-25 NOTE — Anesthesia Postprocedure Evaluation (Signed)
Anesthesia Post Note  Patient: Julie Moran  Procedure(s) Performed: OPEN REDUCTION INTERNAL FIXATION  TIBIAL PLATEAU (Left)     Patient location during evaluation: PACU Anesthesia Type: Regional and General Level of consciousness: awake and alert, oriented and patient cooperative Pain management: pain level controlled Vital Signs Assessment: post-procedure vital signs reviewed and stable Respiratory status: spontaneous breathing, nonlabored ventilation and respiratory function stable Cardiovascular status: blood pressure returned to baseline and stable Postop Assessment: no apparent nausea or vomiting Anesthetic complications: no   No notable events documented.  Last Vitals:  Vitals:   05/25/21 1530 05/25/21 1545  BP: 132/65 138/64  Pulse: (!) 57 (!) 58  Resp: 20 11  Temp:    SpO2: 98% 98%    Last Pain:  Vitals:   05/25/21 1530  TempSrc:   PainSc: 0-No pain                 Pervis Hocking

## 2021-05-25 NOTE — Interval H&P Note (Deleted)
History and Physical Interval Note:  05/25/2021 11:55 AM  Julie Moran  has presented today for surgery, with the diagnosis of Tibial plateau fracture.  The various methods of treatment have been discussed with the patient and family. After consideration of risks, benefits and other options for treatment, the patient has consented to  Procedure(s): OPEN REDUCTION INTERNAL FIXATION  TIBIAL PLATEAU (Left) as a surgical intervention.  The patient's history has been reviewed, patient examined, no change in status, stable for surgery.  I have reviewed the patient's chart and labs.  Questions were answered to the patient's satisfaction.     Lennette Bihari P Sylvester Minton

## 2021-05-25 NOTE — Plan of Care (Signed)

## 2021-05-25 NOTE — Transfer of Care (Signed)
Immediate Anesthesia Transfer of Care Note  Patient: Julie Moran  Procedure(s) Performed: OPEN REDUCTION INTERNAL FIXATION  TIBIAL PLATEAU (Left)  Patient Location: PACU  Anesthesia Type:GA combined with regional for post-op pain  Level of Consciousness: awake and patient cooperative  Airway & Oxygen Therapy: Patient Spontanous Breathing and Patient connected to nasal cannula oxygen  Post-op Assessment: Report given to RN, Post -op Vital signs reviewed and stable and Patient moving all extremities  Post vital signs: Reviewed and stable  Last Vitals:  Vitals Value Taken Time  BP    Temp    Pulse 63 05/25/21 1500  Resp 12 05/25/21 1500  SpO2 100 % 05/25/21 1500  Vitals shown include unvalidated device data.  Last Pain:  Vitals:   05/25/21 1144  TempSrc: Oral  PainSc:       Patients Stated Pain Goal: 2 (40/81/44 8185)  Complications: No notable events documented.

## 2021-05-26 LAB — CBC
HCT: 35 % — ABNORMAL LOW (ref 36.0–46.0)
Hemoglobin: 12.5 g/dL (ref 12.0–15.0)
MCH: 30.9 pg (ref 26.0–34.0)
MCHC: 35.7 g/dL (ref 30.0–36.0)
MCV: 86.4 fL (ref 80.0–100.0)
Platelets: 149 10*3/uL — ABNORMAL LOW (ref 150–400)
RBC: 4.05 MIL/uL (ref 3.87–5.11)
RDW: 12.4 % (ref 11.5–15.5)
WBC: 9.5 10*3/uL (ref 4.0–10.5)
nRBC: 0 % (ref 0.0–0.2)

## 2021-05-26 LAB — BASIC METABOLIC PANEL
Anion gap: 10 (ref 5–15)
BUN: 9 mg/dL (ref 8–23)
CO2: 27 mmol/L (ref 22–32)
Calcium: 8.9 mg/dL (ref 8.9–10.3)
Chloride: 102 mmol/L (ref 98–111)
Creatinine, Ser: 0.78 mg/dL (ref 0.44–1.00)
GFR, Estimated: >60 mL/min (ref >60)
Glucose, Bld: 162 mg/dL — ABNORMAL HIGH (ref 70–99)
Potassium: 3.4 mmol/L — ABNORMAL LOW (ref 3.5–5.1)
Sodium: 139 mmol/L (ref 135–145)

## 2021-05-26 MED ORDER — HYDROCODONE-ACETAMINOPHEN 5-325 MG PO TABS
1.0000 | ORAL_TABLET | Freq: Once | ORAL | Status: AC
  Administered 2021-05-26: 1 via ORAL
  Filled 2021-05-26: qty 1

## 2021-05-26 MED ORDER — HYDROCODONE-ACETAMINOPHEN 5-325 MG PO TABS
1.0000 | ORAL_TABLET | Freq: Four times a day (QID) | ORAL | Status: DC | PRN
  Administered 2021-05-27 – 2021-05-28 (×5): 1 via ORAL
  Filled 2021-05-26 (×6): qty 1

## 2021-05-26 NOTE — Progress Notes (Signed)
PROGRESS NOTE    Julie Moran  CLE:[id number] DOB: 06/05/1956 DOA: 05/23/2021 PCP: Leanna Battles, MD   Chief Complain:Knee pain   Brief Narrative:  Patient is a 65 year old female with history of hypertension .HLD who presented to the emergency department with complaints of acute onset of left knee pain and inability to ambulate after she had an accidental mechanical fall while she was dancing.  Left knee CT showed complex comminuted proximal tibial fracture involving the tibial spines, both tibial plateaus and extending into the tibial shaft.  She has been admitted for fracture management.  Orthopedics consulted and the plan is for ORIF.  She has been transferred to [facility] for the complexity of the fracture  Assessment & Plan:   Active Problems:   Closed bicondylar fracture of left tibial plateau   Closed fracture of left tibial plateau  Closed bicondylar fracture of left tibial plateau:  -Orthopedics following - ORIF of Tibial fracture on L 6/16 --> patient tolerated procedure quite well, pain well controlled, states her nerve block is still intact -Continue PT OT, likely disposition SNF pending clinical course  Hypertension: Continue home medications including amlodipine  Hyperlipidemia: On pravastatin at home.  DVT prophylaxis:SCD Code Status: Full Family Communication: None at bedside Status is: Inpatient  Remains inpatient appropriate because: Need for ongoing evaluation, safe disposition  Dispo: The patient is from: Home              Anticipated d/c is to: SNF              Patient currently is currently medically stable for discharge awaiting PT evaluation, likely will need disposition to SNF, pending bed availability and insurance approval   Difficult to place patient No  Consultants: Orthopedics  Procedures:None  Antimicrobials:  Anti-infectives (From admission, onward)    Start     Dose/Rate Route Frequency Ordered Stop   05/25/21 2100  ceFAZolin  (ANCEF) IVPB 2g/100 mL premix        2 g 200 mL/hr over 30 Minutes Intravenous Every 8 hours 05/25/21 1727 05/26/21 2059   05/25/21 1223  vancomycin (VANCOCIN) powder  Status:  Discontinued          As needed 05/25/21 1224 05/25/21 1456   05/25/21 1130  ceFAZolin (ANCEF) IVPB 2g/100 mL premix        2 g 200 mL/hr over 30 Minutes Intravenous  Once 05/25/21 1124 05/25/21 1223   05/25/21 1125  ceFAZolin (ANCEF) 2-4 GM/100ML-% IVPB       Note to Pharmacy: Ladoris Gene   : cabinet override      05/25/21 1125 05/25/21 1241       Subjective:  No acute issues or events overnight denies nausea vomiting diarrhea constipation headache fevers or chills.  Only complaint today is left lower extremity numbness in the setting of nerve block otherwise somewhat excited for therapy  Objective: Vitals:   05/25/21 1700 05/25/21 1729 05/25/21 2106 05/26/21 0527  BP: (!) 120/54 107/87 130/68 130/65  Pulse: (!) 58 60 74 68  Resp: 16 16 16 16   Temp: (!) 97 F (36.1 C) 97.7 F (36.5 C) 98.2 F (36.8 C) 98.5 F (36.9 C)  TempSrc:  Oral Oral Oral  SpO2: 97% 100% 98% 97%  Weight:      Height:        Intake/Output Summary (Last 24 hours) at 05/26/2021 0753 Last data filed at 05/25/2021 1700 Gross per 24 hour  Intake 1150 ml  Output 250 ml  Net  900 ml    Filed Weights   05/23/21 2206  Weight: 74.8 kg    Examination:  General:  Pleasantly resting in bed, No acute distress. HEENT:  Normocephalic atraumatic.  Sclerae nonicteric, noninjected.  Extraocular movements intact bilaterally. Neck:  Without mass or deformity.  Trachea is midline. Lungs:  Clear to auscultate bilaterally without rhonchi, wheeze, or rales. Heart:  Regular rate and rhythm.  Without murmurs, rubs, or gallops. Abdomen:  Soft, nontender, nondistended.  Without guarding or rebound. Extremities: Without cyanosis, clubbing, edema, left lower extremity binder in place mid thigh to distal calf, diminished sensation of the left  lower extremity distal to the bandage Vascular:  Dorsalis pedis and posterior tibial pulses palpable bilaterally. Skin:  Warm and dry, no erythema, no ulcerations.  Data Reviewed: I have personally reviewed following labs and imaging studies  CBC: Recent Labs  Lab 05/24/21 0154 05/24/21 0334 05/25/21 0324 05/25/21 1803 05/26/21 0222  WBC 7.2 9.2 7.9 9.3 9.5  NEUTROABS  --  7.2  --   --   --   HGB 12.9 13.4 13.1 13.5 12.5  HCT 38.4 38.4 37.8 38.0 35.0*  MCV 93.0 86.9 88.5 88.2 86.4  PLT 13* 160 151 148* 149*    Basic Metabolic Panel: Recent Labs  Lab 05/24/21 0154 05/25/21 0324 05/25/21 1803 05/26/21 0222  NA 137 140  --  139  K 3.8 3.7  --  3.4*  CL 110 107  --  102  CO2 21* 29  --  27  GLUCOSE 129* 147*  --  162*  BUN 18 8  --  9  CREATININE 0.88 0.76 0.80 0.78  CALCIUM 7.7* 8.5*  --  8.9    GFR: Estimated Creatinine Clearance: 67 mL/min (by C-G formula based on SCr of 0.78 mg/dL). Liver Function Tests: Recent Labs  Lab 05/24/21 0334  AST 22  ALT 22  ALKPHOS 78  BILITOT 0.5  PROT 6.6  ALBUMIN 3.8    No results for input(s): LIPASE, AMYLASE in the last 168 hours. No results for input(s): AMMONIA in the last 168 hours. Coagulation Profile: Recent Labs  Lab 05/24/21 0334  INR 1.0    Cardiac Enzymes: No results for input(s): CKTOTAL, CKMB, CKMBINDEX, TROPONINI in the last 168 hours. BNP (last 3 results) No results for input(s): PROBNP in the last 8760 hours. HbA1C: No results for input(s): HGBA1C in the last 72 hours. CBG: No results for input(s): GLUCAP in the last 168 hours. Lipid Profile: No results for input(s): CHOL, HDL, LDLCALC, TRIG, CHOLHDL, LDLDIRECT in the last 72 hours. Thyroid Function Tests: No results for input(s): TSH, T4TOTAL, FREET4, T3FREE, THYROIDAB in the last 72 hours. Anemia Panel: No results for input(s): VITAMINB12, FOLATE, FERRITIN, TIBC, IRON, RETICCTPCT in the last 72 hours. Sepsis Labs: No results for input(s):  PROCALCITON, LATICACIDVEN in the last 168 hours.  Recent Results (from the past 240 hour(s))  Resp Panel by RT-PCR (Flu A&B, Covid) Nasopharyngeal Swab     Status: None   Collection Time: 05/24/21  1:54 AM   Specimen: Nasopharyngeal Swab; Nasopharyngeal(NP) swabs in vial transport medium  Result Value Ref Range Status   SARS Coronavirus 2 by RT PCR NEGATIVE NEGATIVE Final    Comment: (NOTE) SARS-CoV-2 target nucleic acids are NOT DETECTED.  The SARS-CoV-2 RNA is generally detectable in upper respiratory specimens during the acute phase of infection. The lowest concentration of SARS-CoV-2 viral copies this assay can detect is 138 copies/mL. A negative result does not preclude SARS-Cov-2 infection  and should not be used as the sole basis for treatment or other patient management decisions. A negative result may occur with  improper specimen collection/handling, submission of specimen other than nasopharyngeal swab, presence of viral mutation(s) within the areas targeted by this assay, and inadequate number of viral copies(<138 copies/mL). A negative result must be combined with clinical observations, patient history, and epidemiological information. The expected result is Negative.  Fact Sheet for Patients:  EntrepreneurPulse.com.au  Fact Sheet for Healthcare Providers:  IncredibleEmployment.be  This test is no t yet approved or cleared by the [place] FDA and  has been authorized for detection and/or diagnosis of SARS-CoV-2 by FDA under an Emergency Use Authorization (EUA). This EUA will remain  in effect (meaning this test can be used) for the duration of the COVID-19 declaration under Section 564(b)(1) of the Act, 21 U.S.C.section 360bbb-3(b)(1), unless the authorization is terminated  or revoked sooner.       Influenza A by PCR NEGATIVE NEGATIVE Final   Influenza B by PCR NEGATIVE NEGATIVE Final    Comment: (NOTE) The Xpert Xpress  SARS-CoV-2/FLU/RSV plus assay is intended as an aid in the diagnosis of influenza from Nasopharyngeal swab specimens and should not be used as a sole basis for treatment. Nasal washings and aspirates are unacceptable for Xpert Xpress SARS-CoV-2/FLU/RSV testing.  Fact Sheet for Patients: EntrepreneurPulse.com.au  Fact Sheet for Healthcare Providers: IncredibleEmployment.be  This test is not yet approved or cleared by the [place] FDA and has been authorized for detection and/or diagnosis of SARS-CoV-2 by FDA under an Emergency Use Authorization (EUA). This EUA will remain in effect (meaning this test can be used) for the duration of the COVID-19 declaration under Section 564(b)(1) of the Act, 21 U.S.C. section 360bbb-3(b)(1), unless the authorization is terminated or revoked.  Performed at [facility], [place] [address] [place], [place] [address]           Radiology Studies: DG Knee Complete 4 Views Left  Result Date: 05/25/2021 CLINICAL DATA:  ORIF left tibial plateau. EXAM: DG C-ARM 1-60 MIN; LEFT KNEE - COMPLETE 4+ VIEW FLUOROSCOPY TIME:  Fluoroscopy Time:  2 minutes and 15 seconds. Radiation Exposure Index (if provided by the fluoroscopic device): 4.32 mGy. Number of Acquired Spot Images: 10 COMPARISON:  May 23, 2021. FINDINGS: Thin C-arm fluoroscopic images were obtained intraoperatively and submitted for post operative interpretation. These images demonstrate plate and screw fixation a comminuted proximal tibial fracture. Improved, near anatomic alignment. No unexpected findings. Expected overlying soft tissue swelling and gas. Please see the performing provider's procedural report for further detail. IMPRESSION: Intraoperative fluoroscopy, as detailed above. Electronically Signed   By: Margaretha Sheffield MD   On: 05/25/2021 15:38   DG Knee Left Port  Result Date: 05/25/2021 CLINICAL DATA:  Postop EXAM: PORTABLE LEFT  KNEE - 1-2 VIEW COMPARISON:  05/25/2021, CT 05/24/2021, 05/23/2021 FINDINGS: Interval surgical plate and multiple screw fixation of comminuted intra-articular tibial fracture with anatomic alignment. Three additional linear metallic fragments at the articular surface of the lateral tibial plateau. Air in the soft tissues consistent with recent surgery. IMPRESSION: Interval surgical fixation of comminuted tibial plateau fractures. Electronically Signed   By: Donavan Foil M.D.   On: 05/25/2021 17:31   DG C-Arm 1-60 Min  Result Date: 05/25/2021 CLINICAL DATA:  ORIF left tibial plateau. EXAM: DG C-ARM 1-60 MIN; LEFT KNEE - COMPLETE 4+ VIEW FLUOROSCOPY TIME:  Fluoroscopy Time:  2 minutes and 15 seconds. Radiation Exposure Index (if provided by  the fluoroscopic device): 4.32 mGy. Number of Acquired Spot Images: 10 COMPARISON:  May 23, 2021. FINDINGS: Thin C-arm fluoroscopic images were obtained intraoperatively and submitted for post operative interpretation. These images demonstrate plate and screw fixation a comminuted proximal tibial fracture. Improved, near anatomic alignment. No unexpected findings. Expected overlying soft tissue swelling and gas. Please see the performing provider's procedural report for further detail. IMPRESSION: Intraoperative fluoroscopy, as detailed above. Electronically Signed   By: Margaretha Sheffield MD   On: 05/25/2021 15:38        Scheduled Meds:  amLODipine  2.5 mg Oral Daily   cholecalciferol  1,000 Units Oral Daily   docusate sodium  100 mg Oral BID   enoxaparin (LOVENOX) injection  40 mg Subcutaneous Q24H   pravastatin  20 mg Oral Daily   Continuous Infusions:  sodium chloride 75 mL/hr at 05/25/21 1743    ceFAZolin (ANCEF) IV 2 g (05/26/21 0525)   methocarbamol (ROBAXIN) IV       LOS: 2 days    Time spent: 25 min   Little Ishikawa, DO [facility] P6/17/2022, 7:53 AM

## 2021-05-26 NOTE — Evaluation (Signed)
Physical Therapy Evaluation Patient Details Name: Julie Moran MRN: [id number] DOB: [birth year] Today's Date: 05/26/2021   History of Present Illness  65 yo female s/p ORIF tibial plateau fx, L tibial tuberosities on 6/16 after fall while dancing. PMH includes Birt Lynelle Smoke syndrome, depression, HTN, HLD, pancreatitis, lap chole.  Clinical Impression  Pt presents with generalized weakness, impaired LLE ROM and strength s/p surgery and given still-active block, impaired balance, decreased knowledge/application of precautions, and decreased activity tolerance. Pt to benefit from acute PT to address deficits. Pt overall requiring min assist for bed mobility, transfer to/from [facility]. At baseline pt lives alone at [place] and is independent, recommending ST-SNF to address deficits. PT to progress mobility as tolerated, and will continue to follow acutely.      Follow Up Recommendations SNF (at [facility])    Equipment Recommendations  Other (comment) (TBD, likely RW and BSC)    Recommendations for Other Services       Precautions / Restrictions Precautions Precautions: Fall Required Braces or Orthoses: Knee Immobilizer - Left Knee Immobilizer - Left: Other (comment);On when out of bed or walking (at night and when OOB, active and passive ROM in supine Okay per pt and op note) Restrictions Weight Bearing Restrictions: Yes LLE Weight Bearing: Non weight bearing      Mobility  Bed Mobility Overal bed mobility: Needs Assistance Bed Mobility: Supine to Sit;Sit to Supine     Supine to sit: Min assist Sit to supine: Min assist   General bed mobility comments: min assist for LLE management, scooting to/from EOB.    Transfers Overall transfer level: Needs assistance Equipment used: Rolling walker (2 wheeled) Transfers: Sit to/from [facility] Sit to Stand: Min assist Stand pivot transfers: Min assist       General transfer comment: min assist for rise, steady,  and pivot to/from [facility] safely. Verbal cuing for hand placement when rising/sitting, good maintenance of NWB precautions  Ambulation/Gait             General Gait Details: nt  Stairs            Wheelchair Mobility    Modified Rankin (Stroke Patients Only)       Balance Overall balance assessment: Needs assistance Sitting-balance support: No upper extremity supported;Feet supported Sitting balance-Leahy Scale: Fair     Standing balance support: Bilateral upper extremity supported;During functional activity Standing balance-Leahy Scale: Poor Standing balance comment: reliant on external assist                             Pertinent Vitals/Pain Pain Assessment: Faces Faces Pain Scale: No hurt Pain Location: LLE (numb from block) Pain Intervention(s): Limited activity within patient's tolerance;Monitored during session;Repositioned    Home Living Family/patient expects to be discharged to:: Skilled nursing facility                      Prior Function Level of Independence: Independent         Comments: pt lives at [place], will transition to ST-rehab at d/c     Hand Dominance   Dominant Hand: Right    Extremity/Trunk Assessment   Upper Extremity Assessment Upper Extremity Assessment: Defer to OT evaluation    Lower Extremity Assessment Lower Extremity Assessment: Generalized weakness;LLE deficits/detail LLE Deficits / Details: numb from block, no active LLE movement LLE: Unable to fully assess due to immobilization (block)    Cervical / Trunk  Assessment Cervical / Trunk Assessment: Normal  Communication   Communication: No difficulties  Cognition Arousal/Alertness: Awake/alert Behavior During Therapy: Anxious Overall Cognitive Status: Within Functional Limits for tasks assessed                                 General Comments: anxious at times, tearful intermittently during session and pt states "this is  my weepy day"      General Comments      Exercises General Exercises - Lower Extremity Ankle Circles/Pumps: PROM;Left;10 reps;Supine Heel Slides: PROM;Left;5 reps;Supine (very limited ROM, ~0-20 degrees, given block's present effects \)   Assessment/Plan    PT Assessment Patient needs continued PT services  PT Problem List Decreased strength;Decreased mobility;Decreased safety awareness;Decreased knowledge of precautions;Decreased activity tolerance;Decreased knowledge of use of DME;Decreased balance;Decreased range of motion       PT Treatment Interventions DME instruction;Therapeutic activities;Gait training;Therapeutic exercise;Patient/family education;Balance training;Functional mobility training;Neuromuscular re-education    PT Goals (Current goals can be found in the Care Plan section)  Acute Rehab PT Goals Patient Stated Goal: go to [facility] PT Goal Formulation: With patient Time For Goal Achievement: 06/09/21 Potential to Achieve Goals: Good    Frequency Min 3X/week   Barriers to discharge        Co-evaluation               AM-PAC PT "6 Clicks" Mobility  Outcome Measure Help needed turning from your back to your side while in a flat bed without using bedrails?: A Little Help needed moving from lying on your back to sitting on the side of a flat bed without using bedrails?: A Little Help needed moving to and from a bed to a chair (including a wheelchair)?: A Little Help needed standing up from a chair using your arms (e.g., wheelchair or bedside chair)?: A Little Help needed to walk in hospital room?: A Lot Help needed climbing 3-5 steps with a railing? : A Lot 6 Click Score: 16    End of Session Equipment Utilized During Treatment: Left knee immobilizer Activity Tolerance: Patient tolerated treatment well;Patient limited by fatigue Patient left: in bed;with call bell/phone within reach;with bed alarm set Nurse Communication: Mobility status PT  Visit Diagnosis: Other abnormalities of gait and mobility (R26.89);Difficulty in walking, not elsewhere classified (R26.2)    Time: 1050-1110 PT Time Calculation (min) (ACUTE ONLY): 20 min   Charges:   PT Evaluation $PT Eval Low Complexity: 1 Low          Elaysia Devargas S, PT DPT Acute Rehabilitation Services Pager [phone]  Office [phone]   [place] E Ruffin Pyo 05/26/2021, 12:00 PM

## 2021-05-26 NOTE — TOC CAGE-AID Note (Signed)
Transition of Care [facility]) - CAGE-AID Screening   Patient Details  Name: Julie Moran MRN: [id number] Date of Birth: 31-May-1956   Elvina Sidle, RN Trauma Response Nurse Phone Number:  [phone] 05/26/2021, 11:11 AM    CAGE-AID Screening:    Have You Ever Felt You Ought to Cut Down on Your Drinking or Drug Use?: No Have People Annoyed You By [employer] Your Drinking Or Drug Use?: No Have You Felt Bad Or Guilty About Your Drinking Or Drug Use?: No Have You Ever Had a Drink or Used Drugs First Thing In The Morning to Steady Your Nerves or to Get Rid of a Hangover?: No CAGE-AID Score: 0  Substance Abuse Education Offered: No (denies drug/alcohol use)

## 2021-05-26 NOTE — Evaluation (Signed)
Occupational Therapy Evaluation Patient Details Name: Julie Moran MRN: [id number] DOB: [birth year] Today's Date: 05/26/2021    History of Present Illness 65 yo female s/p ORIF tibial plateau fx, L tibial tuberosities on 6/16 after fall while dancing. PMH includes Birt Lynelle Smoke syndrome, depression, HTN, HLD, pancreatitis, lap chole.   Clinical Impression   Pt presents with decline in function and safety with ADLs and ADL mobility with impaired strength, balance and endurance. PTA pt lived at [place], was Ind with ADLs/selfcare, IADLs, home mgt, mobility and was driving. Pt currently requires min A for mobility with RW, max - total A with LB selfcare and toileting. Pt would benefit from acute OT services to address impairments to maximize level of function and safety    Follow Up Recommendations  SNF    Equipment Recommendations  3 in 1 bedside commode;Other (comment);Tub/shower seat (RW, reacher)    Recommendations for Other Services       Precautions / Restrictions Precautions Precautions: Fall Required Braces or Orthoses: Knee Immobilizer - Left Knee Immobilizer - Left: Other (comment);On when out of bed or walking Restrictions Weight Bearing Restrictions: Yes LLE Weight Bearing: Non weight bearing      Mobility Bed Mobility Overal bed mobility: Needs Assistance Bed Mobility: Supine to Sit;Sit to Supine     Supine to sit: Min assist Sit to supine: Min assist   General bed mobility comments: min assist for LLE management, scooting to/from EOB.    Transfers Overall transfer level: Needs assistance Equipment used: Rolling walker (2 wheeled) Transfers: Sit to/from [facility] Sit to Stand: Min assist Stand pivot transfers: Min assist       General transfer comment: min assist for rise, steady, and pivot to/from [facility] safely. Verbal cuing for hand placement when rising/sitting, good maintenance of NWB precautions    Balance Overall  balance assessment: Needs assistance Sitting-balance support: No upper extremity supported;Feet supported Sitting balance-Leahy Scale: Fair     Standing balance support: Bilateral upper extremity supported;During functional activity Standing balance-Leahy Scale: Poor Standing balance comment: reliant on external assist                           ADL either performed or assessed with clinical judgement   ADL Overall ADL's : Needs assistance/impaired Eating/Feeding: Set up;Independent;Sitting   Grooming: Wash/dry hands;Wash/dry face;Supervision/safety;Set up;Sitting   Upper Body Bathing: Min guard;Sitting   Lower Body Bathing: Moderate assistance;Sitting/lateral leans   Upper Body Dressing : Min guard;Sitting   Lower Body Dressing: Total assistance   Toilet Transfer: Minimal assistance;Stand-pivot;BSC   Toileting- Clothing Manipulation and Hygiene: Maximal assistance       Functional mobility during ADLs: Minimal assistance;Cueing for safety;Rolling walker       Vision Baseline Vision/History: Wears glasses Patient Visual Report: No change from baseline       Perception     Praxis      Pertinent Vitals/Pain Pain Assessment: No/denies pain Pain Score: 0-No pain Faces Pain Scale: No hurt Pain Location: LLE (numb from block) Pain Intervention(s): Monitored during session;Repositioned     Hand Dominance Right   Extremity/Trunk Assessment Upper Extremity Assessment Upper Extremity Assessment: Overall WFL for tasks assessed   Lower Extremity Assessment Lower Extremity Assessment: Defer to PT evaluation LLE Deficits / Details: numb from block, no active LLE movement LLE: Unable to fully assess due to immobilization (block)   Cervical / Trunk Assessment Cervical / Trunk Assessment: Normal   Communication Communication Communication:  No difficulties   Cognition Arousal/Alertness: Awake/alert Behavior During Therapy: Anxious Overall Cognitive  Status: Within Functional Limits for tasks assessed                                 General Comments: anxious at times, tearful intermittently during session and pt states "this is my weepy day"   General Comments       Exercises General Exercises - Lower Extremity Ankle Circles/Pumps: PROM;Left;10 reps;Supine Heel Slides: PROM;Left;5 reps;Supine (very limited ROM, ~0-20 degrees, given block's present effects \)   Shoulder Instructions      Home Living Family/patient expects to be discharged to:: Skilled nursing facility                                        Prior Functioning/Environment Level of Independence: Independent        Comments: pt lives at [place], will transition to ST-rehab at d/c        OT Problem List: Decreased strength;Impaired balance (sitting and/or standing);Decreased coordination;Decreased knowledge of use of DME or AE      OT Treatment/Interventions: Self-care/ADL training;DME and/or AE instruction;Therapeutic activities;Balance training;Therapeutic exercise;Patient/family education    OT Goals(Current goals can be found in the care plan section) Acute Rehab OT Goals Patient Stated Goal: go to [facility] OT Goal Formulation: With patient Time For Goal Achievement: 06/09/21 Potential to Achieve Goals: Good ADL Goals Pt Will Perform Grooming: with min guard assist;standing Pt Will Perform Upper Body Bathing: with supervision;with set-up;sitting Pt Will Perform Lower Body Bathing: with mod assist;with min assist;sitting/lateral leans Pt Will Perform Upper Body Dressing: with supervision;with set-up;sitting Pt Will Transfer to Toilet: with min assist;with min guard assist;ambulating;regular height toilet;bedside commode Pt Will Perform Toileting - Clothing Manipulation and hygiene: with mod assist;with min assist;sit to/from stand  OT Frequency: Min 2X/week   Barriers to D/C:            Co-evaluation               AM-PAC OT "6 Clicks" Daily Activity     Outcome Measure Help from another person eating meals?: None Help from another person taking care of personal grooming?: A Little Help from another person toileting, which includes using toliet, bedpan, or urinal?: Total Help from another person bathing (including washing, rinsing, drying)?: A Lot Help from another person to put on and taking off regular upper body clothing?: A Little Help from another person to put on and taking off regular lower body clothing?: Total 6 Click Score: 14   End of Session Equipment Utilized During Treatment: Gait belt;Rolling walker;Other (comment) (BSC)  Activity Tolerance: Patient tolerated treatment well Patient left: in bed;with call bell/phone within reach;with bed alarm set  OT Visit Diagnosis: Unsteadiness on feet (R26.81);Other abnormalities of gait and mobility (R26.89);History of falling (Z91.81);Muscle weakness (generalized) (M62.81);Pain Pain - Right/Left: Left Pain - part of body: Hip;Leg                Time: 1025-8527 OT Time Calculation (min): 20 min Charges:  OT General Charges $OT Visit: 1 Visit OT Evaluation $OT Eval Moderate Complexity: 1 Mod    Britt Bottom 05/26/2021, 2:57 PM

## 2021-05-26 NOTE — Progress Notes (Signed)
Ortho Trauma Note  Block still in place. No major issues overnight.   LLE: knee immobilizer in place. Dressing clean and dry. Compartments soft and compressible. No active movement of foot and ankle. 2+ dp pulse  Imaging: stable postop imaging  A/P s/p ORIF bicondylar tibial plateau fracture  NWB left leg Okay for active and passive ROM Only knee immobilizer when up with PT okay to take off while in b ed or sitting in chair Pain control PT/OT Lovenox for vte prophylaxis  Likely will rehab portion of independent living  Shona Needles, MD

## 2021-05-27 LAB — BASIC METABOLIC PANEL
Anion gap: 6 (ref 5–15)
BUN: 7 mg/dL — ABNORMAL LOW (ref 8–23)
CO2: 30 mmol/L (ref 22–32)
Calcium: 8.3 mg/dL — ABNORMAL LOW (ref 8.9–10.3)
Chloride: 102 mmol/L (ref 98–111)
Creatinine, Ser: 0.76 mg/dL (ref 0.44–1.00)
GFR, Estimated: >60 mL/min (ref >60)
Glucose, Bld: 107 mg/dL — ABNORMAL HIGH (ref 70–99)
Potassium: 3.2 mmol/L — ABNORMAL LOW (ref 3.5–5.1)
Sodium: 138 mmol/L (ref 135–145)

## 2021-05-27 LAB — CBC
HCT: 32.6 % — ABNORMAL LOW (ref 36.0–46.0)
Hemoglobin: 11.4 g/dL — ABNORMAL LOW (ref 12.0–15.0)
MCH: 30.9 pg (ref 26.0–34.0)
MCHC: 35 g/dL (ref 30.0–36.0)
MCV: 88.3 fL (ref 80.0–100.0)
Platelets: 147 10*3/uL — ABNORMAL LOW (ref 150–400)
RBC: 3.69 MIL/uL — ABNORMAL LOW (ref 3.87–5.11)
RDW: 12.8 % (ref 11.5–15.5)
WBC: 8.2 10*3/uL (ref 4.0–10.5)
nRBC: 0 % (ref 0.0–0.2)

## 2021-05-27 MED ORDER — HYDROCODONE-ACETAMINOPHEN 5-325 MG PO TABS: 1.0000 | ORAL_TABLET | Freq: Four times a day (QID) | ORAL | 0 refills | Status: DC | PRN

## 2021-05-27 MED ORDER — ENOXAPARIN SODIUM 40 MG/0.4ML IJ SOSY: 40.0000 mg | PREFILLED_SYRINGE | INTRAMUSCULAR | 0 refills | Status: DC

## 2021-05-27 NOTE — Progress Notes (Addendum)
Subjective: 2 Days Post-Op s/p Procedure(s): OPEN REDUCTION INTERNAL FIXATION  TIBIAL PLATEAU   Patient is alert, oriented, yes  Patient reports pain as moderate.   Denies chest pain, SOB, Calf pain. No nausea/vomiting. No other complaints.   Objective:  PE: VITALS:   Vitals:   05/26/21 0814 05/26/21 1245 05/26/21 2100 05/27/21 0533  BP: (!) 156/69 (!) 178/81 (!) 155/73 (!) 174/69  Pulse: 63 71 64 66  Resp: 18 18 16 16   Temp: 99 F (37.2 C) 98.7 F (37.1 C) 99.1 F (37.3 C) 98.2 F (36.8 C)  TempSrc: Oral Oral Oral Oral  SpO2: 97% 100% 98% 99%  Weight:      Height:       LLE - dressing changes, mild drainage seen on mepilexes. Dorsiflexion and plantarflexion intact. Able to flex and extend all toes. Sensation intact to all aspects of left foot.  Foot warm and well perfused. Compartments compressible.    LABS  Results for orders placed or performed during the hospital encounter of 05/23/21 (from the past 24 hour(s))  Basic metabolic panel     Status: Abnormal   Collection Time: 05/27/21  7:39 AM  Result Value Ref Range   Sodium 138 135 - 145 mmol/L   Potassium 3.2 (L) 3.5 - 5.1 mmol/L   Chloride 102 98 - 111 mmol/L   CO2 30 22 - 32 mmol/L   Glucose, Bld 107 (H) 70 - 99 mg/dL   BUN 7 (L) 8 - 23 mg/dL   Creatinine, Ser 0.76 0.44 - 1.00 mg/dL   Calcium 8.3 (L) 8.9 - 10.3 mg/dL   GFR, Estimated >60 >60 mL/min   Anion gap 6 5 - 15  CBC     Status: Abnormal   Collection Time: 05/27/21  7:39 AM  Result Value Ref Range   WBC 8.2 4.0 - 10.5 K/uL   RBC 3.69 (L) 3.87 - 5.11 MIL/uL   Hemoglobin 11.4 (L) 12.0 - 15.0 g/dL   HCT 32.6 (L) 36.0 - 46.0 %   MCV 88.3 80.0 - 100.0 fL   MCH 30.9 26.0 - 34.0 pg   MCHC 35.0 30.0 - 36.0 g/dL   RDW 12.8 11.5 - 15.5 %   Platelets 147 (L) 150 - 400 K/uL   nRBC 0.0 0.0 - 0.2 %    DG Knee Complete 4 Views Left  Result Date: 05/25/2021 CLINICAL DATA:  ORIF left tibial plateau. EXAM: DG C-ARM 1-60 MIN; LEFT KNEE - COMPLETE 4+  VIEW FLUOROSCOPY TIME:  Fluoroscopy Time:  2 minutes and 15 seconds. Radiation Exposure Index (if provided by the fluoroscopic device): 4.32 mGy. Number of Acquired Spot Images: 10 COMPARISON:  May 23, 2021. FINDINGS: Thin C-arm fluoroscopic images were obtained intraoperatively and submitted for post operative interpretation. These images demonstrate plate and screw fixation a comminuted proximal tibial fracture. Improved, near anatomic alignment. No unexpected findings. Expected overlying soft tissue swelling and gas. Please see the performing provider's procedural report for further detail. IMPRESSION: Intraoperative fluoroscopy, as detailed above. Electronically Signed   By: Margaretha Sheffield MD   On: 05/25/2021 15:38   DG Knee Left Port  Result Date: 05/25/2021 CLINICAL DATA:  Postop EXAM: PORTABLE LEFT KNEE - 1-2 VIEW COMPARISON:  05/25/2021, CT 05/24/2021, 05/23/2021 FINDINGS: Interval surgical plate and multiple screw fixation of comminuted intra-articular tibial fracture with anatomic alignment. Three additional linear metallic fragments at the articular surface of the lateral tibial plateau. Air in the soft tissues consistent with recent surgery. IMPRESSION:  Interval surgical fixation of comminuted tibial plateau fractures. Electronically Signed   By: Donavan Foil M.D.   On: 05/25/2021 17:31   DG C-Arm 1-60 Min  Result Date: 05/25/2021 CLINICAL DATA:  ORIF left tibial plateau. EXAM: DG C-ARM 1-60 MIN; LEFT KNEE - COMPLETE 4+ VIEW FLUOROSCOPY TIME:  Fluoroscopy Time:  2 minutes and 15 seconds. Radiation Exposure Index (if provided by the fluoroscopic device): 4.32 mGy. Number of Acquired Spot Images: 10 COMPARISON:  May 23, 2021. FINDINGS: Thin C-arm fluoroscopic images were obtained intraoperatively and submitted for post operative interpretation. These images demonstrate plate and screw fixation a comminuted proximal tibial fracture. Improved, near anatomic alignment. No unexpected findings.  Expected overlying soft tissue swelling and gas. Please see the performing provider's procedural report for further detail. IMPRESSION: Intraoperative fluoroscopy, as detailed above. Electronically Signed   By: Margaretha Sheffield MD   On: 05/25/2021 15:38    Assessment/Plan: Closed bicondylar fracture of left tibial plateau 2 Days Post-Op s/p Procedure(s): OPEN REDUCTION INTERNAL FIXATION  TIBIAL PLATEAU  Weightbearing: NWB LLE, ok for active and passive ROM  Insicional and dressing care: dressing changed today, reinforce as needed Orthopedic device(s): knee immobilizer - only needs knee immobilizer when up with PT okay to take off while in bed or sitting in chair VTE prophylaxis: lovenox 40mg  q 24 hours Pain control: continue current regimen  Dispo: PT and OT recommending SNF, TOC consult placed. States that they are available to take patient when medically ok to discharge. OK to discharge home today from ortho standpoint if able to have transport this afternoon, otherwise ok to discharge home tomorrow.   Contact information:   Weekdays 8-5 Merlene Pulling,  After hours and holidays please check [email or web address] for group call information for [facility]  Ventura Bruns 05/27/2021, 10:17 AM

## 2021-05-27 NOTE — TOC Progression Note (Signed)
Transition of Care [facility]) - Progression Note    Patient Details  Name: Julie Moran MRN: [id number] Date of Birth: 09/26/1956  Transition of Care [facility]) CM/SW [place], [place] Phone Number: 05/27/2021, 2:09 PM  Clinical Narrative:     CSW spoke with pt's SNF ([facility]).  Pt can discharge back to facility when medically ready.  Contact [facility] at [phone].  TOC will continue to assist with disposition planning.    Barriers to Discharge: Continued Medical Work up  Expected Discharge Plan and Services                                                 Social Determinants of Health (SDOH) Interventions    Readmission Risk Interventions No flowsheet data found.

## 2021-05-27 NOTE — Plan of Care (Signed)
  Problem: Health Behavior/Discharge Planning: Goal: Ability to manage health-related needs will improve Outcome: Progressing   Problem: Clinical Measurements: Goal: Will remain free from infection Outcome: Progressing   Problem: Clinical Measurements: Goal: Respiratory complications will improve Outcome: Progressing   Problem: Activity: Goal: Risk for activity intolerance will decrease Outcome: Progressing   Problem: Pain Managment: Goal: General experience of comfort will improve Outcome: Progressing   Problem: Safety: Goal: Ability to remain free from injury will improve Outcome: Progressing   Problem: Skin Integrity: Goal: Risk for impaired skin integrity will decrease Outcome: Progressing

## 2021-05-27 NOTE — Discharge Instructions (Addendum)
Orthopaedic Trauma Service Discharge Instructions   General Discharge Instructions  WEIGHT BEARING STATUS: Non-weightbearing left lower extremity  RANGE OF MOTION/ACTIVITY: Wear knee immobilizer when up out of bed. Okay to take off while in bed or sitting in chair  Wound Care: You may remove your surgical dressing on Monday 05/29/21. Incisions can be left open to air if there is no drainage. If incision continues to have drainage, follow wound care instructions below. Okay to shower if no drainage from incisions.  DVT/PE prophylaxis: Lovenox x 30 days  Diet: as you were eating previously.  Can use over the counter stool softeners and bowel preparations, such as Miralax, to help with bowel movements.  Narcotics can be constipating.  Be sure to drink plenty of fluids  PAIN MEDICATION USE AND EXPECTATIONS  You have likely been given narcotic medications to help control your pain.  After a traumatic event that results in an fracture (broken bone) with or without surgery, it is ok to use narcotic pain medications to help control one's pain.  We understand that everyone responds to pain differently and each individual patient will be evaluated on a regular basis for the continued need for narcotic medications. Ideally, narcotic medication use should last no more than 6-8 weeks (coinciding with fracture healing).   As a patient it is your responsibility as well to monitor narcotic medication use and report the amount and frequency you use these medications when you come to your office visit.   We would also advise that if you are using narcotic medications, you should take a dose prior to therapy to maximize you participation.  IF YOU ARE ON NARCOTIC MEDICATIONS IT IS NOT PERMISSIBLE TO OPERATE A MOTOR VEHICLE (MOTORCYCLE/CAR/TRUCK/MOPED) OR HEAVY MACHINERY DO NOT MIX NARCOTICS WITH OTHER CNS (CENTRAL NERVOUS SYSTEM) DEPRESSANTS SUCH AS ALCOHOL   STOP SMOKING OR USING NICOTINE PRODUCTS!!!!  As  discussed nicotine severely impairs your body's ability to heal surgical and traumatic wounds but also impairs bone healing.  Wounds and bone heal by forming microscopic blood vessels (angiogenesis) and nicotine is a vasoconstrictor (essentially, shrinks blood vessels).  Therefore, if vasoconstriction occurs to these microscopic blood vessels they essentially disappear and are unable to deliver necessary nutrients to the healing tissue.  This is one modifiable factor that you can do to dramatically increase your chances of healing your injury.    (This means no smoking, no nicotine gum, patches, etc)  DO NOT USE NONSTEROIDAL ANTI-INFLAMMATORY DRUGS (NSAID'S)  Using products such as Advil (ibuprofen), Aleve (naproxen), Motrin (ibuprofen) for additional pain control during fracture healing can delay and/or prevent the healing response.  If you would like to take over the counter (OTC) medication, Tylenol (acetaminophen) is ok.  However, some narcotic medications that are given for pain control contain acetaminophen as well. Therefore, you should not exceed more than 4000 mg of tylenol in a day if you do not have liver disease.  Also note that there are may OTC medicines, such as cold medicines and allergy medicines that my contain tylenol as well.  If you have any questions about medications and/or interactions please ask your doctor/PA or your pharmacist.      ICE AND ELEVATE INJURED/OPERATIVE EXTREMITY  Using ice and elevating the injured extremity above your heart can help with swelling and pain control.  Icing in a pulsatile fashion, such as 20 minutes on and 20 minutes off, can be followed.    Do not place ice directly on skin. Make sure there  is a barrier between to skin and the ice pack.    Using frozen items such as frozen peas works well as the conform nicely to the are that needs to be iced.  USE AN ACE WRAP OR TED HOSE FOR SWELLING CONTROL  In addition to icing and elevation, Ace wraps or TED  hose are used to help limit and resolve swelling.  It is recommended to use Ace wraps or TED hose until you are informed to stop.    When using Ace Wraps start the wrapping distally (farthest away from the body) and wrap proximally (closer to the body)   Example: If you had surgery on your leg or thing and you do not have a splint on, start the ace wrap at the toes and work your way up to the thigh        If you had surgery on your upper extremity and do not have a splint on, start the ace wrap at your fingers and work your way up to the upper arm  [place]: [phone]   VISIT OUR WEBSITE FOR ADDITIONAL INFORMATION: [email or web address]      Discharge Wound Care Instructions  Do NOT apply any ointments, solutions or lotions to pin sites or surgical wounds.  These prevent needed drainage and even though solutions like hydrogen peroxide kill bacteria, they also damage cells lining the pin sites that help fight infection.  Applying lotions or ointments can keep the wounds moist and can cause them to breakdown and open up as well. This can increase the risk for infection. When in doubt call the office.  If any drainage is noted, use one layer of adaptic, then gauze, Kerlix, and an ace wrap.  Once the incision is completely dry and without drainage, it may be left open to air out.  Showering may begin 36-48 hours later.  Cleaning gently with soap and water.

## 2021-05-27 NOTE — Progress Notes (Signed)
PROGRESS NOTE    Julie Moran  CBJ:[id number] DOB: Mar 30, 1956 DOA: 05/23/2021 PCP: Leanna Battles, MD   Chief Complain:Knee pain   Brief Narrative:  Patient is a 65 year old female with history of hypertension .HLD who presented to the emergency department with complaints of acute onset of left knee pain and inability to ambulate after she had an accidental mechanical fall while she was dancing.  Left knee CT showed complex comminuted proximal tibial fracture involving the tibial spines, both tibial plateaus and extending into the tibial shaft.  She has been admitted for fracture management.  Orthopedics consulted and the plan is for ORIF.  She has been transferred to [facility] for the complexity of the fracture  Assessment & Plan:   Active Problems:   Closed bicondylar fracture of left tibial plateau   Closed fracture of left tibial plateau  Closed bicondylar fracture of left tibial plateau:  -Orthopedics following - ORIF of Tibial fracture on L 6/16 --> patient tolerated procedure quite well, pain well controlled, states her nerve block is still intact -Continue PT OT, likely disposition SNF pending clinical course  Hypertension: Continue home medications including amlodipine  Hyperlipidemia: On pravastatin at home.  DVT prophylaxis:SCD Code Status: Full Family Communication: None at bedside Status is: Inpatient  Remains inpatient appropriate because: Need for ongoing evaluation, safe disposition  Dispo: The patient is from: ALF              Anticipated d/c is to: ALF              Patient currently is currently medically stable for discharge awaiting ortho evaluation and approval for discharge back to facility/bed availability   Difficult to place patient No  Consultants: Orthopedics  Procedures:None  Antimicrobials:  Anti-infectives (From admission, onward)    Start     Dose/Rate Route Frequency Ordered Stop   05/25/21 2100  ceFAZolin (ANCEF) IVPB 2g/100 mL  premix        2 g 200 mL/hr over 30 Minutes Intravenous Every 8 hours 05/25/21 1727 05/26/21 1326   05/25/21 1223  vancomycin (VANCOCIN) powder  Status:  Discontinued          As needed 05/25/21 1224 05/25/21 1456   05/25/21 1130  ceFAZolin (ANCEF) IVPB 2g/100 mL premix        2 g 200 mL/hr over 30 Minutes Intravenous  Once 05/25/21 1124 05/25/21 1223   05/25/21 1125  ceFAZolin (ANCEF) 2-4 GM/100ML-% IVPB       Note to Pharmacy: Ladoris Gene   : cabinet override      05/25/21 1125 05/25/21 1241       Subjective:  No acute issues or events overnight denies nausea vomiting diarrhea constipation headache fevers or chills.  Only complaint today is left lower extremity numbness in the setting of nerve block otherwise somewhat excited for therapy  Objective: Vitals:   05/26/21 0814 05/26/21 1245 05/26/21 2100 05/27/21 0533  BP: (!) 156/69 (!) 178/81 (!) 155/73 (!) 174/69  Pulse: 63 71 64 66  Resp: 18 18 16 16   Temp: 99 F (37.2 C) 98.7 F (37.1 C) 99.1 F (37.3 C) 98.2 F (36.8 C)  TempSrc: Oral Oral Oral Oral  SpO2: 97% 100% 98% 99%  Weight:      Height:        Intake/Output Summary (Last 24 hours) at 05/27/2021 0735 Last data filed at 05/26/2021 2102 Gross per 24 hour  Intake 600 ml  Output 1150 ml  Net -550 ml  Filed Weights   05/23/21 2206  Weight: 74.8 kg    Examination:  General:  Pleasantly resting in bed, No acute distress. HEENT:  Normocephalic atraumatic.  Sclerae nonicteric, noninjected.  Extraocular movements intact bilaterally. Neck:  Without mass or deformity.  Trachea is midline. Lungs:  Clear to auscultate bilaterally without rhonchi, wheeze, or rales. Heart:  Regular rate and rhythm.  Without murmurs, rubs, or gallops. Abdomen:  Soft, nontender, nondistended.  Without guarding or rebound. Extremities: Without cyanosis, clubbing, edema, left lower extremity binder in place mid thigh to distal calf, diminished sensation of the left lower extremity  distal to the bandage Vascular:  Dorsalis pedis and posterior tibial pulses palpable bilaterally. Skin:  Warm and dry, no erythema, no ulcerations.  Data Reviewed: I have personally reviewed following labs and imaging studies  CBC: Recent Labs  Lab 05/24/21 0154 05/24/21 0334 05/25/21 0324 05/25/21 1803 05/26/21 0222  WBC 7.2 9.2 7.9 9.3 9.5  NEUTROABS  --  7.2  --   --   --   HGB 12.9 13.4 13.1 13.5 12.5  HCT 38.4 38.4 37.8 38.0 35.0*  MCV 93.0 86.9 88.5 88.2 86.4  PLT 13* 160 151 148* 149*    Basic Metabolic Panel: Recent Labs  Lab 05/24/21 0154 05/25/21 0324 05/25/21 1803 05/26/21 0222  NA 137 140  --  139  K 3.8 3.7  --  3.4*  CL 110 107  --  102  CO2 21* 29  --  27  GLUCOSE 129* 147*  --  162*  BUN 18 8  --  9  CREATININE 0.88 0.76 0.80 0.78  CALCIUM 7.7* 8.5*  --  8.9    GFR: Estimated Creatinine Clearance: 67 mL/min (by C-G formula based on SCr of 0.78 mg/dL). Liver Function Tests: Recent Labs  Lab 05/24/21 0334  AST 22  ALT 22  ALKPHOS 78  BILITOT 0.5  PROT 6.6  ALBUMIN 3.8    No results for input(s): LIPASE, AMYLASE in the last 168 hours. No results for input(s): AMMONIA in the last 168 hours. Coagulation Profile: Recent Labs  Lab 05/24/21 0334  INR 1.0    Cardiac Enzymes: No results for input(s): CKTOTAL, CKMB, CKMBINDEX, TROPONINI in the last 168 hours. BNP (last 3 results) No results for input(s): PROBNP in the last 8760 hours. HbA1C: No results for input(s): HGBA1C in the last 72 hours. CBG: No results for input(s): GLUCAP in the last 168 hours. Lipid Profile: No results for input(s): CHOL, HDL, LDLCALC, TRIG, CHOLHDL, LDLDIRECT in the last 72 hours. Thyroid Function Tests: No results for input(s): TSH, T4TOTAL, FREET4, T3FREE, THYROIDAB in the last 72 hours. Anemia Panel: No results for input(s): VITAMINB12, FOLATE, FERRITIN, TIBC, IRON, RETICCTPCT in the last 72 hours. Sepsis Labs: No results for input(s): PROCALCITON,  LATICACIDVEN in the last 168 hours.  Recent Results (from the past 240 hour(s))  Resp Panel by RT-PCR (Flu A&B, Covid) Nasopharyngeal Swab     Status: None   Collection Time: 05/24/21  1:54 AM   Specimen: Nasopharyngeal Swab; Nasopharyngeal(NP) swabs in vial transport medium  Result Value Ref Range Status   SARS Coronavirus 2 by RT PCR NEGATIVE NEGATIVE Final    Comment: (NOTE) SARS-CoV-2 target nucleic acids are NOT DETECTED.  The SARS-CoV-2 RNA is generally detectable in upper respiratory specimens during the acute phase of infection. The lowest concentration of SARS-CoV-2 viral copies this assay can detect is 138 copies/mL. A negative result does not preclude SARS-Cov-2 infection and should not be used  as the sole basis for treatment or other patient management decisions. A negative result may occur with  improper specimen collection/handling, submission of specimen other than nasopharyngeal swab, presence of viral mutation(s) within the areas targeted by this assay, and inadequate number of viral copies(<138 copies/mL). A negative result must be combined with clinical observations, patient history, and epidemiological information. The expected result is Negative.  Fact Sheet for Patients:  EntrepreneurPulse.com.au  Fact Sheet for Healthcare Providers:  IncredibleEmployment.be  This test is no t yet approved or cleared by the [place] FDA and  has been authorized for detection and/or diagnosis of SARS-CoV-2 by FDA under an Emergency Use Authorization (EUA). This EUA will remain  in effect (meaning this test can be used) for the duration of the COVID-19 declaration under Section 564(b)(1) of the Act, 21 U.S.C.section 360bbb-3(b)(1), unless the authorization is terminated  or revoked sooner.       Influenza A by PCR NEGATIVE NEGATIVE Final   Influenza B by PCR NEGATIVE NEGATIVE Final    Comment: (NOTE) The Xpert Xpress  SARS-CoV-2/FLU/RSV plus assay is intended as an aid in the diagnosis of influenza from Nasopharyngeal swab specimens and should not be used as a sole basis for treatment. Nasal washings and aspirates are unacceptable for Xpert Xpress SARS-CoV-2/FLU/RSV testing.  Fact Sheet for Patients: EntrepreneurPulse.com.au  Fact Sheet for Healthcare Providers: IncredibleEmployment.be  This test is not yet approved or cleared by the [place] FDA and has been authorized for detection and/or diagnosis of SARS-CoV-2 by FDA under an Emergency Use Authorization (EUA). This EUA will remain in effect (meaning this test can be used) for the duration of the COVID-19 declaration under Section 564(b)(1) of the Act, 21 U.S.C. section 360bbb-3(b)(1), unless the authorization is terminated or revoked.  Performed at [facility], [place] [address] [place], [place] [address]           Radiology Studies: DG Knee Complete 4 Views Left  Result Date: 05/25/2021 CLINICAL DATA:  ORIF left tibial plateau. EXAM: DG C-ARM 1-60 MIN; LEFT KNEE - COMPLETE 4+ VIEW FLUOROSCOPY TIME:  Fluoroscopy Time:  2 minutes and 15 seconds. Radiation Exposure Index (if provided by the fluoroscopic device): 4.32 mGy. Number of Acquired Spot Images: 10 COMPARISON:  May 23, 2021. FINDINGS: Thin C-arm fluoroscopic images were obtained intraoperatively and submitted for post operative interpretation. These images demonstrate plate and screw fixation a comminuted proximal tibial fracture. Improved, near anatomic alignment. No unexpected findings. Expected overlying soft tissue swelling and gas. Please see the performing provider's procedural report for further detail. IMPRESSION: Intraoperative fluoroscopy, as detailed above. Electronically Signed   By: Margaretha Sheffield MD   On: 05/25/2021 15:38   DG Knee Left Port  Result Date: 05/25/2021 CLINICAL DATA:  Postop EXAM: PORTABLE LEFT  KNEE - 1-2 VIEW COMPARISON:  05/25/2021, CT 05/24/2021, 05/23/2021 FINDINGS: Interval surgical plate and multiple screw fixation of comminuted intra-articular tibial fracture with anatomic alignment. Three additional linear metallic fragments at the articular surface of the lateral tibial plateau. Air in the soft tissues consistent with recent surgery. IMPRESSION: Interval surgical fixation of comminuted tibial plateau fractures. Electronically Signed   By: Donavan Foil M.D.   On: 05/25/2021 17:31   DG C-Arm 1-60 Min  Result Date: 05/25/2021 CLINICAL DATA:  ORIF left tibial plateau. EXAM: DG C-ARM 1-60 MIN; LEFT KNEE - COMPLETE 4+ VIEW FLUOROSCOPY TIME:  Fluoroscopy Time:  2 minutes and 15 seconds. Radiation Exposure Index (if provided by the fluoroscopic device): 4.32 mGy.  Number of Acquired Spot Images: 10 COMPARISON:  May 23, 2021. FINDINGS: Thin C-arm fluoroscopic images were obtained intraoperatively and submitted for post operative interpretation. These images demonstrate plate and screw fixation a comminuted proximal tibial fracture. Improved, near anatomic alignment. No unexpected findings. Expected overlying soft tissue swelling and gas. Please see the performing provider's procedural report for further detail. IMPRESSION: Intraoperative fluoroscopy, as detailed above. Electronically Signed   By: Margaretha Sheffield MD   On: 05/25/2021 15:38        Scheduled Meds:  amLODipine  2.5 mg Oral Daily   cholecalciferol  1,000 Units Oral Daily   docusate sodium  100 mg Oral BID   enoxaparin (LOVENOX) injection  40 mg Subcutaneous Q24H   pravastatin  20 mg Oral Daily   Continuous Infusions:  sodium chloride 75 mL/hr at 05/26/21 0841   methocarbamol (ROBAXIN) IV       LOS: 3 days    Time spent: 25 min   Little Ishikawa, DO [facility] P6/18/2022, 7:35 AM

## 2021-05-28 LAB — BASIC METABOLIC PANEL
Anion gap: 6 (ref 5–15)
BUN: 10 mg/dL (ref 8–23)
CO2: 30 mmol/L (ref 22–32)
Calcium: 8.6 mg/dL — ABNORMAL LOW (ref 8.9–10.3)
Chloride: 103 mmol/L (ref 98–111)
Creatinine, Ser: 0.8 mg/dL (ref 0.44–1.00)
GFR, Estimated: >60 mL/min (ref >60)
Glucose, Bld: 119 mg/dL — ABNORMAL HIGH (ref 70–99)
Potassium: 3.4 mmol/L — ABNORMAL LOW (ref 3.5–5.1)
Sodium: 139 mmol/L (ref 135–145)

## 2021-05-28 NOTE — Discharge Summary (Signed)
Physician Discharge Summary  Julie Moran WLN:[id number] DOB: [birth year] DOA: 05/23/2021  PCP: Leanna Battles, MD  Admit date: 05/23/2021 Discharge date: 05/28/2021  Admitted From: ALF Disposition:  SNF  Recommendations for Outpatient Follow-up:  Follow up with PCP in 1-2 weeks Please obtain BMP/CBC in one week Please follow up with ortho as scheduled  Discharge Condition:Stablee  CODE STATUS:Full  Diet recommendation:  As tolerated  Brief/Interim Summary: Patient is a 65 year old female with history of hypertension .HLD who presented to the emergency department with complaints of acute onset of left knee pain and inability to ambulate after she had an accidental mechanical fall while she was dancing.  Left knee CT showed complex comminuted proximal tibial fracture involving the tibial spines, both tibial plateaus and extending into the tibial shaft.  She has been admitted for fracture management.  Orthopedics consulted and the plan is for ORIF.  She has been transferred to [facility] for the complexity of the fracture.  Status post ORIF 6/16 with ortho and tolerated the procedure well - no acute issues/events overnight - otherwise stable for discharge to SNF for ongoing mangagement - nonweightbearing per ortho at this time. Follow up with PCP and ortho outpatient as scheduled.  Discharge Diagnoses:  Active Problems:   Closed bicondylar fracture of left tibial plateau   Closed fracture of left tibial plateau    Discharge Instructions  Discharge Instructions     Call MD for:  redness, tenderness, or signs of infection (pain, swelling, redness, odor or green/yellow discharge around incision site)   Complete by: As directed    Call MD for:  temperature >100.4   Complete by: As directed    Diet - low sodium heart healthy   Complete by: As directed    Increase activity slowly   Complete by: As directed    Leave dressing on - Keep it clean, dry, and intact until clinic  visit   Complete by: As directed       Allergies as of 05/28/2021       Reactions   No Known Allergies         Medication List     TAKE these medications    amLODipine 2.5 MG tablet Commonly known as: NORVASC Take 1 tablet by mouth Daily.   enoxaparin 40 MG/0.4ML injection Commonly known as: LOVENOX Inject 0.4 mLs (40 mg total) into the skin daily for 30 doses. For 30 days post op for DVT prophylaxis   HYDROcodone-acetaminophen 5-325 MG tablet Commonly known as: Norco Take 1-2 tablets by mouth every 6 (six) hours as needed for moderate pain. MAXIMUM TOTAL ACETAMINOPHEN DOSE IS 4000 MG PER DAY   pravastatin 20 MG tablet Commonly known as: PRAVACHOL Take 20 mg by mouth daily.   vitamin C 250 MG tablet Commonly known as: ASCORBIC ACID Take 250 mg by mouth daily.   Vitamin D (Ergocalciferol) 1.25 MG (50000 UNIT) Caps capsule Commonly known as: DRISDOL Take 1 capsule (50,000 Units total) by mouth every 7 (seven) days.   Vitamin D 50 MCG (2000 UT) Caps Take 2,000 Units by mouth daily.               Discharge Care Instructions  (From admission, onward)           Start     Ordered   05/28/21 0000  Leave dressing on - Keep it clean, dry, and intact until clinic visit        05/28/21 0731  Follow-up Information     Haddix, Thomasene Lot, MD. Schedule an appointment as soon as possible for a visit in 2 week(s).   Specialty: Orthopedic Surgery Contact information: [place] [address] [phone]                Allergies  Allergen Reactions   No Known Allergies     Consultations: Ortho  Procedures/Studies: DG Tibia/Fibula Left  Result Date: 05/23/2021 CLINICAL DATA:  Fall EXAM: LEFT TIBIA AND FIBULA - 2 VIEW COMPARISON:  Knee radiograph 05/23/2021 FINDINGS: Mid to distal tibia appear intact. Highly comminuted intra-articular fracture involving proximal tibia. Large lipohemarthrosis. IMPRESSION: Highly  comminuted intra-articular fracture involving the proximal tibia with large lipohemarthrosis Electronically Signed   By: Donavan Foil M.D.   On: 05/23/2021 23:43   CT Head Wo Contrast  Result Date: 05/24/2021 CLINICAL DATA:  Fall while dancing, low platelets, EXAM: CT HEAD WITHOUT CONTRAST TECHNIQUE: Contiguous axial images were obtained from the base of the skull through the vertex without intravenous contrast. COMPARISON:  None. FINDINGS: Brain: No evidence of acute infarction, hemorrhage, hydrocephalus, extra-axial collection. Question a rounded density along the left cerebellar pontine angle, could reflect a small meningioma. No other visible or suspected mass. No midline shift. Basal cisterns are patent. Patchy areas of white matter hypoattenuation are most compatible with chronic microvascular angiopathy. Symmetric prominence of the ventricles, cisterns and sulci compatible with parenchymal volume loss. Partially empty sella. Vascular: Atherosclerotic calcification of the carotid siphons. No hyperdense vessel. Skull: No calvarial fracture or suspicious osseous lesion. No scalp swelling or hematoma. Sinuses/Orbits: Paranasal sinuses and mastoid air cells are predominantly clear. Included orbital structures are unremarkable. Other: None IMPRESSION: No significant scalp swelling or calvarial fracture. No acute intracranial hemorrhage or other traumatic findings. Questionable ovoid mass along the left cerebellopontine angle measuring 1.4 x 0.6 x 1 cm in size, possible meningioma. Outpatient MRI with and without contrast could be confirmatory. Partially empty sella. Electronically Signed   By: Lovena Le M.D.   On: 05/24/2021 05:00   CT Knee Left Wo Contrast  Result Date: 05/24/2021 CLINICAL DATA:  Tibial plateau fracture EXAM: CT OF THE LEFT KNEE WITHOUT CONTRAST TECHNIQUE: Multidetector CT imaging of the left knee was performed according to the standard protocol. Multiplanar CT image reconstructions  were also generated. COMPARISON:  Plain films 05/23/2021 FINDINGS: Complex, comminuted fracture noted through the proximal tibia involving the tibial spines chest and extending into both tibial plateaus. There is a large medial fragment which is displaced medially. The femur is also displaced medially relative to the lateral tibial plateau. The fracture extends distally into the tibial shaft. Lipohemarthrosis noted. IMPRESSION: Complex comminuted proximal tibial fracture involving the tibial spines, both tibial plateaus and extending into the tibial shaft. Medial displacement of the medial tibial plateau and femur. Electronically Signed   By: Rolm Baptise M.D.   On: 05/24/2021 02:19   MR ABDOMEN WWO CONTRAST  Result Date: 05/07/2021 CLINICAL DATA:  Renal cyst, Birt-Hogg-Dube syndrome EXAM: MRI ABDOMEN WITHOUT AND WITH CONTRAST TECHNIQUE: Multiplanar multisequence MR imaging of the abdomen was performed both before and after the administration of intravenous contrast. CONTRAST:  68mL MULTIHANCE GADOBENATE DIMEGLUMINE 529 MG/ML IV SOLN COMPARISON:  09/12/2019 FINDINGS: Lower chest: Lung bases are clear. Hepatobiliary: Liver is within normal limits. No morphologic findings of cirrhosis. No hepatic steatosis. Status post cholecystectomy. No intrahepatic or extrahepatic ductal dilatation. Pancreas:  Within normal limits. Spleen:  Within normal limits. Adrenals/Urinary Tract:  Adrenal glands are within  normal limits. Scattered tiny cortical cysts in the kidneys bilaterally, measuring up to 7 mm in the anterior right upper kidney (series 8/image 22), benign (Bosniak I). No suspicious/enhancing renal lesions. No hydronephrosis. Stomach/Bowel: Stomach is within normal limits. Visualized bowel is unremarkable. Vascular/Lymphatic:  No evidence of abdominal aortic aneurysm. No suspicious abdominal lymphadenopathy. Other:  No abdominal ascites. Musculoskeletal: No focal osseous lesions. IMPRESSION: Small bilateral renal  cysts measuring up to 7 mm in the anterior right upper kidney, benign (Bosniak I). No enhancing renal lesions. Status post cholecystectomy. Electronically Signed   By: Julian Hy M.D.   On: 05/07/2021 10:15   DG Knee Complete 4 Views Left  Result Date: 05/25/2021 CLINICAL DATA:  ORIF left tibial plateau. EXAM: DG C-ARM 1-60 MIN; LEFT KNEE - COMPLETE 4+ VIEW FLUOROSCOPY TIME:  Fluoroscopy Time:  2 minutes and 15 seconds. Radiation Exposure Index (if provided by the fluoroscopic device): 4.32 mGy. Number of Acquired Spot Images: 10 COMPARISON:  May 23, 2021. FINDINGS: Thin C-arm fluoroscopic images were obtained intraoperatively and submitted for post operative interpretation. These images demonstrate plate and screw fixation a comminuted proximal tibial fracture. Improved, near anatomic alignment. No unexpected findings. Expected overlying soft tissue swelling and gas. Please see the performing provider's procedural report for further detail. IMPRESSION: Intraoperative fluoroscopy, as detailed above. Electronically Signed   By: Margaretha Sheffield MD   On: 05/25/2021 15:38   DG Knee Left Port  Result Date: 05/25/2021 CLINICAL DATA:  Postop EXAM: PORTABLE LEFT KNEE - 1-2 VIEW COMPARISON:  05/25/2021, CT 05/24/2021, 05/23/2021 FINDINGS: Interval surgical plate and multiple screw fixation of comminuted intra-articular tibial fracture with anatomic alignment. Three additional linear metallic fragments at the articular surface of the lateral tibial plateau. Air in the soft tissues consistent with recent surgery. IMPRESSION: Interval surgical fixation of comminuted tibial plateau fractures. Electronically Signed   By: Donavan Foil M.D.   On: 05/25/2021 17:31   DG Knee Left Port  Result Date: 05/23/2021 CLINICAL DATA:  Fall, deformity, swelling EXAM: PORTABLE LEFT KNEE - 1-2 VIEW COMPARISON:  None. FINDINGS: There is a proximal tibial fracture extending into the knee joint at the tibial spines. This  extends into the metaphysis with a linear extension into the proximal diaphysis. Significant displacement of the medial tibial fragment. Joint effusion with lipohemarthrosis. IMPRESSION: Displaced proximal tibial fracture extending from the tibial spines distally into the metadiaphysis. Electronically Signed   By: Rolm Baptise M.D.   On: 05/23/2021 22:31   DG C-Arm 1-60 Min  Result Date: 05/25/2021 CLINICAL DATA:  ORIF left tibial plateau. EXAM: DG C-ARM 1-60 MIN; LEFT KNEE - COMPLETE 4+ VIEW FLUOROSCOPY TIME:  Fluoroscopy Time:  2 minutes and 15 seconds. Radiation Exposure Index (if provided by the fluoroscopic device): 4.32 mGy. Number of Acquired Spot Images: 10 COMPARISON:  May 23, 2021. FINDINGS: Thin C-arm fluoroscopic images were obtained intraoperatively and submitted for post operative interpretation. These images demonstrate plate and screw fixation a comminuted proximal tibial fracture. Improved, near anatomic alignment. No unexpected findings. Expected overlying soft tissue swelling and gas. Please see the performing provider's procedural report for further detail. IMPRESSION: Intraoperative fluoroscopy, as detailed above. Electronically Signed   By: Margaretha Sheffield MD   On: 05/25/2021 15:38   DG Hip Unilat With Pelvis 2-3 Views Left  Result Date: 05/23/2021 CLINICAL DATA:  Fall with hip pain EXAM: DG HIP (WITH OR WITHOUT PELVIS) 2-3V LEFT COMPARISON:  None. FINDINGS: SI joints are non widened. Pubic symphysis and rami appear intact. No  fracture or malalignment. IMPRESSION: No acute osseous abnormality Electronically Signed   By: Donavan Foil M.D.   On: 05/23/2021 23:42     Subjective: No acute issues/events overnight   Discharge Exam: Vitals:   05/27/21 0533 05/27/21 2300  BP: (!) 174/69 (!) 159/64  Pulse: 66 65  Resp: 16 16  Temp: 98.2 F (36.8 C) 99 F (37.2 C)  SpO2: 99% 94%   Vitals:   05/26/21 2056 05/26/21 2100 05/27/21 0533 05/27/21 2300  BP: (!) 155/73 (!) 155/73  (!) 174/69 (!) 159/64  Pulse: 64 64 66 65  Resp: 16 16 16 16   Temp: 99.1 F (37.3 C) 99.1 F (37.3 C) 98.2 F (36.8 C) 99 F (37.2 C)  TempSrc: Oral Oral Oral Oral  SpO2: 98% 98% 99% 94%  Weight:      Height:        General: Pt is alert, awake, not in acute distress Cardiovascular: RRR, S1/S2 +, no rubs, no gallops Respiratory: CTA bilaterally, no wheezing, no rhonchi Abdominal: Soft, NT, ND, bowel sounds + Extremities: no edema, no cyanosis; LLE bandage clean dry and intact    The results of significant diagnostics from this hospitalization (including imaging, microbiology, ancillary and laboratory) are listed below for reference.     Microbiology: Recent Results (from the past 240 hour(s))  Resp Panel by RT-PCR (Flu A&B, Covid) Nasopharyngeal Swab     Status: None   Collection Time: 05/24/21  1:54 AM   Specimen: Nasopharyngeal Swab; Nasopharyngeal(NP) swabs in vial transport medium  Result Value Ref Range Status   SARS Coronavirus 2 by RT PCR NEGATIVE NEGATIVE Final    Comment: (NOTE) SARS-CoV-2 target nucleic acids are NOT DETECTED.  The SARS-CoV-2 RNA is generally detectable in upper respiratory specimens during the acute phase of infection. The lowest concentration of SARS-CoV-2 viral copies this assay can detect is 138 copies/mL. A negative result does not preclude SARS-Cov-2 infection and should not be used as the sole basis for treatment or other patient management decisions. A negative result may occur with  improper specimen collection/handling, submission of specimen other than nasopharyngeal swab, presence of viral mutation(s) within the areas targeted by this assay, and inadequate number of viral copies(<138 copies/mL). A negative result must be combined with clinical observations, patient history, and epidemiological information. The expected result is Negative.  Fact Sheet for Patients:  EntrepreneurPulse.com.au  Fact Sheet for  Healthcare Providers:  IncredibleEmployment.be  This test is no t yet approved or cleared by the [place] FDA and  has been authorized for detection and/or diagnosis of SARS-CoV-2 by FDA under an Emergency Use Authorization (EUA). This EUA will remain  in effect (meaning this test can be used) for the duration of the COVID-19 declaration under Section 564(b)(1) of the Act, 21 U.S.C.section 360bbb-3(b)(1), unless the authorization is terminated  or revoked sooner.       Influenza A by PCR NEGATIVE NEGATIVE Final   Influenza B by PCR NEGATIVE NEGATIVE Final    Comment: (NOTE) The Xpert Xpress SARS-CoV-2/FLU/RSV plus assay is intended as an aid in the diagnosis of influenza from Nasopharyngeal swab specimens and should not be used as a sole basis for treatment. Nasal washings and aspirates are unacceptable for Xpert Xpress SARS-CoV-2/FLU/RSV testing.  Fact Sheet for Patients: EntrepreneurPulse.com.au  Fact Sheet for Healthcare Providers: IncredibleEmployment.be  This test is not yet approved or cleared by the [place] FDA and has been authorized for detection and/or diagnosis of SARS-CoV-2 by FDA under an Emergency Use  Authorization (EUA). This EUA will remain in effect (meaning this test can be used) for the duration of the COVID-19 declaration under Section 564(b)(1) of the Act, 21 U.S.C. section 360bbb-3(b)(1), unless the authorization is terminated or revoked.  Performed at [facility], [place] [address], [place], [place] [address]      Labs: BNP (last 3 results) No results for input(s): BNP in the last 8760 hours. Basic Metabolic Panel: Recent Labs  Lab 05/24/21 0154 05/25/21 0324 05/25/21 1803 05/26/21 0222 05/27/21 0739 05/28/21 0135  NA 137 140  --  139 138 139  K 3.8 3.7  --  3.4* 3.2* 3.4*  CL 110 107  --  102 102 103  CO2 21* 29  --  27 30 30   GLUCOSE 129* 147*  --  162*  107* 119*  BUN 18 8  --  9 7* 10  CREATININE 0.88 0.76 0.80 0.78 0.76 0.80  CALCIUM 7.7* 8.5*  --  8.9 8.3* 8.6*   Liver Function Tests: Recent Labs  Lab 05/24/21 0334  AST 22  ALT 22  ALKPHOS 78  BILITOT 0.5  PROT 6.6  ALBUMIN 3.8   No results for input(s): LIPASE, AMYLASE in the last 168 hours. No results for input(s): AMMONIA in the last 168 hours. CBC: Recent Labs  Lab 05/24/21 0334 05/25/21 0324 05/25/21 1803 05/26/21 0222 05/27/21 0739  WBC 9.2 7.9 9.3 9.5 8.2  NEUTROABS 7.2  --   --   --   --   HGB 13.4 13.1 13.5 12.5 11.4*  HCT 38.4 37.8 38.0 35.0* 32.6*  MCV 86.9 88.5 88.2 86.4 88.3  PLT 160 151 148* 149* 147*   Cardiac Enzymes: No results for input(s): CKTOTAL, CKMB, CKMBINDEX, TROPONINI in the last 168 hours. BNP: Invalid input(s): POCBNP CBG: No results for input(s): GLUCAP in the last 168 hours. D-Dimer No results for input(s): DDIMER in the last 72 hours. Hgb A1c No results for input(s): HGBA1C in the last 72 hours. Lipid Profile No results for input(s): CHOL, HDL, LDLCALC, TRIG, CHOLHDL, LDLDIRECT in the last 72 hours. Thyroid function studies No results for input(s): TSH, T4TOTAL, T3FREE, THYROIDAB in the last 72 hours.  Invalid input(s): FREET3 Anemia work up No results for input(s): VITAMINB12, FOLATE, FERRITIN, TIBC, IRON, RETICCTPCT in the last 72 hours. Urinalysis    Component Value Date/Time   COLORURINE YELLOW 07/02/2012 0729   APPEARANCEUR CLEAR 07/02/2012 0729   LABSPEC 1.016 07/02/2012 0729   PHURINE 6.0 07/02/2012 0729   GLUCOSEU NEGATIVE 07/02/2012 0729   HGBUR NEGATIVE 07/02/2012 0729   BILIRUBINUR NEGATIVE 07/02/2012 0729   KETONESUR NEGATIVE 07/02/2012 0729   PROTEINUR NEGATIVE 07/02/2012 0729   UROBILINOGEN 0.2 07/02/2012 0729   NITRITE NEGATIVE 07/02/2012 0729   LEUKOCYTESUR LARGE (A) 07/02/2012 0729   Sepsis Labs Invalid input(s): PROCALCITONIN,  WBC,  LACTICIDVEN Microbiology Recent Results (from the past 240  hour(s))  Resp Panel by RT-PCR (Flu A&B, Covid) Nasopharyngeal Swab     Status: None   Collection Time: 05/24/21  1:54 AM   Specimen: Nasopharyngeal Swab; Nasopharyngeal(NP) swabs in vial transport medium  Result Value Ref Range Status   SARS Coronavirus 2 by RT PCR NEGATIVE NEGATIVE Final    Comment: (NOTE) SARS-CoV-2 target nucleic acids are NOT DETECTED.  The SARS-CoV-2 RNA is generally detectable in upper respiratory specimens during the acute phase of infection. The lowest concentration of SARS-CoV-2 viral copies this assay can detect is 138 copies/mL. A negative result does not preclude SARS-Cov-2 infection  and should not be used as the sole basis for treatment or other patient management decisions. A negative result may occur with  improper specimen collection/handling, submission of specimen other than nasopharyngeal swab, presence of viral mutation(s) within the areas targeted by this assay, and inadequate number of viral copies(<138 copies/mL). A negative result must be combined with clinical observations, patient history, and epidemiological information. The expected result is Negative.  Fact Sheet for Patients:  EntrepreneurPulse.com.au  Fact Sheet for Healthcare Providers:  IncredibleEmployment.be  This test is no t yet approved or cleared by the [place] FDA and  has been authorized for detection and/or diagnosis of SARS-CoV-2 by FDA under an Emergency Use Authorization (EUA). This EUA will remain  in effect (meaning this test can be used) for the duration of the COVID-19 declaration under Section 564(b)(1) of the Act, 21 U.S.C.section 360bbb-3(b)(1), unless the authorization is terminated  or revoked sooner.       Influenza A by PCR NEGATIVE NEGATIVE Final   Influenza B by PCR NEGATIVE NEGATIVE Final    Comment: (NOTE) The Xpert Xpress SARS-CoV-2/FLU/RSV plus assay is intended as an aid in the diagnosis of influenza from  Nasopharyngeal swab specimens and should not be used as a sole basis for treatment. Nasal washings and aspirates are unacceptable for Xpert Xpress SARS-CoV-2/FLU/RSV testing.  Fact Sheet for Patients: EntrepreneurPulse.com.au  Fact Sheet for Healthcare Providers: IncredibleEmployment.be  This test is not yet approved or cleared by the [place] FDA and has been authorized for detection and/or diagnosis of SARS-CoV-2 by FDA under an Emergency Use Authorization (EUA). This EUA will remain in effect (meaning this test can be used) for the duration of the COVID-19 declaration under Section 564(b)(1) of the Act, 21 U.S.C. section 360bbb-3(b)(1), unless the authorization is terminated or revoked.  Performed at [facility], [place] [address] [place], [place] [address]      Time coordinating discharge: Over 30 minutes  SIGNED:   Little Ishikawa, DO [facility] 05/28/2021, 7:31 AM Pager   If 7PM-7AM, please contact night-coverage [email or web address]

## 2021-05-28 NOTE — TOC Transition Note (Signed)
Transition of Care [facility]) - CM/SW Discharge Note   Patient Details  Name: Julie Moran MRN: [id number] Date of Birth: 1956/02/08  Transition of [facility] CM/SW Contact:  Elliot Gurney [place], [place] Phone Number: [phone] 05/28/2021, 9:46 AM   Clinical Narrative:    Patient to discharge back to [place] at [facility]. Patient to be transported by [facility]. Discharge Summary faxed to Collie Siad at [facility]. RN to call report to [phone].  Millianna Szymborski, LCSW Transitions of Care [phone]     Barriers to Discharge: Continued Medical Work up   Patient Goals and CMS Choice        Discharge Placement                       Discharge Plan and Services                                     Social Determinants of Health (SDOH) Interventions     Readmission Risk Interventions No flowsheet data found.

## 2021-05-28 NOTE — Progress Notes (Signed)
Called [facility] and report given to nurse Cheri.

## 2021-05-28 NOTE — Progress Notes (Signed)
SPORTS MEDICINE AND JOINT REPLACEMENT  Lara Mulch, MD    Carlyon Shadow, PA-C [place], [place], [place]  [address]                             [phone]   PROGRESS NOTE  Subjective:  negative for Chest Pain  negative for Shortness of Breath  negative for Nausea/Vomiting   negative for Calf Pain  negative for Bowel Movement   Tolerating Diet: yes         Patient reports pain as 3 on 0-10 scale.    Objective: Vital signs in last 24 hours:   Patient Vitals for the past 24 hrs:  BP Temp Temp src Pulse Resp SpO2  05/27/21 2300 (!) 159/64 99 F (37.2 C) Oral 65 16 94 %    @flow {1959:LAST@   Intake/Output from previous day:   06/18 0701 - 06/19 0700 In: 600 [P.O.:600] Out: -    Intake/Output this shift:   No intake/output data recorded.   Intake/Output      06/18 0701 06/19 0700 06/19 0701 06/20 0700   P.O. 600    Total Intake(mL/kg) 600 (8)    Urine (mL/kg/hr)     Total Output     Net +600         Urine Occurrence 9 x    Stool Occurrence 1 x       LABORATORY DATA: Recent Labs    05/24/21 0154 05/24/21 0334 05/25/21 0324 05/25/21 1803 05/26/21 0222 05/27/21 0739  WBC 7.2 9.2 7.9 9.3 9.5 8.2  HGB 12.9 13.4 13.1 13.5 12.5 11.4*  HCT 38.4 38.4 37.8 38.0 35.0* 32.6*  PLT 13* 160 151 148* 149* 147*   Recent Labs    05/24/21 0154 05/25/21 0324 05/25/21 1803 05/26/21 0222 05/27/21 0739 05/28/21 0135  NA 137 140  --  139 138 139  K 3.8 3.7  --  3.4* 3.2* 3.4*  CL 110 107  --  102 102 103  CO2 21* 29  --  27 30 30   BUN 18 8  --  9 7* 10  CREATININE 0.88 0.76 0.80 0.78 0.76 0.80  GLUCOSE 129* 147*  --  162* 107* 119*  CALCIUM 7.7* 8.5*  --  8.9 8.3* 8.6*   Lab Results  Component Value Date   INR 1.0 05/24/2021    Examination:  General appearance: alert, cooperative, and no distress Extremities: extremities normal, atraumatic, no cyanosis or edema  Wound Exam: clean, dry, intact   Drainage:  None: wound tissue dry  Motor  Exam: Quadriceps and Hamstrings Intact  Sensory Exam: Superficial Peroneal, Deep Peroneal, and Tibial normal   Assessment:    3 Days Post-Op  Procedure(s) (LRB): OPEN REDUCTION INTERNAL FIXATION  TIBIAL PLATEAU (Left)  ADDITIONAL DIAGNOSIS:  Active Problems:   Closed bicondylar fracture of left tibial plateau   Closed fracture of left tibial plateau     Plan: Physical Therapy as ordered Non Weight Bearing (NWB)  DVT Prophylaxis:  Lovenox  DISCHARGE PLAN: Skilled Nursing Facility/Rehab  Plan for D/C today SNF ([facility])  Donia Ast 05/28/2021, 7:05 AM

## 2021-05-28 NOTE — Plan of Care (Signed)
?  Problem: Activity: ?Goal: Risk for activity intolerance will decrease ?Outcome: Progressing ?  ?Problem: Safety: ?Goal: Ability to remain free from injury will improve ?Outcome: Progressing ?  ?Problem: Pain Managment: ?Goal: General experience of comfort will improve ?Outcome: Progressing ?  ?

## 2021-05-29 ENCOUNTER — Non-Acute Institutional Stay (SKILLED_NURSING_FACILITY): Payer: Medicare PPO | Admitting: Internal Medicine

## 2021-05-29 ENCOUNTER — Encounter: Payer: Self-pay | Admitting: Internal Medicine

## 2021-05-29 DIAGNOSIS — E559 Vitamin D deficiency, unspecified: Secondary | ICD-10-CM

## 2021-05-29 DIAGNOSIS — I10 Essential (primary) hypertension: Secondary | ICD-10-CM | POA: Diagnosis not present

## 2021-05-29 DIAGNOSIS — E876 Hypokalemia: Secondary | ICD-10-CM

## 2021-05-29 DIAGNOSIS — S82142S Displaced bicondylar fracture of left tibia, sequela: Secondary | ICD-10-CM | POA: Diagnosis not present

## 2021-05-29 DIAGNOSIS — E7849 Other hyperlipidemia: Secondary | ICD-10-CM | POA: Diagnosis not present

## 2021-05-29 NOTE — OR Nursing (Signed)
Reviewed implant log with sales rep.  She specifically remembers re-stocking tray and knows that there were 2 VA 75 screws used and only 1 3.5 cortical screws.  Implants corrected to reflect actual usage.

## 2021-05-29 NOTE — Progress Notes (Signed)
Provider:  Veleta Miners MD Location:    [facility] Room Number: 086 Place of Service:  SNF (31)  PCP: Leanna Battles, MD Patient Care Team: Leanna Battles, MD as PCP - General (Internal Medicine)  Extended Emergency Contact Information Primary Emergency Contact: Page,Katie Address: [address]          [place], [place] [address] Johnnette Litter of [place] Phone: [phone] Relation: Daughter Secondary Emergency Contact: [place] Mobile Phone: [phone] Relation: Friend  Code Status: Full Code Goals of Care: Advanced Directive information Advanced Directives 05/29/2021  Does Patient Have a Medical Advance Directive? Yes  Type of Paramedic of [place];Living will  Does patient want to make changes to medical advance directive? No - Patient declined  Copy of [place] in Chart? No - copy requested      Chief Complaint  Patient presents with   New Admit To SNF    Admission to SNF    HPI: Patient is a 65 y.o. female seen today for admission to SNF for therapy  Admitted to the hospital from 6/14-6/19 after Fall and sustaining Left Proximal Tibial Fracture  Patient has h/o HTN and HLD  Had Accidental Mechanical Fall while dancing in [place]  Underwent ORIF on 6/16 Post op was uneventful She is Suppose to NWB for 6 weeks  Continues to have pain in that leg but controlled with her Meds Incision looks clean and most healed Does have swelling in that leg No SOB Bowels finally moved today  Past Medical History:  Diagnosis Date   Allergy    Atrophic vaginitis    Birt-Hogg-Dube syndrome    lung cysts    Birt-Hogg-Dube syndrome    Chest pain    Colon polyps    adenomatous   Constipation    Depression    Diverticulosis    Dry skin    Fatty liver    Floaters    Gall bladder disease    Glaucoma    Heart murmur    Herpes simplex    Hyperlipidemia     Hypertension    Joint pain    Lichen sclerosus    Pancreatitis    Pancreatitis    Pneumothorax, right    spontaneous   Shortness of breath    Shortness of breath on exertion    Swelling of both lower extremities    Trouble in sleeping    Past Surgical History:  Procedure Laterality Date   ABDOMINAL HYSTERECTOMY     BREAST BIOPSY  03/29/2006   BREAST BIOPSY Bilateral 06/14/1998   BREAST EXCISIONAL BIOPSY Bilateral 1999   BREAST LUMPECTOMY  1973,1992,2001   x5 times total , both breasts   CHOLECYSTECTOMY     COLONOSCOPY     2016   epidermoid cyst Right 02/2020   below Right breast   hysterctomy     abdominal   POLYPECTOMY     right ovary removed     UPPER GASTROINTESTINAL ENDOSCOPY      reports that she has never smoked. She has never used smokeless tobacco. She reports that she does not drink alcohol and does not use drugs. Social History   Socioeconomic History   Marital status: Widowed    Spouse name: Not on file   Number of children: 1   Years of education: Not on file   Highest education level: Not on file  Occupational History   Occupation: Retired Environmental education officer: [employer]  Tobacco Use   Smoking status: Never   Smokeless tobacco: Never  Substance and Sexual Activity   Alcohol use: No    Alcohol/week: 0.0 standard drinks   Drug use: No   Sexual activity: Not on file  Other Topics Concern   Not on file  Social History Narrative   Not on file   Social Determinants of Health   Financial Resource Strain: Not on file  Food Insecurity: Not on file  Transportation Needs: Not on file  Physical Activity: Not on file  Stress: Not on file  Social Connections: Not on file  Intimate Partner Violence: Not on file    Functional Status Survey:    Family History  Problem Relation Age of Onset   Diabetes Mother    Breast cancer Mother    CVA Mother    Hypertension Mother    Heart disease Mother    Thyroid disease Mother    Cancer  Mother    Diabetes Father    Prostate cancer Father    Heart disease Father    Hypertension Father    Hyperlipidemia Father    Thyroid disease Father    Cancer Father    Obesity Father    Liver disease Paternal Grandmother    Breast cancer Maternal Aunt        x 3   CVA Paternal Grandfather    Colon cancer Neg Hx    Esophageal cancer Neg Hx    Stomach cancer Neg Hx    Rectal cancer Neg Hx     Health Maintenance  Topic Date Due   COVID-19 Vaccine (1) Never done   Zoster Vaccines- Shingrix (2 of 2) 04/16/2018   DEXA SCAN  Never done   PNA vac Low Risk Adult (2 of 2 - PPSV23) 09/24/2020   INFLUENZA VACCINE  07/10/2021   MAMMOGRAM  05/23/2022   TETANUS/TDAP  03/19/2024   COLONOSCOPY (Pts 45-80yrs Insurance coverage will need to be confirmed)  07/06/2027   Hepatitis C Screening  Completed   HPV VACCINES  Aged Out    Allergies  Allergen Reactions   No Known Allergies     Allergies as of 05/29/2021       Reactions   No Known Allergies         Medication List        Accurate as of May 29, 2021 10:46 AM. If you have any questions, ask your nurse or doctor.          STOP taking these medications    Vitamin D 50 MCG (2000 UT) Caps Stopped by: Virgie Dad, MD       TAKE these medications    amLODipine 2.5 MG tablet Commonly known as: NORVASC Take 1 tablet by mouth Daily.   enoxaparin 40 MG/0.4ML injection Commonly known as: LOVENOX Inject 0.4 mLs (40 mg total) into the skin daily for 30 doses. For 30 days post op for DVT prophylaxis   HYDROcodone-acetaminophen 5-325 MG tablet Commonly known as: Norco Take 1-2 tablets by mouth every 6 (six) hours as needed for moderate pain. MAXIMUM TOTAL ACETAMINOPHEN DOSE IS 4000 MG PER DAY   pravastatin 20 MG tablet Commonly known as: PRAVACHOL Take 20 mg by mouth daily.   vitamin C 250 MG tablet Commonly known as: ASCORBIC ACID Take 250 mg by mouth daily.   Vitamin D (Ergocalciferol) 1.25 MG (50000 UNIT)  Caps capsule Commonly known as: DRISDOL Take 1 capsule (50,000 Units total) by mouth  every 7 (seven) days.        Review of Systems Review of Systems  Constitutional: Negative for activity change, appetite change, chills, diaphoresis, fatigue and fever.  HENT: Negative for mouth sores, postnasal drip, rhinorrhea, sinus pain and sore throat.   Respiratory: Negative for apnea, cough, chest tightness, shortness of breath and wheezing.   Cardiovascular: Negative for chest pain, palpitations   Gastrointestinal: Negative for abdominal distention, abdominal pain, constipation, diarrhea, nausea and vomiting.  Genitourinary: Negative for dysuria and frequency.  Musculoskeletal: Negative for arthralgias, joint swelling and myalgias.  Skin: Negative for rash.  Neurological: Negative for dizziness, syncope, weakness, light-headedness and numbness.  Psychiatric/Behavioral: Negative for behavioral problems, confusion and sleep disturbance.    Vitals:   05/29/21 1021  BP: (!) 143/80  Pulse: 69  Resp: 16  Temp: 98.9 F (37.2 C)  SpO2: 98%  Weight: 165 lb (74.8 kg)  Height: 5\' 3"  (1.6 m)   Body mass index is 29.23 kg/m. Physical Exam Constitutional: Oriented to person, place, and time. Well-developed and well-nourished.  HENT:  Head: Normocephalic.  Mouth/Throat: Oropharynx is clear and moist.  Eyes: Pupils are equal, round, and reactive to light.  Neck: Neck supple.  Cardiovascular: Normal rate and normal heart sounds.  No murmur heard. Pulmonary/Chest: Effort normal and breath sounds normal. No respiratory distress. No wheezes. She has no rales.  Abdominal: Soft. Bowel sounds are normal. No distension. There is no tenderness. There is no rebound.  Musculoskeletal: Left Leg Moderate Swelling present  Lymphadenopathy: none Neurological: Alert and oriented to person, place, and time.  Skin: Skin is warm and dry.  Psychiatric: Normal mood and affect. Behavior is normal. Thought content  normal.   Labs reviewed: Basic Metabolic Panel: Recent Labs    05/26/21 0222 05/27/21 0739 05/28/21 0135  NA 139 138 139  K 3.4* 3.2* 3.4*  CL 102 102 103  CO2 27 30 30   GLUCOSE 162* 107* 119*  BUN 9 7* 10  CREATININE 0.78 0.76 0.80  CALCIUM 8.9 8.3* 8.6*   Liver Function Tests: Recent Labs    05/24/21 0334  AST 22  ALT 22  ALKPHOS 78  BILITOT 0.5  PROT 6.6  ALBUMIN 3.8   No results for input(s): LIPASE, AMYLASE in the last 8760 hours. No results for input(s): AMMONIA in the last 8760 hours. CBC: Recent Labs    05/24/21 0334 05/25/21 0324 05/25/21 1803 05/26/21 0222 05/27/21 0739  WBC 9.2   < > 9.3 9.5 8.2  NEUTROABS 7.2  --   --   --   --   HGB 13.4   < > 13.5 12.5 11.4*  HCT 38.4   < > 38.0 35.0* 32.6*  MCV 86.9   < > 88.2 86.4 88.3  PLT 160   < > 148* 149* 147*   < > = values in this interval not displayed.   Cardiac Enzymes: No results for input(s): CKTOTAL, CKMB, CKMBINDEX, TROPONINI in the last 8760 hours. BNP: Invalid input(s): POCBNP Lab Results  Component Value Date   HGBA1C 5.1 08/24/2020   Lab Results  Component Value Date   TSH 1.470 07/03/2018   Lab Results  Component Value Date   VITAMINB12 431 07/03/2018   Lab Results  Component Value Date   FOLATE >20.0 07/03/2018   No results found for: IRON, TIBC, FERRITIN  Imaging and Procedures obtained prior to SNF admission: DG Tibia/Fibula Left  Result Date: 05/23/2021 CLINICAL DATA:  Fall EXAM: LEFT TIBIA AND FIBULA - 2  VIEW COMPARISON:  Knee radiograph 05/23/2021 FINDINGS: Mid to distal tibia appear intact. Highly comminuted intra-articular fracture involving proximal tibia. Large lipohemarthrosis. IMPRESSION: Highly comminuted intra-articular fracture involving the proximal tibia with large lipohemarthrosis Electronically Signed   By: Donavan Foil M.D.   On: 05/23/2021 23:43   CT Head Wo Contrast  Result Date: 05/24/2021 CLINICAL DATA:  Fall while dancing, low platelets, EXAM: CT  HEAD WITHOUT CONTRAST TECHNIQUE: Contiguous axial images were obtained from the base of the skull through the vertex without intravenous contrast. COMPARISON:  None. FINDINGS: Brain: No evidence of acute infarction, hemorrhage, hydrocephalus, extra-axial collection. Question a rounded density along the left cerebellar pontine angle, could reflect a small meningioma. No other visible or suspected mass. No midline shift. Basal cisterns are patent. Patchy areas of white matter hypoattenuation are most compatible with chronic microvascular angiopathy. Symmetric prominence of the ventricles, cisterns and sulci compatible with parenchymal volume loss. Partially empty sella. Vascular: Atherosclerotic calcification of the carotid siphons. No hyperdense vessel. Skull: No calvarial fracture or suspicious osseous lesion. No scalp swelling or hematoma. Sinuses/Orbits: Paranasal sinuses and mastoid air cells are predominantly clear. Included orbital structures are unremarkable. Other: None IMPRESSION: No significant scalp swelling or calvarial fracture. No acute intracranial hemorrhage or other traumatic findings. Questionable ovoid mass along the left cerebellopontine angle measuring 1.4 x 0.6 x 1 cm in size, possible meningioma. Outpatient MRI with and without contrast could be confirmatory. Partially empty sella. Electronically Signed   By: Lovena Le M.D.   On: 05/24/2021 05:00   CT Knee Left Wo Contrast  Result Date: 05/24/2021 CLINICAL DATA:  Tibial plateau fracture EXAM: CT OF THE LEFT KNEE WITHOUT CONTRAST TECHNIQUE: Multidetector CT imaging of the left knee was performed according to the standard protocol. Multiplanar CT image reconstructions were also generated. COMPARISON:  Plain films 05/23/2021 FINDINGS: Complex, comminuted fracture noted through the proximal tibia involving the tibial spines chest and extending into both tibial plateaus. There is a large medial fragment which is displaced medially. The femur  is also displaced medially relative to the lateral tibial plateau. The fracture extends distally into the tibial shaft. Lipohemarthrosis noted. IMPRESSION: Complex comminuted proximal tibial fracture involving the tibial spines, both tibial plateaus and extending into the tibial shaft. Medial displacement of the medial tibial plateau and femur. Electronically Signed   By: Rolm Baptise M.D.   On: 05/24/2021 02:19   DG Knee Left Port  Result Date: 05/23/2021 CLINICAL DATA:  Fall, deformity, swelling EXAM: PORTABLE LEFT KNEE - 1-2 VIEW COMPARISON:  None. FINDINGS: There is a proximal tibial fracture extending into the knee joint at the tibial spines. This extends into the metaphysis with a linear extension into the proximal diaphysis. Significant displacement of the medial tibial fragment. Joint effusion with lipohemarthrosis. IMPRESSION: Displaced proximal tibial fracture extending from the tibial spines distally into the metadiaphysis. Electronically Signed   By: Rolm Baptise M.D.   On: 05/23/2021 22:31   DG Hip Unilat With Pelvis 2-3 Views Left  Result Date: 05/23/2021 CLINICAL DATA:  Fall with hip pain EXAM: DG HIP (WITH OR WITHOUT PELVIS) 2-3V LEFT COMPARISON:  None. FINDINGS: SI joints are non widened. Pubic symphysis and rami appear intact. No fracture or malalignment. IMPRESSION: No acute osseous abnormality Electronically Signed   By: Donavan Foil M.D.   On: 05/23/2021 23:42    Assessment/Plan Closed fracture of left tibial plateau, sequela Pain Control NWB for now. OK for ROM passive and Active Knee immobilizer when up  Will start  working with therapy for Upper body strength On Lovenox If Swelling gets any worse consider Venous Dopplers Hypokalemia Poassium 20 meq for 3 days BMP Repeat in 1 week Other hyperlipidemia On statin Essential hypertension On Norvasc  Vitamin D deficiency On Supplement   Family/ staff Communication:   Labs/tests ordered: BMP in 1 week

## 2021-05-30 DIAGNOSIS — R278 Other lack of coordination: Secondary | ICD-10-CM | POA: Diagnosis not present

## 2021-05-30 DIAGNOSIS — S82142S Displaced bicondylar fracture of left tibia, sequela: Secondary | ICD-10-CM | POA: Diagnosis not present

## 2021-05-30 DIAGNOSIS — R2689 Other abnormalities of gait and mobility: Secondary | ICD-10-CM | POA: Diagnosis not present

## 2021-05-30 DIAGNOSIS — M62562 Muscle wasting and atrophy, not elsewhere classified, left lower leg: Secondary | ICD-10-CM | POA: Diagnosis not present

## 2021-05-30 DIAGNOSIS — M25562 Pain in left knee: Secondary | ICD-10-CM | POA: Diagnosis not present

## 2021-05-30 DIAGNOSIS — Z9181 History of falling: Secondary | ICD-10-CM | POA: Diagnosis not present

## 2021-05-30 DIAGNOSIS — M6389 Disorders of muscle in diseases classified elsewhere, multiple sites: Secondary | ICD-10-CM | POA: Diagnosis not present

## 2021-05-30 DIAGNOSIS — R2681 Unsteadiness on feet: Secondary | ICD-10-CM | POA: Diagnosis not present

## 2021-05-31 ENCOUNTER — Non-Acute Institutional Stay (SKILLED_NURSING_FACILITY): Payer: Medicare PPO | Admitting: Orthopedic Surgery

## 2021-05-31 ENCOUNTER — Ambulatory Visit: Payer: Medicare PPO

## 2021-05-31 DIAGNOSIS — S82142S Displaced bicondylar fracture of left tibia, sequela: Secondary | ICD-10-CM | POA: Diagnosis not present

## 2021-05-31 DIAGNOSIS — L03116 Cellulitis of left lower limb: Secondary | ICD-10-CM | POA: Diagnosis not present

## 2021-06-01 DIAGNOSIS — S82142S Displaced bicondylar fracture of left tibia, sequela: Secondary | ICD-10-CM | POA: Diagnosis not present

## 2021-06-01 DIAGNOSIS — Z9181 History of falling: Secondary | ICD-10-CM | POA: Diagnosis not present

## 2021-06-01 DIAGNOSIS — M62562 Muscle wasting and atrophy, not elsewhere classified, left lower leg: Secondary | ICD-10-CM | POA: Diagnosis not present

## 2021-06-01 DIAGNOSIS — R2689 Other abnormalities of gait and mobility: Secondary | ICD-10-CM | POA: Diagnosis not present

## 2021-06-01 DIAGNOSIS — M25562 Pain in left knee: Secondary | ICD-10-CM | POA: Diagnosis not present

## 2021-06-01 DIAGNOSIS — R2681 Unsteadiness on feet: Secondary | ICD-10-CM | POA: Diagnosis not present

## 2021-06-02 ENCOUNTER — Encounter: Payer: Self-pay | Admitting: Orthopedic Surgery

## 2021-06-02 DIAGNOSIS — Z9181 History of falling: Secondary | ICD-10-CM | POA: Diagnosis not present

## 2021-06-02 DIAGNOSIS — R2689 Other abnormalities of gait and mobility: Secondary | ICD-10-CM | POA: Diagnosis not present

## 2021-06-02 DIAGNOSIS — R2681 Unsteadiness on feet: Secondary | ICD-10-CM | POA: Diagnosis not present

## 2021-06-02 DIAGNOSIS — M62562 Muscle wasting and atrophy, not elsewhere classified, left lower leg: Secondary | ICD-10-CM | POA: Diagnosis not present

## 2021-06-02 DIAGNOSIS — S82142S Displaced bicondylar fracture of left tibia, sequela: Secondary | ICD-10-CM | POA: Diagnosis not present

## 2021-06-02 DIAGNOSIS — M25562 Pain in left knee: Secondary | ICD-10-CM | POA: Diagnosis not present

## 2021-06-02 NOTE — Progress Notes (Signed)
Location:  [facility] Room Number: [place] of Service:  SNF [phone]) Provider:  Windell Moulding, AGNP-C  Leanna Battles, MD  Patient Care Team: Leanna Battles, MD as PCP - General (Internal Medicine)  Extended Emergency Contact Information Primary Emergency Contact: Page,Katie Address: [address]          [place],  [address] [address] Phone: [phone] Relation: Daughter Secondary Emergency Contact: [place] Mobile Phone: [phone] Relation: Friend  Code Status:  Full code Goals of care: Advanced Directive information Advanced Directives 05/29/2021  Does Patient Have a Medical Advance Directive? Yes  Type of Paramedic of [place];Living will  Does patient want to make changes to medical advance directive? No - Patient declined  Copy of [place] in Chart? No - copy requested     Chief Complaint  Patient presents with   Acute Visit    HPI:  Pt is a 65 y.o. female seen today for acute visit due to increased left knee swelling and erythema.   She was recently hospitalized for left proximal tibial fracture 06/14-06/19 due to fall. She was transferred to [facility] rehab after hospitalization for additional PT/OT. Today, nursing reports increased swelling and erythema over left patella. She denies increased pain, pain rated 3/10 with hydrocodone 5/325 mg. Remains NWB, knee immobilizer at all times. She has also been icing left leg a few times daily. Afebrile. She denies chest pain, sob or calf pain.   Picture taken of left leg and sent to Dr. Doreatha Martin.   Nurse does not report any other concerns.    Past Medical History:  Diagnosis Date   Allergy    Atrophic vaginitis    Birt-Hogg-Dube syndrome    lung cysts    Birt-Hogg-Dube syndrome    Chest pain    Colon polyps    adenomatous   Constipation    Depression    Diverticulosis    Dry skin    Fatty  liver    Floaters    Gall bladder disease    Glaucoma    Heart murmur    Herpes simplex    Hyperlipidemia    Hypertension    Joint pain    Lichen sclerosus    Pancreatitis    Pancreatitis    Pneumothorax, right    spontaneous   Shortness of breath    Shortness of breath on exertion    Swelling of both lower extremities    Trouble in sleeping    Past Surgical History:  Procedure Laterality Date   ABDOMINAL HYSTERECTOMY     BREAST BIOPSY  03/29/2006   BREAST BIOPSY Bilateral 06/14/1998   BREAST EXCISIONAL BIOPSY Bilateral 1999   BREAST LUMPECTOMY  1973,1992,2001   x5 times total , both breasts   CHOLECYSTECTOMY     COLONOSCOPY     2016   epidermoid cyst Right 02/2020   below Right breast   EXTERNAL FIXATION LEG Left 05/25/2021   Procedure: OPEN REDUCTION INTERNAL FIXATION  TIBIAL PLATEAU;  Surgeon: Shona Needles, MD;  Location: [facility];  Service: Orthopedics;  Laterality: Left;   hysterctomy     abdominal   POLYPECTOMY     right ovary removed     UPPER GASTROINTESTINAL ENDOSCOPY      Allergies  Allergen Reactions   No Known Allergies     Outpatient Encounter Medications as of 05/31/2021  Medication Sig   amLODipine (NORVASC) 2.5 MG tablet Take 1 tablet by mouth  Daily.    enoxaparin (LOVENOX) 40 MG/0.4ML injection Inject 0.4 mLs (40 mg total) into the skin daily for 30 doses. For 30 days post op for DVT prophylaxis   HYDROcodone-acetaminophen (NORCO) 5-325 MG tablet Take 1-2 tablets by mouth every 6 (six) hours as needed for moderate pain. MAXIMUM TOTAL ACETAMINOPHEN DOSE IS 4000 MG PER DAY   pravastatin (PRAVACHOL) 20 MG tablet Take 20 mg by mouth daily.    vitamin C (ASCORBIC ACID) 250 MG tablet Take 250 mg by mouth daily.   Vitamin D, Ergocalciferol, (DRISDOL) 1.25 MG (50000 UNIT) CAPS capsule Take 1 capsule (50,000 Units total) by mouth every 7 (seven) days.   No facility-administered encounter medications on file as of 05/31/2021.    Review of Systems   Constitutional:  Negative for activity change, appetite change, chills, fatigue and fever.  Respiratory:  Negative for cough, shortness of breath and wheezing.   Cardiovascular:  Positive for leg swelling. Negative for chest pain.  Musculoskeletal:  Positive for gait problem.       Left patella swelling and erythema  Psychiatric/Behavioral:  Negative for dysphoric mood. The patient is not nervous/anxious.    Immunization History  Administered Date(s) Administered   Hepatitis A 10/02/2005, 05/05/2006   Hepatitis A, Adult 10/02/2005, 05/05/2006   Hepatitis B, adult 09/10/2018, 10/13/2018, 03/14/2019   Hepatitis B, ped/adol 09/10/2018, 10/13/2018, 03/14/2019   Influenza Inj Mdck Quad With Preservative 07/23/2018, 08/12/2019   Influenza, High Dose Seasonal PF 08/12/2019   Influenza,inj,Quad PF,6+ Mos 07/29/2017, 07/23/2018   Influenza,inj,quad, With Preservative 08/12/2019   Influenza-Unspecified 09/09/2014, 07/10/2016, 07/29/2017, 07/23/2018, 08/08/2019   Pneumococcal Conjugate-13 11/12/2014, 11/28/2015, 09/25/2019   Pneumococcal Polysaccharide-23 11/12/2014   Pneumococcal-Unspecified 11/12/2014, 09/25/2019   Tdap 12/10/2013   Zoster Recombinat (Shingrix) 02/19/2018   Zoster, Unspecified 02/18/2018   Pertinent  Health Maintenance Due  Topic Date Due   DEXA SCAN  Never done   PNA vac Low Risk Adult (2 of 2 - PPSV23) 09/24/2020   INFLUENZA VACCINE  07/10/2021   MAMMOGRAM  05/23/2022   COLONOSCOPY (Pts 45-73yrs Insurance coverage will need to be confirmed)  07/06/2027   No flowsheet data found. Functional Status Survey:    Vitals:   05/31/21 1633  BP: (!) 153/81  Pulse: 64  Resp: 16  Temp: 98.5 F (36.9 C)  SpO2: 98%   There is no height or weight on file to calculate BMI. Physical Exam Vitals reviewed.  Constitutional:      General: She is not in acute distress. HENT:     Head: Normocephalic.  Cardiovascular:     Rate and Rhythm: Normal rate and regular rhythm.      Pulses: Normal pulses.     Heart sounds: Normal heart sounds. No murmur heard. Pulmonary:     Effort: Pulmonary effort is normal. No respiratory distress.     Breath sounds: Normal breath sounds. No wheezing.  Musculoskeletal:        General: Swelling present.     Right lower leg: No edema.     Left lower leg: Edema present.     Comments: Left leg surgical incision to anterior shin CDI, scabbing present, no drainage. Mild swelling over anterior shin. Left patella with mild swelling and erythema over patella, warm to touch. Patient tolerated exam well. Left dorsal pedis 1+.   Skin:    General: Skin is warm and dry.     Capillary Refill: Capillary refill takes less than 2 seconds.  Neurological:     General: No focal  deficit present.     Mental Status: She is alert and oriented to person, place, and time.  Psychiatric:        Mood and Affect: Mood normal.        Behavior: Behavior normal.    Labs reviewed: Recent Labs    05/26/21 0222 05/27/21 0739 05/28/21 0135  NA 139 138 139  K 3.4* 3.2* 3.4*  CL 102 102 103  CO2 27 30 30   GLUCOSE 162* 107* 119*  BUN 9 7* 10  CREATININE 0.78 0.76 0.80  CALCIUM 8.9 8.3* 8.6*   Recent Labs    05/24/21 0334  AST 22  ALT 22  ALKPHOS 78  BILITOT 0.5  PROT 6.6  ALBUMIN 3.8   Recent Labs    05/24/21 0334 05/25/21 0324 05/25/21 1803 05/26/21 0222 05/27/21 0739  WBC 9.2   < > 9.3 9.5 8.2  NEUTROABS 7.2  --   --   --   --   HGB 13.4   < > 13.5 12.5 11.4*  HCT 38.4   < > 38.0 35.0* 32.6*  MCV 86.9   < > 88.2 86.4 88.3  PLT 160   < > 148* 149* 147*   < > = values in this interval not displayed.   Lab Results  Component Value Date   TSH 1.470 07/03/2018   Lab Results  Component Value Date   HGBA1C 5.1 08/24/2020   Lab Results  Component Value Date   CHOL 175 11/03/2019   HDL 58 11/03/2019   LDLCALC 96 11/03/2019   TRIG 120 11/03/2019    Significant Diagnostic Results in last 30 days:  DG Tibia/Fibula Left  Result  Date: 05/23/2021 CLINICAL DATA:  Fall EXAM: LEFT TIBIA AND FIBULA - 2 VIEW COMPARISON:  Knee radiograph 05/23/2021 FINDINGS: Mid to distal tibia appear intact. Highly comminuted intra-articular fracture involving proximal tibia. Large lipohemarthrosis. IMPRESSION: Highly comminuted intra-articular fracture involving the proximal tibia with large lipohemarthrosis Electronically Signed   By: Donavan Foil M.D.   On: 05/23/2021 23:43   CT Head Wo Contrast  Result Date: 05/24/2021 CLINICAL DATA:  Fall while dancing, low platelets, EXAM: CT HEAD WITHOUT CONTRAST TECHNIQUE: Contiguous axial images were obtained from the base of the skull through the vertex without intravenous contrast. COMPARISON:  None. FINDINGS: Brain: No evidence of acute infarction, hemorrhage, hydrocephalus, extra-axial collection. Question a rounded density along the left cerebellar pontine angle, could reflect a small meningioma. No other visible or suspected mass. No midline shift. Basal cisterns are patent. Patchy areas of white matter hypoattenuation are most compatible with chronic microvascular angiopathy. Symmetric prominence of the ventricles, cisterns and sulci compatible with parenchymal volume loss. Partially empty sella. Vascular: Atherosclerotic calcification of the carotid siphons. No hyperdense vessel. Skull: No calvarial fracture or suspicious osseous lesion. No scalp swelling or hematoma. Sinuses/Orbits: Paranasal sinuses and mastoid air cells are predominantly clear. Included orbital structures are unremarkable. Other: None IMPRESSION: No significant scalp swelling or calvarial fracture. No acute intracranial hemorrhage or other traumatic findings. Questionable ovoid mass along the left cerebellopontine angle measuring 1.4 x 0.6 x 1 cm in size, possible meningioma. Outpatient MRI with and without contrast could be confirmatory. Partially empty sella. Electronically Signed   By: Lovena Le M.D.   On: 05/24/2021 05:00   CT  Knee Left Wo Contrast  Result Date: 05/24/2021 CLINICAL DATA:  Tibial plateau fracture EXAM: CT OF THE LEFT KNEE WITHOUT CONTRAST TECHNIQUE: Multidetector CT imaging of the left knee was performed according  to the standard protocol. Multiplanar CT image reconstructions were also generated. COMPARISON:  Plain films 05/23/2021 FINDINGS: Complex, comminuted fracture noted through the proximal tibia involving the tibial spines chest and extending into both tibial plateaus. There is a large medial fragment which is displaced medially. The femur is also displaced medially relative to the lateral tibial plateau. The fracture extends distally into the tibial shaft. Lipohemarthrosis noted. IMPRESSION: Complex comminuted proximal tibial fracture involving the tibial spines, both tibial plateaus and extending into the tibial shaft. Medial displacement of the medial tibial plateau and femur. Electronically Signed   By: Rolm Baptise M.D.   On: 05/24/2021 02:19   MR ABDOMEN WWO CONTRAST  Result Date: 05/07/2021 CLINICAL DATA:  Renal cyst, Birt-Hogg-Dube syndrome EXAM: MRI ABDOMEN WITHOUT AND WITH CONTRAST TECHNIQUE: Multiplanar multisequence MR imaging of the abdomen was performed both before and after the administration of intravenous contrast. CONTRAST:  80mL MULTIHANCE GADOBENATE DIMEGLUMINE 529 MG/ML IV SOLN COMPARISON:  09/12/2019 FINDINGS: Lower chest: Lung bases are clear. Hepatobiliary: Liver is within normal limits. No morphologic findings of cirrhosis. No hepatic steatosis. Status post cholecystectomy. No intrahepatic or extrahepatic ductal dilatation. Pancreas:  Within normal limits. Spleen:  Within normal limits. Adrenals/Urinary Tract:  Adrenal glands are within normal limits. Scattered tiny cortical cysts in the kidneys bilaterally, measuring up to 7 mm in the anterior right upper kidney (series 8/image 22), benign (Bosniak I). No suspicious/enhancing renal lesions. No hydronephrosis. Stomach/Bowel: Stomach is  within normal limits. Visualized bowel is unremarkable. Vascular/Lymphatic:  No evidence of abdominal aortic aneurysm. No suspicious abdominal lymphadenopathy. Other:  No abdominal ascites. Musculoskeletal: No focal osseous lesions. IMPRESSION: Small bilateral renal cysts measuring up to 7 mm in the anterior right upper kidney, benign (Bosniak I). No enhancing renal lesions. Status post cholecystectomy. Electronically Signed   By: Julian Hy M.D.   On: 05/07/2021 10:15   DG Knee Complete 4 Views Left  Result Date: 05/25/2021 CLINICAL DATA:  ORIF left tibial plateau. EXAM: DG C-ARM 1-60 MIN; LEFT KNEE - COMPLETE 4+ VIEW FLUOROSCOPY TIME:  Fluoroscopy Time:  2 minutes and 15 seconds. Radiation Exposure Index (if provided by the fluoroscopic device): 4.32 mGy. Number of Acquired Spot Images: 10 COMPARISON:  May 23, 2021. FINDINGS: Thin C-arm fluoroscopic images were obtained intraoperatively and submitted for post operative interpretation. These images demonstrate plate and screw fixation a comminuted proximal tibial fracture. Improved, near anatomic alignment. No unexpected findings. Expected overlying soft tissue swelling and gas. Please see the performing provider's procedural report for further detail. IMPRESSION: Intraoperative fluoroscopy, as detailed above. Electronically Signed   By: Margaretha Sheffield MD   On: 05/25/2021 15:38   DG Knee Left Port  Result Date: 05/25/2021 CLINICAL DATA:  Postop EXAM: PORTABLE LEFT KNEE - 1-2 VIEW COMPARISON:  05/25/2021, CT 05/24/2021, 05/23/2021 FINDINGS: Interval surgical plate and multiple screw fixation of comminuted intra-articular tibial fracture with anatomic alignment. Three additional linear metallic fragments at the articular surface of the lateral tibial plateau. Air in the soft tissues consistent with recent surgery. IMPRESSION: Interval surgical fixation of comminuted tibial plateau fractures. Electronically Signed   By: Donavan Foil M.D.   On:  05/25/2021 17:31   DG Knee Left Port  Result Date: 05/23/2021 CLINICAL DATA:  Fall, deformity, swelling EXAM: PORTABLE LEFT KNEE - 1-2 VIEW COMPARISON:  None. FINDINGS: There is a proximal tibial fracture extending into the knee joint at the tibial spines. This extends into the metaphysis with a linear extension into the proximal diaphysis. Significant displacement of  the medial tibial fragment. Joint effusion with lipohemarthrosis. IMPRESSION: Displaced proximal tibial fracture extending from the tibial spines distally into the metadiaphysis. Electronically Signed   By: Rolm Baptise M.D.   On: 05/23/2021 22:31   DG C-Arm 1-60 Min  Result Date: 05/25/2021 CLINICAL DATA:  ORIF left tibial plateau. EXAM: DG C-ARM 1-60 MIN; LEFT KNEE - COMPLETE 4+ VIEW FLUOROSCOPY TIME:  Fluoroscopy Time:  2 minutes and 15 seconds. Radiation Exposure Index (if provided by the fluoroscopic device): 4.32 mGy. Number of Acquired Spot Images: 10 COMPARISON:  May 23, 2021. FINDINGS: Thin C-arm fluoroscopic images were obtained intraoperatively and submitted for post operative interpretation. These images demonstrate plate and screw fixation a comminuted proximal tibial fracture. Improved, near anatomic alignment. No unexpected findings. Expected overlying soft tissue swelling and gas. Please see the performing provider's procedural report for further detail. IMPRESSION: Intraoperative fluoroscopy, as detailed above. Electronically Signed   By: Margaretha Sheffield MD   On: 05/25/2021 15:38   DG Hip Unilat With Pelvis 2-3 Views Left  Result Date: 05/23/2021 CLINICAL DATA:  Fall with hip pain EXAM: DG HIP (WITH OR WITHOUT PELVIS) 2-3V LEFT COMPARISON:  None. FINDINGS: SI joints are non widened. Pubic symphysis and rami appear intact. No fracture or malalignment. IMPRESSION: No acute osseous abnormality Electronically Signed   By: Donavan Foil M.D.   On: 05/23/2021 23:42    Assessment/Plan 1. Cellulitis of left lower extremity -  afebrile, slight erythema, warmth and swelling -doxycycline 100 mg po bid x 5 days  2. Closed fracture of left tibial plateau, sequela - POD#6 - remains NWB, keep knee immobilizer on at all times  - pain tolerated with hydrocodone 5/32q5 mg 1-2 tablets q 6 hrs prn - recommend polar ice machine - may try doubling ice packs and ice knee for atleast 30 minutes qid - LBM 05/29/2021 - cont PT/OT    Family/ staff Communication: plan discussed with patient and nurse  Labs/tests ordered:  none

## 2021-06-05 ENCOUNTER — Other Ambulatory Visit: Payer: Self-pay | Admitting: Adult Health

## 2021-06-05 DIAGNOSIS — Z9181 History of falling: Secondary | ICD-10-CM | POA: Diagnosis not present

## 2021-06-05 DIAGNOSIS — R278 Other lack of coordination: Secondary | ICD-10-CM | POA: Diagnosis not present

## 2021-06-05 DIAGNOSIS — M25562 Pain in left knee: Secondary | ICD-10-CM | POA: Diagnosis not present

## 2021-06-05 DIAGNOSIS — S82142S Displaced bicondylar fracture of left tibia, sequela: Secondary | ICD-10-CM | POA: Diagnosis not present

## 2021-06-05 DIAGNOSIS — R2689 Other abnormalities of gait and mobility: Secondary | ICD-10-CM | POA: Diagnosis not present

## 2021-06-05 DIAGNOSIS — M6389 Disorders of muscle in diseases classified elsewhere, multiple sites: Secondary | ICD-10-CM | POA: Diagnosis not present

## 2021-06-05 DIAGNOSIS — M62562 Muscle wasting and atrophy, not elsewhere classified, left lower leg: Secondary | ICD-10-CM | POA: Diagnosis not present

## 2021-06-05 DIAGNOSIS — R7989 Other specified abnormal findings of blood chemistry: Secondary | ICD-10-CM | POA: Diagnosis not present

## 2021-06-05 DIAGNOSIS — R2681 Unsteadiness on feet: Secondary | ICD-10-CM | POA: Diagnosis not present

## 2021-06-05 LAB — CBC AND DIFFERENTIAL
HCT: 37 (ref 36–46)
Hemoglobin: 12.8 (ref 12.0–16.0)
Platelets: 283 (ref 150–399)
WBC: 5

## 2021-06-05 LAB — BASIC METABOLIC PANEL
BUN: 13 (ref 4–21)
CO2: 24 — AB (ref 13–22)
Chloride: 101 (ref 99–108)
Creatinine: 0.8 (ref 0.5–1.1)
Glucose: 190
Potassium: 3.6 (ref 3.4–5.3)
Sodium: 140 (ref 137–147)

## 2021-06-05 LAB — CBC: RBC: 4.2 (ref 3.87–5.11)

## 2021-06-05 LAB — COMPREHENSIVE METABOLIC PANEL: Calcium: 9.5 (ref 8.7–10.7)

## 2021-06-05 MED ORDER — METHOCARBAMOL 500 MG PO TABS: 500.0000 mg | ORAL_TABLET | Freq: Three times a day (TID) | ORAL | 0 refills | Status: DC | PRN

## 2021-06-05 MED ORDER — HYDROCODONE-ACETAMINOPHEN 5-325 MG PO TABS: 1.0000 | ORAL_TABLET | Freq: Four times a day (QID) | ORAL | 0 refills | Status: DC | PRN

## 2021-06-06 DIAGNOSIS — R278 Other lack of coordination: Secondary | ICD-10-CM | POA: Diagnosis not present

## 2021-06-06 DIAGNOSIS — M6389 Disorders of muscle in diseases classified elsewhere, multiple sites: Secondary | ICD-10-CM | POA: Diagnosis not present

## 2021-06-06 DIAGNOSIS — M62562 Muscle wasting and atrophy, not elsewhere classified, left lower leg: Secondary | ICD-10-CM | POA: Diagnosis not present

## 2021-06-06 DIAGNOSIS — Z9181 History of falling: Secondary | ICD-10-CM | POA: Diagnosis not present

## 2021-06-06 DIAGNOSIS — R2681 Unsteadiness on feet: Secondary | ICD-10-CM | POA: Diagnosis not present

## 2021-06-06 DIAGNOSIS — M25562 Pain in left knee: Secondary | ICD-10-CM | POA: Diagnosis not present

## 2021-06-06 DIAGNOSIS — R2689 Other abnormalities of gait and mobility: Secondary | ICD-10-CM | POA: Diagnosis not present

## 2021-06-06 DIAGNOSIS — S82142S Displaced bicondylar fracture of left tibia, sequela: Secondary | ICD-10-CM | POA: Diagnosis not present

## 2021-06-07 DIAGNOSIS — M6389 Disorders of muscle in diseases classified elsewhere, multiple sites: Secondary | ICD-10-CM | POA: Diagnosis not present

## 2021-06-07 DIAGNOSIS — M62562 Muscle wasting and atrophy, not elsewhere classified, left lower leg: Secondary | ICD-10-CM | POA: Diagnosis not present

## 2021-06-07 DIAGNOSIS — M25562 Pain in left knee: Secondary | ICD-10-CM | POA: Diagnosis not present

## 2021-06-07 DIAGNOSIS — Z9181 History of falling: Secondary | ICD-10-CM | POA: Diagnosis not present

## 2021-06-07 DIAGNOSIS — S82142S Displaced bicondylar fracture of left tibia, sequela: Secondary | ICD-10-CM | POA: Diagnosis not present

## 2021-06-07 DIAGNOSIS — R2689 Other abnormalities of gait and mobility: Secondary | ICD-10-CM | POA: Diagnosis not present

## 2021-06-07 DIAGNOSIS — R2681 Unsteadiness on feet: Secondary | ICD-10-CM | POA: Diagnosis not present

## 2021-06-07 DIAGNOSIS — R278 Other lack of coordination: Secondary | ICD-10-CM | POA: Diagnosis not present

## 2021-06-08 ENCOUNTER — Other Ambulatory Visit: Payer: Self-pay

## 2021-06-08 ENCOUNTER — Ambulatory Visit
Admission: RE | Admit: 2021-06-08 | Discharge: 2021-06-08 | Disposition: A | Discharge status: Home / Self Care | Payer: Medicare PPO | Source: Ambulatory Visit | Attending: Internal Medicine | Admitting: Internal Medicine

## 2021-06-08 DIAGNOSIS — Z1231 Encounter for screening mammogram for malignant neoplasm of breast: Secondary | ICD-10-CM | POA: Diagnosis not present

## 2021-06-08 DIAGNOSIS — Z9181 History of falling: Secondary | ICD-10-CM | POA: Diagnosis not present

## 2021-06-08 DIAGNOSIS — M62562 Muscle wasting and atrophy, not elsewhere classified, left lower leg: Secondary | ICD-10-CM | POA: Diagnosis not present

## 2021-06-08 DIAGNOSIS — R278 Other lack of coordination: Secondary | ICD-10-CM | POA: Diagnosis not present

## 2021-06-08 DIAGNOSIS — S82142S Displaced bicondylar fracture of left tibia, sequela: Secondary | ICD-10-CM | POA: Diagnosis not present

## 2021-06-08 DIAGNOSIS — M6389 Disorders of muscle in diseases classified elsewhere, multiple sites: Secondary | ICD-10-CM | POA: Diagnosis not present

## 2021-06-08 DIAGNOSIS — R2681 Unsteadiness on feet: Secondary | ICD-10-CM | POA: Diagnosis not present

## 2021-06-08 DIAGNOSIS — M25562 Pain in left knee: Secondary | ICD-10-CM | POA: Diagnosis not present

## 2021-06-08 DIAGNOSIS — R2689 Other abnormalities of gait and mobility: Secondary | ICD-10-CM | POA: Diagnosis not present

## 2021-06-09 ENCOUNTER — Encounter: Payer: Self-pay | Admitting: Adult Health

## 2021-06-09 ENCOUNTER — Non-Acute Institutional Stay (SKILLED_NURSING_FACILITY): Payer: Medicare PPO | Admitting: Adult Health

## 2021-06-09 DIAGNOSIS — M25562 Pain in left knee: Secondary | ICD-10-CM

## 2021-06-09 DIAGNOSIS — S82142D Displaced bicondylar fracture of left tibia, subsequent encounter for closed fracture with routine healing: Secondary | ICD-10-CM

## 2021-06-09 NOTE — Progress Notes (Signed)
Location:  Occupational psychologist of Service:  SNF (31) Provider:   Cindi Carbon, [place] [phone]   Leanna Battles, MD  Patient Care Team: Leanna Battles, MD as PCP - General (Internal Medicine)  Extended Emergency Contact Information Primary Emergency Contact: Page,Katie Address: [address]          [place],  [address] [place] Phone: [phone] Relation: Daughter Secondary Emergency Contact: [place] Mobile Phone: [phone] Relation: Friend  Code Status:  Full Goals of care: Advanced Directive information Advanced Directives 05/29/2021  Does Patient Have a Medical Advance Directive? Yes  Type of Paramedic of [place];Living will  Does patient want to make changes to medical advance directive? No - Patient declined  Copy of [place] in Chart? No - copy requested     Chief Complaint  Patient presents with   Acute Visit    Increased left knee pain    HPI:  Pt is a 65 y.o. female seen today for an acute visit for left knee pain and swelling. She is s/p ORIF of the left tibial plateau due to fracture after a mechanical fall on 6/16. She is here at [facility] receiving therapy and is non weight bearing. She went out in a [place] for a mammogram and felt that her knee was jolted several times during the ride and now she is experiencing increased pain and some mild swelling. No numbness or tingling. No increased bruising, deformity, or bleeding.   Past Medical History:  Diagnosis Date   Allergy    Atrophic vaginitis    Birt-Hogg-Dube syndrome    lung cysts    Birt-Hogg-Dube syndrome    Chest pain    Colon polyps    adenomatous   Constipation    Depression    Diverticulosis    Dry skin    Fatty liver    Floaters    Gall bladder disease    Glaucoma    Heart murmur    Herpes simplex    Hyperlipidemia    Hypertension    Joint  pain    Lichen sclerosus    Pancreatitis    Pancreatitis    Pneumothorax, right    spontaneous   Shortness of breath    Shortness of breath on exertion    Swelling of both lower extremities    Trouble in sleeping    Past Surgical History:  Procedure Laterality Date   ABDOMINAL HYSTERECTOMY     BREAST BIOPSY  03/29/2006   BREAST BIOPSY Bilateral 06/14/1998   BREAST EXCISIONAL BIOPSY Bilateral 1999   BREAST LUMPECTOMY  1973,1992,2001   x5 times total , both breasts   CHOLECYSTECTOMY     COLONOSCOPY     2016   epidermoid cyst Right 02/2020   below Right breast   EXTERNAL FIXATION LEG Left 05/25/2021   Procedure: OPEN REDUCTION INTERNAL FIXATION  TIBIAL PLATEAU;  Surgeon: Shona Needles, MD;  Location: [place];  Service: Orthopedics;  Laterality: Left;   hysterctomy     abdominal   POLYPECTOMY     right ovary removed     UPPER GASTROINTESTINAL ENDOSCOPY      Allergies  Allergen Reactions   No Known Allergies     Outpatient Encounter Medications as of 06/09/2021  Medication Sig   methocarbamol (ROBAXIN) 500 MG tablet Take 1 tablet (500 mg total) by mouth 3 (three) times daily as needed for muscle spasms.   amLODipine (  NORVASC) 2.5 MG tablet Take 1 tablet by mouth Daily.    enoxaparin (LOVENOX) 40 MG/0.4ML injection Inject 0.4 mLs (40 mg total) into the skin daily for 30 doses. For 30 days post op for DVT prophylaxis   HYDROcodone-acetaminophen (NORCO) 5-325 MG tablet Take 1-2 tablets by mouth every 6 (six) hours as needed for moderate pain. MAXIMUM TOTAL ACETAMINOPHEN DOSE IS 4000 MG PER DAY   pravastatin (PRAVACHOL) 20 MG tablet Take 20 mg by mouth daily.    vitamin C (ASCORBIC ACID) 250 MG tablet Take 250 mg by mouth daily.   Vitamin D, Ergocalciferol, (DRISDOL) 1.25 MG (50000 UNIT) CAPS capsule Take 1 capsule (50,000 Units total) by mouth every 7 (seven) days.   No facility-administered encounter medications on file as of 06/09/2021.    Review of Systems  Constitutional:   Positive for activity change. Negative for appetite change, chills, diaphoresis, fatigue, fever and unexpected weight change.  HENT:  Negative for congestion.   Respiratory:  Negative for cough, shortness of breath and wheezing.   Cardiovascular:  Negative for chest pain, palpitations and leg swelling.  Gastrointestinal:  Negative for abdominal distention, abdominal pain, constipation and diarrhea.  Genitourinary:  Negative for difficulty urinating and dysuria.  Musculoskeletal:  Positive for arthralgias, gait problem and joint swelling. Negative for back pain and myalgias.  Neurological:  Negative for dizziness, tremors, seizures, syncope, facial asymmetry, speech difficulty, weakness, light-headedness, numbness and headaches.  Psychiatric/Behavioral:  Negative for agitation, behavioral problems and confusion.    Immunization History  Administered Date(s) Administered   Hepatitis A 10/02/2005, 05/05/2006   Hepatitis A, Adult 10/02/2005, 05/05/2006   Hepatitis B, adult 09/10/2018, 10/13/2018, 03/14/2019   Hepatitis B, ped/adol 09/10/2018, 10/13/2018, 03/14/2019   Influenza Inj Mdck Quad With Preservative 07/23/2018, 08/12/2019   Influenza, High Dose Seasonal PF 08/12/2019   Influenza,inj,Quad PF,6+ Mos 07/29/2017, 07/23/2018   Influenza,inj,quad, With Preservative 08/12/2019   Influenza-Unspecified 09/09/2014, 07/10/2016, 07/29/2017, 07/23/2018, 08/08/2019   Pneumococcal Conjugate-13 11/12/2014, 11/28/2015, 09/25/2019   Pneumococcal Polysaccharide-23 11/12/2014   Pneumococcal-Unspecified 11/12/2014, 09/25/2019   Tdap 12/10/2013   Zoster Recombinat (Shingrix) 02/19/2018   Zoster, Unspecified 02/18/2018   Pertinent  Health Maintenance Due  Topic Date Due   DEXA SCAN  Never done   PNA vac Low Risk Adult (2 of 2 - PPSV23) 09/24/2020   INFLUENZA VACCINE  07/10/2021   MAMMOGRAM  05/23/2022   COLONOSCOPY (Pts 45-9yrs Insurance coverage will need to be confirmed)  07/06/2027   No  flowsheet data found. Functional Status Survey:    Vitals:   06/09/21 1112  BP: 131/84  Pulse: 70  Resp: 18  Temp: 98.2 F (36.8 C)  SpO2: 98%  Weight: 164 lb 6.4 oz (74.6 kg)   Body mass index is 29.12 kg/m. Physical Exam Constitutional:      Appearance: Normal appearance.  Musculoskeletal:        General: Swelling (left knee. no bruising.) present. No deformity.     Right lower leg: No edema.     Comments: +CMS to the LLE   Skin:    General: Skin is warm and dry.     Comments: 2 linear incision to the left knee are well approximated with no erythema or drainage.   Neurological:     Mental Status: She is alert.    Labs reviewed: Recent Labs    05/26/21 0222 05/27/21 0739 05/28/21 0135 06/05/21 0000  NA 139 138 139 140  K 3.4* 3.2* 3.4* 3.6  CL 102 102 103 101  CO2 27 30 30  24*  GLUCOSE 162* 107* 119*  --   BUN 9 7* 10 13  CREATININE 0.78 0.76 0.80 0.8  CALCIUM 8.9 8.3* 8.6* 9.5   Recent Labs    05/24/21 0334  AST 22  ALT 22  ALKPHOS 78  BILITOT 0.5  PROT 6.6  ALBUMIN 3.8   Recent Labs    05/24/21 0334 05/25/21 0324 05/25/21 1803 05/26/21 0222 05/27/21 0739 06/05/21 0000  WBC 9.2   < > 9.3 9.5 8.2 5.0  NEUTROABS 7.2  --   --   --   --   --   HGB 13.4   < > 13.5 12.5 11.4* 12.8  HCT 38.4   < > 38.0 35.0* 32.6* 37  MCV 86.9   < > 88.2 86.4 88.3  --   PLT 160   < > 148* 149* 147* 283   < > = values in this interval not displayed.   Lab Results  Component Value Date   TSH 1.470 07/03/2018   Lab Results  Component Value Date   HGBA1C 5.1 08/24/2020   Lab Results  Component Value Date   CHOL 175 11/03/2019   HDL 58 11/03/2019   LDLCALC 96 11/03/2019   TRIG 120 11/03/2019    Significant Diagnostic Results in last 30 days:  DG Tibia/Fibula Left  Result Date: 05/23/2021 CLINICAL DATA:  Fall EXAM: LEFT TIBIA AND FIBULA - 2 VIEW COMPARISON:  Knee radiograph 05/23/2021 FINDINGS: Mid to distal tibia appear intact. Highly comminuted  intra-articular fracture involving proximal tibia. Large lipohemarthrosis. IMPRESSION: Highly comminuted intra-articular fracture involving the proximal tibia with large lipohemarthrosis Electronically Signed   By: Donavan Foil M.D.   On: 05/23/2021 23:43   CT Head Wo Contrast  Result Date: 05/24/2021 CLINICAL DATA:  Fall while dancing, low platelets, EXAM: CT HEAD WITHOUT CONTRAST TECHNIQUE: Contiguous axial images were obtained from the base of the skull through the vertex without intravenous contrast. COMPARISON:  None. FINDINGS: Brain: No evidence of acute infarction, hemorrhage, hydrocephalus, extra-axial collection. Question a rounded density along the left cerebellar pontine angle, could reflect a small meningioma. No other visible or suspected mass. No midline shift. Basal cisterns are patent. Patchy areas of white matter hypoattenuation are most compatible with chronic microvascular angiopathy. Symmetric prominence of the ventricles, cisterns and sulci compatible with parenchymal volume loss. Partially empty sella. Vascular: Atherosclerotic calcification of the carotid siphons. No hyperdense vessel. Skull: No calvarial fracture or suspicious osseous lesion. No scalp swelling or hematoma. Sinuses/Orbits: Paranasal sinuses and mastoid air cells are predominantly clear. Included orbital structures are unremarkable. Other: None IMPRESSION: No significant scalp swelling or calvarial fracture. No acute intracranial hemorrhage or other traumatic findings. Questionable ovoid mass along the left cerebellopontine angle measuring 1.4 x 0.6 x 1 cm in size, possible meningioma. Outpatient MRI with and without contrast could be confirmatory. Partially empty sella. Electronically Signed   By: Lovena Le M.D.   On: 05/24/2021 05:00   CT Knee Left Wo Contrast  Result Date: 05/24/2021 CLINICAL DATA:  Tibial plateau fracture EXAM: CT OF THE LEFT KNEE WITHOUT CONTRAST TECHNIQUE: Multidetector CT imaging of the left  knee was performed according to the standard protocol. Multiplanar CT image reconstructions were also generated. COMPARISON:  Plain films 05/23/2021 FINDINGS: Complex, comminuted fracture noted through the proximal tibia involving the tibial spines chest and extending into both tibial plateaus. There is a large medial fragment which is displaced medially. The femur is also displaced medially relative to the  lateral tibial plateau. The fracture extends distally into the tibial shaft. Lipohemarthrosis noted. IMPRESSION: Complex comminuted proximal tibial fracture involving the tibial spines, both tibial plateaus and extending into the tibial shaft. Medial displacement of the medial tibial plateau and femur. Electronically Signed   By: Rolm Baptise M.D.   On: 05/24/2021 02:19   DG Knee Complete 4 Views Left  Result Date: 05/25/2021 CLINICAL DATA:  ORIF left tibial plateau. EXAM: DG C-ARM 1-60 MIN; LEFT KNEE - COMPLETE 4+ VIEW FLUOROSCOPY TIME:  Fluoroscopy Time:  2 minutes and 15 seconds. Radiation Exposure Index (if provided by the fluoroscopic device): 4.32 mGy. Number of Acquired Spot Images: 10 COMPARISON:  May 23, 2021. FINDINGS: Thin C-arm fluoroscopic images were obtained intraoperatively and submitted for post operative interpretation. These images demonstrate plate and screw fixation a comminuted proximal tibial fracture. Improved, near anatomic alignment. No unexpected findings. Expected overlying soft tissue swelling and gas. Please see the performing provider's procedural report for further detail. IMPRESSION: Intraoperative fluoroscopy, as detailed above. Electronically Signed   By: Margaretha Sheffield MD   On: 05/25/2021 15:38   DG Knee Left Port  Result Date: 05/25/2021 CLINICAL DATA:  Postop EXAM: PORTABLE LEFT KNEE - 1-2 VIEW COMPARISON:  05/25/2021, CT 05/24/2021, 05/23/2021 FINDINGS: Interval surgical plate and multiple screw fixation of comminuted intra-articular tibial fracture with anatomic  alignment. Three additional linear metallic fragments at the articular surface of the lateral tibial plateau. Air in the soft tissues consistent with recent surgery. IMPRESSION: Interval surgical fixation of comminuted tibial plateau fractures. Electronically Signed   By: Donavan Foil M.D.   On: 05/25/2021 17:31   DG Knee Left Port  Result Date: 05/23/2021 CLINICAL DATA:  Fall, deformity, swelling EXAM: PORTABLE LEFT KNEE - 1-2 VIEW COMPARISON:  None. FINDINGS: There is a proximal tibial fracture extending into the knee joint at the tibial spines. This extends into the metaphysis with a linear extension into the proximal diaphysis. Significant displacement of the medial tibial fragment. Joint effusion with lipohemarthrosis. IMPRESSION: Displaced proximal tibial fracture extending from the tibial spines distally into the metadiaphysis. Electronically Signed   By: Rolm Baptise M.D.   On: 05/23/2021 22:31   DG C-Arm 1-60 Min  Result Date: 05/25/2021 CLINICAL DATA:  ORIF left tibial plateau. EXAM: DG C-ARM 1-60 MIN; LEFT KNEE - COMPLETE 4+ VIEW FLUOROSCOPY TIME:  Fluoroscopy Time:  2 minutes and 15 seconds. Radiation Exposure Index (if provided by the fluoroscopic device): 4.32 mGy. Number of Acquired Spot Images: 10 COMPARISON:  May 23, 2021. FINDINGS: Thin C-arm fluoroscopic images were obtained intraoperatively and submitted for post operative interpretation. These images demonstrate plate and screw fixation a comminuted proximal tibial fracture. Improved, near anatomic alignment. No unexpected findings. Expected overlying soft tissue swelling and gas. Please see the performing provider's procedural report for further detail. IMPRESSION: Intraoperative fluoroscopy, as detailed above. Electronically Signed   By: Margaretha Sheffield MD   On: 05/25/2021 15:38   DG Hip Unilat With Pelvis 2-3 Views Left  Result Date: 05/23/2021 CLINICAL DATA:  Fall with hip pain EXAM: DG HIP (WITH OR WITHOUT PELVIS) 2-3V LEFT  COMPARISON:  None. FINDINGS: SI joints are non widened. Pubic symphysis and rami appear intact. No fracture or malalignment. IMPRESSION: No acute osseous abnormality Electronically Signed   By: Donavan Foil M.D.   On: 05/23/2021 23:42    Assessment/Plan 1. Acute pain of left knee Due to increased pain and concern for trauma will order 2 view left knee xray and if there  are concerns we can communicate them to ortho She has an apt on 7/5 Hold off on PT until xray returns RICE  2. Tibial plateau fracture, left, closed, with routine healing, subsequent encounter S/p ORIF     Family/ staff Communication: resident   Labs/tests ordered:  2 view left knee xray

## 2021-06-13 ENCOUNTER — Encounter: Payer: Self-pay | Admitting: Orthopedic Surgery

## 2021-06-13 ENCOUNTER — Non-Acute Institutional Stay (SKILLED_NURSING_FACILITY): Payer: Medicare PPO | Admitting: Orthopedic Surgery

## 2021-06-13 DIAGNOSIS — S82142S Displaced bicondylar fracture of left tibia, sequela: Secondary | ICD-10-CM | POA: Diagnosis not present

## 2021-06-13 DIAGNOSIS — S82142D Displaced bicondylar fracture of left tibia, subsequent encounter for closed fracture with routine healing: Secondary | ICD-10-CM | POA: Diagnosis not present

## 2021-06-13 DIAGNOSIS — Z9181 History of falling: Secondary | ICD-10-CM | POA: Diagnosis not present

## 2021-06-13 DIAGNOSIS — S82152D Displaced fracture of left tibial tuberosity, subsequent encounter for closed fracture with routine healing: Secondary | ICD-10-CM | POA: Diagnosis not present

## 2021-06-13 DIAGNOSIS — M62562 Muscle wasting and atrophy, not elsewhere classified, left lower leg: Secondary | ICD-10-CM | POA: Diagnosis not present

## 2021-06-13 DIAGNOSIS — R2681 Unsteadiness on feet: Secondary | ICD-10-CM | POA: Diagnosis not present

## 2021-06-13 DIAGNOSIS — R2689 Other abnormalities of gait and mobility: Secondary | ICD-10-CM | POA: Diagnosis not present

## 2021-06-13 DIAGNOSIS — M6389 Disorders of muscle in diseases classified elsewhere, multiple sites: Secondary | ICD-10-CM | POA: Diagnosis not present

## 2021-06-13 DIAGNOSIS — M25562 Pain in left knee: Secondary | ICD-10-CM | POA: Diagnosis not present

## 2021-06-13 DIAGNOSIS — R278 Other lack of coordination: Secondary | ICD-10-CM | POA: Diagnosis not present

## 2021-06-13 NOTE — Progress Notes (Signed)
Location:   [facility] Room Number: 154 Place of Service:  SNF ([phone]) Provider:  Windell Moulding, NP  Leanna Battles, MD  Patient Care Team: Leanna Battles, MD as PCP - General (Internal Medicine)  Extended Emergency Contact Information Primary Emergency Contact: Page,Katie Address: [address]          [place], [place] [address] Johnnette Litter of [place] Phone: [phone] Relation: Daughter Secondary Emergency Contact: [place] Mobile Phone: [phone] Relation: Friend  Code Status:  Full Code Goals of care: Advanced Directive information Advanced Directives 05/29/2021  Does Patient Have a Medical Advance Directive? Yes  Type of Paramedic of [place];Living will  Does patient want to make changes to medical advance directive? No - Patient declined  Copy of [place] in Chart? No - copy requested     Chief Complaint  Patient presents with   Acute Visit    Left shin pain    HPI:  Pt is a 65 y.o. female seen today for an acute visit due to burning pain to left lower leg.   She was recently hospitalized for left proximal tibial fracture 06/14-06/19 due to fall. She was transferred to [facility] rehab after hospitalization for additional PT/OT. Today, she reports intermittent sharp pain to left lower leg. She reports she will be sitting and all of a sudden she will feel a sharp pain. Pain rated 4/10. Episodes will last a few minutes an go away. She continues to take hydrocodone 5/325 mg q 6 hrs prn for pain. 06/22 she was given a 5 day course of doxycycline due to cellulitis. Swelling and erythema have subsided.  Remains NWB, knee immobilizer at all times. She has also been icing left leg a few times daily. Afebrile. She denies chest pain, sob or calf pain.  She saw Dr. Doreatha Martin earlier today. New orders: NWB LLE,  unrestricted ROM of knee (active and passive), f/u in 3 weeks to repeat  x-ray of left knee, may start home vitamin D supplement.   Past Medical History:  Diagnosis Date   Allergy    Atrophic vaginitis    Birt-Hogg-Dube syndrome    lung cysts    Birt-Hogg-Dube syndrome    Chest pain    Colon polyps    adenomatous   Constipation    Depression    Diverticulosis    Dry skin    Fatty liver    Floaters    Gall bladder disease    Glaucoma    Heart murmur    Herpes simplex    Hyperlipidemia    Hypertension    Joint pain    Lichen sclerosus    Pancreatitis    Pancreatitis    Pneumothorax, right    spontaneous   Shortness of breath    Shortness of breath on exertion    Swelling of both lower extremities    Trouble in sleeping    Past Surgical History:  Procedure Laterality Date   ABDOMINAL HYSTERECTOMY     BREAST BIOPSY  03/29/2006   BREAST BIOPSY Bilateral 06/14/1998   BREAST EXCISIONAL BIOPSY Bilateral 1999   BREAST LUMPECTOMY  1973,1992,2001   x5 times total , both breasts   CHOLECYSTECTOMY     COLONOSCOPY     2016   epidermoid cyst Right 02/2020   below Right breast   EXTERNAL FIXATION LEG Left 05/25/2021   Procedure: OPEN REDUCTION INTERNAL FIXATION  TIBIAL PLATEAU;  Surgeon: Shona Needles, MD;  Location: [facility]  OR;  Service: Orthopedics;  Laterality: Left;   hysterctomy     abdominal   POLYPECTOMY     right ovary removed     UPPER GASTROINTESTINAL ENDOSCOPY      Allergies  Allergen Reactions   No Known Allergies     Allergies as of 06/13/2021       Reactions   No Known Allergies         Medication List        Accurate as of June 13, 2021  3:00 PM. If you have any questions, ask your nurse or doctor.          amLODipine 2.5 MG tablet Commonly known as: NORVASC Take 1 tablet by mouth Daily.   docusate sodium 100 MG capsule Commonly known as: COLACE Take 100 mg by mouth daily.   enoxaparin 40 MG/0.4ML injection Commonly known as: LOVENOX Inject 0.4 mLs (40 mg total) into the skin daily for 30 doses. For 30 days  post op for DVT prophylaxis   HYDROcodone-acetaminophen 5-325 MG tablet Commonly known as: Norco Take 1-2 tablets by mouth every 6 (six) hours as needed for moderate pain. MAXIMUM TOTAL ACETAMINOPHEN DOSE IS 4000 MG PER DAY   methocarbamol 500 MG tablet Commonly known as: Robaxin Take 1 tablet (500 mg total) by mouth 3 (three) times daily as needed for muscle spasms.   pravastatin 20 MG tablet Commonly known as: PRAVACHOL Take 20 mg by mouth daily.   vitamin C 250 MG tablet Commonly known as: ASCORBIC ACID Take 250 mg by mouth daily.   Vitamin D (Ergocalciferol) 1.25 MG (50000 UNIT) Caps capsule Commonly known as: DRISDOL Take 1 capsule (50,000 Units total) by mouth every 7 (seven) days.        Review of Systems  Constitutional:  Negative for activity change, appetite change, fatigue and fever.  Respiratory:  Negative for cough, shortness of breath and wheezing.   Cardiovascular:  Positive for leg swelling. Negative for chest pain.  Gastrointestinal:  Negative for abdominal distention, abdominal pain, constipation, diarrhea and vomiting.  Musculoskeletal:  Positive for arthralgias, gait problem, joint swelling and myalgias.       Left lower leg pain, recent tibia fracture  Skin:        Surgical incision  Neurological:  Negative for dizziness, weakness and headaches.  Psychiatric/Behavioral:  Negative for dysphoric mood. The patient is not nervous/anxious.    Immunization History  Administered Date(s) Administered   Hepatitis A 10/02/2005, 05/05/2006   Hepatitis A, Adult 10/02/2005, 05/05/2006   Hepatitis B, adult 09/10/2018, 10/13/2018, 03/14/2019   Hepatitis B, ped/adol 09/10/2018, 10/13/2018, 03/14/2019   Influenza Inj Mdck Quad With Preservative 07/23/2018, 08/12/2019   Influenza, High Dose Seasonal PF 08/12/2019   Influenza,inj,Quad PF,6+ Mos 07/29/2017, 07/23/2018   Influenza,inj,quad, With Preservative 08/12/2019   Influenza-Unspecified 09/09/2014, 07/10/2016,  07/29/2017, 07/23/2018, 08/08/2019   Pneumococcal Conjugate-13 11/12/2014, 11/28/2015, 09/25/2019   Pneumococcal Polysaccharide-23 11/12/2014   Pneumococcal-Unspecified 11/12/2014, 09/25/2019   Tdap 12/10/2013   Zoster Recombinat (Shingrix) 02/19/2018   Zoster, Unspecified 02/18/2018   Pertinent  Health Maintenance Due  Topic Date Due   DEXA SCAN  Never done   PNA vac Low Risk Adult (2 of 2 - PPSV23) 09/24/2020   INFLUENZA VACCINE  07/10/2021   MAMMOGRAM  05/23/2022   COLONOSCOPY (Pts 45-25yrs Insurance coverage will need to be confirmed)  07/06/2027   No flowsheet data found. Functional Status Survey:    Vitals:   06/13/21 1456  BP: 119/77  Pulse: 69  Resp: 16  Temp: 98.7 F (37.1 C)  SpO2: 96%  Weight: 164 lb 6.4 oz (74.6 kg)  Height: 5\' 3"  (1.6 m)   Body mass index is 29.12 kg/m. Physical Exam Vitals reviewed.  Constitutional:      General: She is not in acute distress. HENT:     Head: Normocephalic.  Cardiovascular:     Rate and Rhythm: Normal rate and regular rhythm.     Pulses: Normal pulses.     Heart sounds: Normal heart sounds. No murmur heard. Pulmonary:     Effort: Pulmonary effort is normal. No respiratory distress.     Breath sounds: Normal breath sounds. No wheezing.  Abdominal:     General: Bowel sounds are normal. There is no distension.     Palpations: Abdomen is soft.     Tenderness: There is no abdominal tenderness.  Musculoskeletal:     Right lower leg: No edema.     Left lower leg: Edema present.     Comments:  Left knee in immobilizer. Surgical incision CDI, closed, no drainage or redness. Mild swelling over patella and left shin non-pitting.   Skin:    General: Skin is warm and dry.  Neurological:     General: No focal deficit present.     Mental Status: She is alert and oriented to person, place, and time.     Motor: Weakness present.     Gait: Gait abnormal.     Comments: wheelchair  Psychiatric:        Mood and Affect: Mood  normal.        Behavior: Behavior normal.    Labs reviewed: Recent Labs    05/26/21 0222 05/27/21 0739 05/28/21 0135 06/05/21 0000  NA 139 138 139 140  K 3.4* 3.2* 3.4* 3.6  CL 102 102 103 101  CO2 27 30 30  24*  GLUCOSE 162* 107* 119*  --   BUN 9 7* 10 13  CREATININE 0.78 0.76 0.80 0.8  CALCIUM 8.9 8.3* 8.6* 9.5   Recent Labs    05/24/21 0334  AST 22  ALT 22  ALKPHOS 78  BILITOT 0.5  PROT 6.6  ALBUMIN 3.8   Recent Labs    05/24/21 0334 05/25/21 0324 05/25/21 1803 05/26/21 0222 05/27/21 0739 06/05/21 0000  WBC 9.2   < > 9.3 9.5 8.2 5.0  NEUTROABS 7.2  --   --   --   --   --   HGB 13.4   < > 13.5 12.5 11.4* 12.8  HCT 38.4   < > 38.0 35.0* 32.6* 37  MCV 86.9   < > 88.2 86.4 88.3  --   PLT 160   < > 148* 149* 147* 283   < > = values in this interval not displayed.   Lab Results  Component Value Date   TSH 1.470 07/03/2018   Lab Results  Component Value Date   HGBA1C 5.1 08/24/2020   Lab Results  Component Value Date   CHOL 175 11/03/2019   HDL 58 11/03/2019   LDLCALC 96 11/03/2019   TRIG 120 11/03/2019    Significant Diagnostic Results in last 30 days:  DG Tibia/Fibula Left  Result Date: 05/23/2021 CLINICAL DATA:  Fall EXAM: LEFT TIBIA AND FIBULA - 2 VIEW COMPARISON:  Knee radiograph 05/23/2021 FINDINGS: Mid to distal tibia appear intact. Highly comminuted intra-articular fracture involving proximal tibia. Large lipohemarthrosis. IMPRESSION: Highly comminuted intra-articular fracture involving the proximal tibia with large lipohemarthrosis Electronically Signed   By:  Donavan Foil M.D.   On: 05/23/2021 23:43   CT Head Wo Contrast  Result Date: 05/24/2021 CLINICAL DATA:  Fall while dancing, low platelets, EXAM: CT HEAD WITHOUT CONTRAST TECHNIQUE: Contiguous axial images were obtained from the base of the skull through the vertex without intravenous contrast. COMPARISON:  None. FINDINGS: Brain: No evidence of acute infarction, hemorrhage, hydrocephalus,  extra-axial collection. Question a rounded density along the left cerebellar pontine angle, could reflect a small meningioma. No other visible or suspected mass. No midline shift. Basal cisterns are patent. Patchy areas of white matter hypoattenuation are most compatible with chronic microvascular angiopathy. Symmetric prominence of the ventricles, cisterns and sulci compatible with parenchymal volume loss. Partially empty sella. Vascular: Atherosclerotic calcification of the carotid siphons. No hyperdense vessel. Skull: No calvarial fracture or suspicious osseous lesion. No scalp swelling or hematoma. Sinuses/Orbits: Paranasal sinuses and mastoid air cells are predominantly clear. Included orbital structures are unremarkable. Other: None IMPRESSION: No significant scalp swelling or calvarial fracture. No acute intracranial hemorrhage or other traumatic findings. Questionable ovoid mass along the left cerebellopontine angle measuring 1.4 x 0.6 x 1 cm in size, possible meningioma. Outpatient MRI with and without contrast could be confirmatory. Partially empty sella. Electronically Signed   By: Lovena Le M.D.   On: 05/24/2021 05:00   CT Knee Left Wo Contrast  Result Date: 05/24/2021 CLINICAL DATA:  Tibial plateau fracture EXAM: CT OF THE LEFT KNEE WITHOUT CONTRAST TECHNIQUE: Multidetector CT imaging of the left knee was performed according to the standard protocol. Multiplanar CT image reconstructions were also generated. COMPARISON:  Plain films 05/23/2021 FINDINGS: Complex, comminuted fracture noted through the proximal tibia involving the tibial spines chest and extending into both tibial plateaus. There is a large medial fragment which is displaced medially. The femur is also displaced medially relative to the lateral tibial plateau. The fracture extends distally into the tibial shaft. Lipohemarthrosis noted. IMPRESSION: Complex comminuted proximal tibial fracture involving the tibial spines, both tibial  plateaus and extending into the tibial shaft. Medial displacement of the medial tibial plateau and femur. Electronically Signed   By: Rolm Baptise M.D.   On: 05/24/2021 02:19   DG Knee Complete 4 Views Left  Result Date: 05/25/2021 CLINICAL DATA:  ORIF left tibial plateau. EXAM: DG C-ARM 1-60 MIN; LEFT KNEE - COMPLETE 4+ VIEW FLUOROSCOPY TIME:  Fluoroscopy Time:  2 minutes and 15 seconds. Radiation Exposure Index (if provided by the fluoroscopic device): 4.32 mGy. Number of Acquired Spot Images: 10 COMPARISON:  May 23, 2021. FINDINGS: Thin C-arm fluoroscopic images were obtained intraoperatively and submitted for post operative interpretation. These images demonstrate plate and screw fixation a comminuted proximal tibial fracture. Improved, near anatomic alignment. No unexpected findings. Expected overlying soft tissue swelling and gas. Please see the performing provider's procedural report for further detail. IMPRESSION: Intraoperative fluoroscopy, as detailed above. Electronically Signed   By: Margaretha Sheffield MD   On: 05/25/2021 15:38   DG Knee Left Port  Result Date: 05/25/2021 CLINICAL DATA:  Postop EXAM: PORTABLE LEFT KNEE - 1-2 VIEW COMPARISON:  05/25/2021, CT 05/24/2021, 05/23/2021 FINDINGS: Interval surgical plate and multiple screw fixation of comminuted intra-articular tibial fracture with anatomic alignment. Three additional linear metallic fragments at the articular surface of the lateral tibial plateau. Air in the soft tissues consistent with recent surgery. IMPRESSION: Interval surgical fixation of comminuted tibial plateau fractures. Electronically Signed   By: Donavan Foil M.D.   On: 05/25/2021 17:31   DG Knee Left Port  Result  Date: 05/23/2021 CLINICAL DATA:  Fall, deformity, swelling EXAM: PORTABLE LEFT KNEE - 1-2 VIEW COMPARISON:  None. FINDINGS: There is a proximal tibial fracture extending into the knee joint at the tibial spines. This extends into the metaphysis with a linear  extension into the proximal diaphysis. Significant displacement of the medial tibial fragment. Joint effusion with lipohemarthrosis. IMPRESSION: Displaced proximal tibial fracture extending from the tibial spines distally into the metadiaphysis. Electronically Signed   By: Rolm Baptise M.D.   On: 05/23/2021 22:31   DG C-Arm 1-60 Min  Result Date: 05/25/2021 CLINICAL DATA:  ORIF left tibial plateau. EXAM: DG C-ARM 1-60 MIN; LEFT KNEE - COMPLETE 4+ VIEW FLUOROSCOPY TIME:  Fluoroscopy Time:  2 minutes and 15 seconds. Radiation Exposure Index (if provided by the fluoroscopic device): 4.32 mGy. Number of Acquired Spot Images: 10 COMPARISON:  May 23, 2021. FINDINGS: Thin C-arm fluoroscopic images were obtained intraoperatively and submitted for post operative interpretation. These images demonstrate plate and screw fixation a comminuted proximal tibial fracture. Improved, near anatomic alignment. No unexpected findings. Expected overlying soft tissue swelling and gas. Please see the performing provider's procedural report for further detail. IMPRESSION: Intraoperative fluoroscopy, as detailed above. Electronically Signed   By: Margaretha Sheffield MD   On: 05/25/2021 15:38   DG Hip Unilat With Pelvis 2-3 Views Left  Result Date: 05/23/2021 CLINICAL DATA:  Fall with hip pain EXAM: DG HIP (WITH OR WITHOUT PELVIS) 2-3V LEFT COMPARISON:  None. FINDINGS: SI joints are non widened. Pubic symphysis and rami appear intact. No fracture or malalignment. IMPRESSION: No acute osseous abnormality Electronically Signed   By: Donavan Foil M.D.   On: 05/23/2021 23:42    Assessment/Plan 1. Tibial plateau fracture, left, closed, with routine healing, subsequent encounter - surgical incision CDI, no sign of infection - suspect intermittent pain due to healing process - will extend hydrocodone 5/325 mg q 6 hrs prn x 14 days - cont to ice lower leg TID prn  - remain NWB  - f/u in 3 weeks with Dr. Doreatha Martin (  07/04/21)    Family/ staff Communication:   Labs/tests ordered:

## 2021-06-14 ENCOUNTER — Other Ambulatory Visit: Payer: Self-pay | Admitting: Orthopedic Surgery

## 2021-06-14 DIAGNOSIS — S82142D Displaced bicondylar fracture of left tibia, subsequent encounter for closed fracture with routine healing: Secondary | ICD-10-CM

## 2021-06-14 DIAGNOSIS — R278 Other lack of coordination: Secondary | ICD-10-CM | POA: Diagnosis not present

## 2021-06-14 DIAGNOSIS — M6389 Disorders of muscle in diseases classified elsewhere, multiple sites: Secondary | ICD-10-CM | POA: Diagnosis not present

## 2021-06-14 DIAGNOSIS — M62562 Muscle wasting and atrophy, not elsewhere classified, left lower leg: Secondary | ICD-10-CM | POA: Diagnosis not present

## 2021-06-14 DIAGNOSIS — R2681 Unsteadiness on feet: Secondary | ICD-10-CM | POA: Diagnosis not present

## 2021-06-14 DIAGNOSIS — R2689 Other abnormalities of gait and mobility: Secondary | ICD-10-CM | POA: Diagnosis not present

## 2021-06-14 DIAGNOSIS — Z9181 History of falling: Secondary | ICD-10-CM | POA: Diagnosis not present

## 2021-06-14 DIAGNOSIS — S82142S Displaced bicondylar fracture of left tibia, sequela: Secondary | ICD-10-CM | POA: Diagnosis not present

## 2021-06-14 DIAGNOSIS — M25562 Pain in left knee: Secondary | ICD-10-CM | POA: Diagnosis not present

## 2021-06-14 MED ORDER — HYDROCODONE-ACETAMINOPHEN 5-325 MG PO TABS: 1.0000 | ORAL_TABLET | Freq: Four times a day (QID) | ORAL | 0 refills | Status: AC | PRN

## 2021-06-15 ENCOUNTER — Other Ambulatory Visit: Payer: Self-pay | Admitting: Internal Medicine

## 2021-06-15 DIAGNOSIS — R928 Other abnormal and inconclusive findings on diagnostic imaging of breast: Secondary | ICD-10-CM

## 2021-06-16 DIAGNOSIS — R2681 Unsteadiness on feet: Secondary | ICD-10-CM | POA: Diagnosis not present

## 2021-06-16 DIAGNOSIS — M62562 Muscle wasting and atrophy, not elsewhere classified, left lower leg: Secondary | ICD-10-CM | POA: Diagnosis not present

## 2021-06-16 DIAGNOSIS — R2689 Other abnormalities of gait and mobility: Secondary | ICD-10-CM | POA: Diagnosis not present

## 2021-06-16 DIAGNOSIS — Z9181 History of falling: Secondary | ICD-10-CM | POA: Diagnosis not present

## 2021-06-16 DIAGNOSIS — M25562 Pain in left knee: Secondary | ICD-10-CM | POA: Diagnosis not present

## 2021-06-16 DIAGNOSIS — S82142S Displaced bicondylar fracture of left tibia, sequela: Secondary | ICD-10-CM | POA: Diagnosis not present

## 2021-06-19 DIAGNOSIS — M62562 Muscle wasting and atrophy, not elsewhere classified, left lower leg: Secondary | ICD-10-CM | POA: Diagnosis not present

## 2021-06-19 DIAGNOSIS — R2681 Unsteadiness on feet: Secondary | ICD-10-CM | POA: Diagnosis not present

## 2021-06-19 DIAGNOSIS — R2689 Other abnormalities of gait and mobility: Secondary | ICD-10-CM | POA: Diagnosis not present

## 2021-06-19 DIAGNOSIS — M25562 Pain in left knee: Secondary | ICD-10-CM | POA: Diagnosis not present

## 2021-06-19 DIAGNOSIS — M6389 Disorders of muscle in diseases classified elsewhere, multiple sites: Secondary | ICD-10-CM | POA: Diagnosis not present

## 2021-06-19 DIAGNOSIS — R278 Other lack of coordination: Secondary | ICD-10-CM | POA: Diagnosis not present

## 2021-06-19 DIAGNOSIS — S82142S Displaced bicondylar fracture of left tibia, sequela: Secondary | ICD-10-CM | POA: Diagnosis not present

## 2021-06-19 DIAGNOSIS — Z9181 History of falling: Secondary | ICD-10-CM | POA: Diagnosis not present

## 2021-06-20 DIAGNOSIS — M6389 Disorders of muscle in diseases classified elsewhere, multiple sites: Secondary | ICD-10-CM | POA: Diagnosis not present

## 2021-06-20 DIAGNOSIS — Z9181 History of falling: Secondary | ICD-10-CM | POA: Diagnosis not present

## 2021-06-20 DIAGNOSIS — S82142S Displaced bicondylar fracture of left tibia, sequela: Secondary | ICD-10-CM | POA: Diagnosis not present

## 2021-06-20 DIAGNOSIS — R278 Other lack of coordination: Secondary | ICD-10-CM | POA: Diagnosis not present

## 2021-06-21 DIAGNOSIS — M6389 Disorders of muscle in diseases classified elsewhere, multiple sites: Secondary | ICD-10-CM | POA: Diagnosis not present

## 2021-06-21 DIAGNOSIS — Z9181 History of falling: Secondary | ICD-10-CM | POA: Diagnosis not present

## 2021-06-21 DIAGNOSIS — R278 Other lack of coordination: Secondary | ICD-10-CM | POA: Diagnosis not present

## 2021-06-21 DIAGNOSIS — S82142S Displaced bicondylar fracture of left tibia, sequela: Secondary | ICD-10-CM | POA: Diagnosis not present

## 2021-06-21 DIAGNOSIS — M25562 Pain in left knee: Secondary | ICD-10-CM | POA: Diagnosis not present

## 2021-06-21 DIAGNOSIS — M62562 Muscle wasting and atrophy, not elsewhere classified, left lower leg: Secondary | ICD-10-CM | POA: Diagnosis not present

## 2021-06-21 DIAGNOSIS — R2681 Unsteadiness on feet: Secondary | ICD-10-CM | POA: Diagnosis not present

## 2021-06-21 DIAGNOSIS — R2689 Other abnormalities of gait and mobility: Secondary | ICD-10-CM | POA: Diagnosis not present

## 2021-06-22 DIAGNOSIS — M6389 Disorders of muscle in diseases classified elsewhere, multiple sites: Secondary | ICD-10-CM | POA: Diagnosis not present

## 2021-06-22 DIAGNOSIS — R278 Other lack of coordination: Secondary | ICD-10-CM | POA: Diagnosis not present

## 2021-06-22 DIAGNOSIS — Z9181 History of falling: Secondary | ICD-10-CM | POA: Diagnosis not present

## 2021-06-22 DIAGNOSIS — S82142S Displaced bicondylar fracture of left tibia, sequela: Secondary | ICD-10-CM | POA: Diagnosis not present

## 2021-06-23 DIAGNOSIS — M62562 Muscle wasting and atrophy, not elsewhere classified, left lower leg: Secondary | ICD-10-CM | POA: Diagnosis not present

## 2021-06-23 DIAGNOSIS — R2689 Other abnormalities of gait and mobility: Secondary | ICD-10-CM | POA: Diagnosis not present

## 2021-06-23 DIAGNOSIS — S82142S Displaced bicondylar fracture of left tibia, sequela: Secondary | ICD-10-CM | POA: Diagnosis not present

## 2021-06-23 DIAGNOSIS — M6389 Disorders of muscle in diseases classified elsewhere, multiple sites: Secondary | ICD-10-CM | POA: Diagnosis not present

## 2021-06-23 DIAGNOSIS — Z9181 History of falling: Secondary | ICD-10-CM | POA: Diagnosis not present

## 2021-06-23 DIAGNOSIS — M25562 Pain in left knee: Secondary | ICD-10-CM | POA: Diagnosis not present

## 2021-06-23 DIAGNOSIS — R2681 Unsteadiness on feet: Secondary | ICD-10-CM | POA: Diagnosis not present

## 2021-06-23 DIAGNOSIS — R278 Other lack of coordination: Secondary | ICD-10-CM | POA: Diagnosis not present

## 2021-06-26 DIAGNOSIS — R278 Other lack of coordination: Secondary | ICD-10-CM | POA: Diagnosis not present

## 2021-06-26 DIAGNOSIS — M25562 Pain in left knee: Secondary | ICD-10-CM | POA: Diagnosis not present

## 2021-06-26 DIAGNOSIS — M6389 Disorders of muscle in diseases classified elsewhere, multiple sites: Secondary | ICD-10-CM | POA: Diagnosis not present

## 2021-06-26 DIAGNOSIS — R2689 Other abnormalities of gait and mobility: Secondary | ICD-10-CM | POA: Diagnosis not present

## 2021-06-26 DIAGNOSIS — M62562 Muscle wasting and atrophy, not elsewhere classified, left lower leg: Secondary | ICD-10-CM | POA: Diagnosis not present

## 2021-06-26 DIAGNOSIS — Z9181 History of falling: Secondary | ICD-10-CM | POA: Diagnosis not present

## 2021-06-26 DIAGNOSIS — R2681 Unsteadiness on feet: Secondary | ICD-10-CM | POA: Diagnosis not present

## 2021-06-26 DIAGNOSIS — S82142S Displaced bicondylar fracture of left tibia, sequela: Secondary | ICD-10-CM | POA: Diagnosis not present

## 2021-06-27 DIAGNOSIS — Z9181 History of falling: Secondary | ICD-10-CM | POA: Diagnosis not present

## 2021-06-27 DIAGNOSIS — M6389 Disorders of muscle in diseases classified elsewhere, multiple sites: Secondary | ICD-10-CM | POA: Diagnosis not present

## 2021-06-27 DIAGNOSIS — S82142S Displaced bicondylar fracture of left tibia, sequela: Secondary | ICD-10-CM | POA: Diagnosis not present

## 2021-06-27 DIAGNOSIS — R278 Other lack of coordination: Secondary | ICD-10-CM | POA: Diagnosis not present

## 2021-06-28 DIAGNOSIS — R2689 Other abnormalities of gait and mobility: Secondary | ICD-10-CM | POA: Diagnosis not present

## 2021-06-28 DIAGNOSIS — Z9181 History of falling: Secondary | ICD-10-CM | POA: Diagnosis not present

## 2021-06-28 DIAGNOSIS — M25562 Pain in left knee: Secondary | ICD-10-CM | POA: Diagnosis not present

## 2021-06-28 DIAGNOSIS — M62562 Muscle wasting and atrophy, not elsewhere classified, left lower leg: Secondary | ICD-10-CM | POA: Diagnosis not present

## 2021-06-28 DIAGNOSIS — R2681 Unsteadiness on feet: Secondary | ICD-10-CM | POA: Diagnosis not present

## 2021-06-28 DIAGNOSIS — R278 Other lack of coordination: Secondary | ICD-10-CM | POA: Diagnosis not present

## 2021-06-28 DIAGNOSIS — S82142S Displaced bicondylar fracture of left tibia, sequela: Secondary | ICD-10-CM | POA: Diagnosis not present

## 2021-06-28 DIAGNOSIS — M6389 Disorders of muscle in diseases classified elsewhere, multiple sites: Secondary | ICD-10-CM | POA: Diagnosis not present

## 2021-06-29 DIAGNOSIS — Z9181 History of falling: Secondary | ICD-10-CM | POA: Diagnosis not present

## 2021-06-29 DIAGNOSIS — M62562 Muscle wasting and atrophy, not elsewhere classified, left lower leg: Secondary | ICD-10-CM | POA: Diagnosis not present

## 2021-06-29 DIAGNOSIS — R278 Other lack of coordination: Secondary | ICD-10-CM | POA: Diagnosis not present

## 2021-06-29 DIAGNOSIS — M25562 Pain in left knee: Secondary | ICD-10-CM | POA: Diagnosis not present

## 2021-06-29 DIAGNOSIS — R2689 Other abnormalities of gait and mobility: Secondary | ICD-10-CM | POA: Diagnosis not present

## 2021-06-29 DIAGNOSIS — R2681 Unsteadiness on feet: Secondary | ICD-10-CM | POA: Diagnosis not present

## 2021-06-29 DIAGNOSIS — S82142S Displaced bicondylar fracture of left tibia, sequela: Secondary | ICD-10-CM | POA: Diagnosis not present

## 2021-06-29 DIAGNOSIS — M6389 Disorders of muscle in diseases classified elsewhere, multiple sites: Secondary | ICD-10-CM | POA: Diagnosis not present

## 2021-06-30 DIAGNOSIS — S82142S Displaced bicondylar fracture of left tibia, sequela: Secondary | ICD-10-CM | POA: Diagnosis not present

## 2021-06-30 DIAGNOSIS — Z9181 History of falling: Secondary | ICD-10-CM | POA: Diagnosis not present

## 2021-06-30 DIAGNOSIS — M6389 Disorders of muscle in diseases classified elsewhere, multiple sites: Secondary | ICD-10-CM | POA: Diagnosis not present

## 2021-06-30 DIAGNOSIS — R278 Other lack of coordination: Secondary | ICD-10-CM | POA: Diagnosis not present

## 2021-07-03 ENCOUNTER — Other Ambulatory Visit: Payer: Self-pay | Admitting: Adult Health

## 2021-07-03 DIAGNOSIS — M62562 Muscle wasting and atrophy, not elsewhere classified, left lower leg: Secondary | ICD-10-CM | POA: Diagnosis not present

## 2021-07-03 DIAGNOSIS — M25562 Pain in left knee: Secondary | ICD-10-CM | POA: Diagnosis not present

## 2021-07-03 DIAGNOSIS — R2689 Other abnormalities of gait and mobility: Secondary | ICD-10-CM | POA: Diagnosis not present

## 2021-07-03 DIAGNOSIS — S82142S Displaced bicondylar fracture of left tibia, sequela: Secondary | ICD-10-CM | POA: Diagnosis not present

## 2021-07-03 DIAGNOSIS — Z9181 History of falling: Secondary | ICD-10-CM | POA: Diagnosis not present

## 2021-07-03 DIAGNOSIS — R2681 Unsteadiness on feet: Secondary | ICD-10-CM | POA: Diagnosis not present

## 2021-07-03 MED ORDER — HYDROCODONE-ACETAMINOPHEN 5-325 MG PO TABS: 1.0000 | ORAL_TABLET | Freq: Four times a day (QID) | ORAL | 0 refills | Status: AC | PRN

## 2021-07-04 ENCOUNTER — Non-Acute Institutional Stay (SKILLED_NURSING_FACILITY): Payer: Medicare PPO | Admitting: Orthopedic Surgery

## 2021-07-04 ENCOUNTER — Encounter: Payer: Self-pay | Admitting: Orthopedic Surgery

## 2021-07-04 DIAGNOSIS — E876 Hypokalemia: Secondary | ICD-10-CM | POA: Diagnosis not present

## 2021-07-04 DIAGNOSIS — S82142D Displaced bicondylar fracture of left tibia, subsequent encounter for closed fracture with routine healing: Secondary | ICD-10-CM | POA: Diagnosis not present

## 2021-07-04 DIAGNOSIS — E559 Vitamin D deficiency, unspecified: Secondary | ICD-10-CM | POA: Diagnosis not present

## 2021-07-04 DIAGNOSIS — S82142S Displaced bicondylar fracture of left tibia, sequela: Secondary | ICD-10-CM | POA: Diagnosis not present

## 2021-07-04 DIAGNOSIS — I10 Essential (primary) hypertension: Secondary | ICD-10-CM

## 2021-07-04 DIAGNOSIS — E7849 Other hyperlipidemia: Secondary | ICD-10-CM | POA: Diagnosis not present

## 2021-07-04 DIAGNOSIS — M6389 Disorders of muscle in diseases classified elsewhere, multiple sites: Secondary | ICD-10-CM | POA: Diagnosis not present

## 2021-07-04 DIAGNOSIS — R278 Other lack of coordination: Secondary | ICD-10-CM | POA: Diagnosis not present

## 2021-07-04 DIAGNOSIS — Z9181 History of falling: Secondary | ICD-10-CM | POA: Diagnosis not present

## 2021-07-04 DIAGNOSIS — S82152D Displaced fracture of left tibial tuberosity, subsequent encounter for closed fracture with routine healing: Secondary | ICD-10-CM | POA: Diagnosis not present

## 2021-07-04 NOTE — Progress Notes (Addendum)
Location:  [facility] Room Number: 154 Place of Service:  SNF [phone]) Provider:  Windell Moulding, Joanette Gula, MD  Patient Care Team: Leanna Battles, MD as PCP - General (Internal Medicine)  Extended Emergency Contact Information Primary Emergency Contact: Page,Katie Address: [address] [place]          [address] [place], [place] [address] Johnnette Litter of [place] Phone: [phone] Relation: Daughter Secondary Emergency Contact: [place] Mobile Phone: [phone] Relation: Friend  Code Status:  Full Goals of care: Advanced Directive information Advanced Directives 05/29/2021  Does Patient Have a Medical Advance Directive? Yes  Type of Paramedic of [place];Living will  Does patient want to make changes to medical advance directive? No - Patient declined  Copy of [place] in Chart? No - copy requested     Chief Complaint  Patient presents with   Discharge Note    HPI:  Pt is a 65 y.o. female seen today for discharge evaluation.   She was recently hospitalized for left proximal tibial fracture 06/14-06/19 due to fall. She was transferred to [facility] rehab after hospitalization for additional PT/OT. She continues to take hydrocodone 5/325 mg q 6 hrs prn for pain. She reports minimal pain today, she has not had pain medication since 07/25. Regular bowel movements with prn colace. Remains NWB, knee immobilizer at all times. She has also been icing left leg a few times daily. Afebrile. She denies chest pain, sob or calf pain.  06/22 she was given a 5 day course of doxycycline due to cellulitis.  No recent falls or injuries.   Recent blood pressures:  07/26- 140/83  07/25- 109/74, 134/80, 128/77  07/24- 133/77  Recent weights:  07/26- 167.6 lbs  06/28- 163.4 lbs  06/20- 165 lbs   She plans to f/u with Dr. Doreatha Martin this afternoon. She hopes for new weight bearing status and approval  to go home. Home health PT/OT and nurse ordered. She has hired 24 hr nursing care to be with her for the first week. I have advised her to follow up with PCP in 1-2 weeks for TOC.     Past Medical History:  Diagnosis Date   Allergy    Atrophic vaginitis    Birt-Hogg-Dube syndrome    lung cysts    Birt-Hogg-Dube syndrome    Chest pain    Colon polyps    adenomatous   Constipation    Depression    Diverticulosis    Dry skin    Fatty liver    Floaters    Gall bladder disease    Glaucoma    Heart murmur    Herpes simplex    Hyperlipidemia    Hypertension    Joint pain    Lichen sclerosus    Pancreatitis    Pancreatitis    Pneumothorax, right    spontaneous   Shortness of breath    Shortness of breath on exertion    Swelling of both lower extremities    Trouble in sleeping    Past Surgical History:  Procedure Laterality Date   ABDOMINAL HYSTERECTOMY     BREAST BIOPSY  03/29/2006   BREAST BIOPSY Bilateral 06/14/1998   BREAST EXCISIONAL BIOPSY Bilateral 1999   BREAST LUMPECTOMY  1973,1992,2001   x5 times total , both breasts   CHOLECYSTECTOMY     COLONOSCOPY     2016   epidermoid cyst Right 02/2020   below Right breast   EXTERNAL FIXATION LEG Left  05/25/2021   Procedure: OPEN REDUCTION INTERNAL FIXATION  TIBIAL PLATEAU;  Surgeon: Shona Needles, MD;  Location: [facility];  Service: Orthopedics;  Laterality: Left;   hysterctomy     abdominal   POLYPECTOMY     right ovary removed     UPPER GASTROINTESTINAL ENDOSCOPY      Allergies  Allergen Reactions   No Known Allergies     Outpatient Encounter Medications as of 07/04/2021  Medication Sig   amLODipine (NORVASC) 2.5 MG tablet Take 1 tablet by mouth Daily.    docusate sodium (COLACE) 100 MG capsule Take 100 mg by mouth daily.   enoxaparin (LOVENOX) 40 MG/0.4ML injection Inject 0.4 mLs (40 mg total) into the skin daily for 30 doses. For 30 days post op for DVT prophylaxis   HYDROcodone-acetaminophen (NORCO/VICODIN)  5-325 MG tablet Take 1-2 tablets by mouth every 6 (six) hours as needed for up to 14 days for moderate pain or severe pain (1 tab for moderate pain and 2 tablets for severe pain).   methocarbamol (ROBAXIN) 500 MG tablet Take 1 tablet (500 mg total) by mouth 3 (three) times daily as needed for muscle spasms.   pravastatin (PRAVACHOL) 20 MG tablet Take 20 mg by mouth daily.    vitamin C (ASCORBIC ACID) 250 MG tablet Take 250 mg by mouth daily.   Vitamin D, Ergocalciferol, (DRISDOL) 1.25 MG (50000 UNIT) CAPS capsule Take 1 capsule (50,000 Units total) by mouth every 7 (seven) days.   No facility-administered encounter medications on file as of 07/04/2021.    Review of Systems  Constitutional:  Negative for activity change, appetite change, fatigue and fever.  HENT: Negative.    Eyes: Negative.   Respiratory:  Negative for cough, shortness of breath and wheezing.   Cardiovascular:  Positive for leg swelling. Negative for chest pain.  Gastrointestinal:  Negative for abdominal distention, abdominal pain, constipation, diarrhea and nausea.  Genitourinary: Negative.   Musculoskeletal:  Positive for gait problem.       Recent tibial fracture  Skin:        Surgical incision   Neurological:  Negative for dizziness, weakness, light-headedness and headaches.  Psychiatric/Behavioral:  Negative for confusion, dysphoric mood and sleep disturbance. The patient is not nervous/anxious.    Immunization History  Administered Date(s) Administered   Hepatitis A 10/02/2005, 05/05/2006   Hepatitis A, Adult 10/02/2005, 05/05/2006   Hepatitis B, adult 09/10/2018, 10/13/2018, 03/14/2019   Hepatitis B, ped/adol 09/10/2018, 10/13/2018, 03/14/2019   Influenza Inj Mdck Quad With Preservative 07/23/2018, 08/12/2019   Influenza, High Dose Seasonal PF 08/12/2019   Influenza,inj,Quad PF,6+ Mos 07/29/2017, 07/23/2018   Influenza,inj,quad, With Preservative 08/12/2019   Influenza-Unspecified 09/09/2014, 07/10/2016,  07/29/2017, 07/23/2018, 08/08/2019   Pneumococcal Conjugate-13 11/12/2014, 11/28/2015, 09/25/2019   Pneumococcal Polysaccharide-23 11/12/2014   Pneumococcal-Unspecified 11/12/2014, 09/25/2019   Tdap 12/10/2013   Zoster Recombinat (Shingrix) 02/19/2018   Zoster, Unspecified 02/18/2018   Pertinent  Health Maintenance Due  Topic Date Due   DEXA SCAN  Never done   PNA vac Low Risk Adult (2 of 2 - PPSV23) 09/24/2020   INFLUENZA VACCINE  07/10/2021   MAMMOGRAM  06/09/2023   COLONOSCOPY (Pts 45-88yr Insurance coverage will need to be confirmed)  07/06/2027   No flowsheet data found. Functional Status Survey:    Vitals:   07/04/21 1519  BP: 140/83  Pulse: 60  Resp: 18  Temp: (!) 97.5 F (36.4 C)  SpO2: 98%  Weight: 167 lb 9.6 oz (76 kg)  Height: '5\' 3"'$  (1.6  m)   Body mass index is 29.69 kg/m. Physical Exam Vitals reviewed.  Constitutional:      General: She is not in acute distress. HENT:     Head: Normocephalic.  Eyes:     General:        Right eye: No discharge.        Left eye: No discharge.  Neck:     Vascular: No carotid bruit.  Cardiovascular:     Rate and Rhythm: Normal rate and regular rhythm.     Pulses: Normal pulses.     Heart sounds: Normal heart sounds. No murmur heard. Pulmonary:     Effort: Pulmonary effort is normal. No respiratory distress.     Breath sounds: Normal breath sounds. No wheezing.  Abdominal:     General: Bowel sounds are normal. There is no distension.     Palpations: Abdomen is soft.     Tenderness: There is no abdominal tenderness.  Musculoskeletal:     Cervical back: Normal range of motion.     Right lower leg: No edema.     Left lower leg: Edema present.     Comments: Non-pitting, mild. Left ankle dorsalflexion 4/5  Lymphadenopathy:     Cervical: No cervical adenopathy.  Skin:    General: Skin is warm and dry.     Capillary Refill: Capillary refill takes less than 2 seconds.     Comments: Surgical incision x 2 to left  anterior shin, CDI, closed, no drainage.   Neurological:     General: No focal deficit present.     Mental Status: She is alert and oriented to person, place, and time.     Motor: Weakness present.     Gait: Gait abnormal.  Psychiatric:        Mood and Affect: Mood normal.        Behavior: Behavior normal.    Labs reviewed: Recent Labs    05/26/21 0222 05/27/21 0739 05/28/21 0135 06/05/21 0000  NA 139 138 139 140  K 3.4* 3.2* 3.4* 3.6  CL 102 102 103 101  CO2 '27 30 30 '$ 24*  GLUCOSE 162* 107* 119*  --   BUN 9 7* 10 13  CREATININE 0.78 0.76 0.80 0.8  CALCIUM 8.9 8.3* 8.6* 9.5   Recent Labs    05/24/21 0334  AST 22  ALT 22  ALKPHOS 78  BILITOT 0.5  PROT 6.6  ALBUMIN 3.8   Recent Labs    05/24/21 0334 05/25/21 0324 05/25/21 1803 05/26/21 0222 05/27/21 0739 06/05/21 0000  WBC 9.2   < > 9.3 9.5 8.2 5.0  NEUTROABS 7.2  --   --   --   --   --   HGB 13.4   < > 13.5 12.5 11.4* 12.8  HCT 38.4   < > 38.0 35.0* 32.6* 37  MCV 86.9   < > 88.2 86.4 88.3  --   PLT 160   < > 148* 149* 147* 283   < > = values in this interval not displayed.   Lab Results  Component Value Date   TSH 1.470 07/03/2018   Lab Results  Component Value Date   HGBA1C 5.1 08/24/2020   Lab Results  Component Value Date   CHOL 175 11/03/2019   HDL 58 11/03/2019   LDLCALC 96 11/03/2019   TRIG 120 11/03/2019    Significant Diagnostic Results in last 30 days:  MM 3D SCREEN BREAST BILATERAL  Result Date: 06/14/2021 CLINICAL DATA:  Screening.  EXAM: DIGITAL SCREENING BILATERAL MAMMOGRAM WITH TOMOSYNTHESIS AND CAD TECHNIQUE: Bilateral screening digital craniocaudal and mediolateral oblique mammograms were obtained. Bilateral screening digital breast tomosynthesis was performed. The images were evaluated with computer-aided detection. COMPARISON:  Previous exam(s). ACR Breast Density Category c: The breast tissue is heterogeneously dense, which may obscure small masses. FINDINGS: In the right breast,  2 groups of calcifications warrant further evaluation with magnified views. In the left breast, no findings suspicious for malignancy. IMPRESSION: Further evaluation is suggested for 2 groups of calcifications in the right breast. RECOMMENDATION: Diagnostic mammogram of the right breast. (Code:[id number]) The patient will be contacted regarding the findings, and additional imaging will be scheduled. BI-RADS CATEGORY  0: Incomplete. Need additional imaging evaluation and/or prior mammograms for comparison. Electronically Signed   By: Evangeline Dakin M.D.   On: 06/14/2021 10:38    Assessment/Plan 1. Tibial plateau fracture, left, closed, with routine healing, subsequent encounter - surgical incision CDI, no sob or calf pain, pain minimal, moving bowels - followed by Dr. Doreatha Martin- will start PWB 50%, advised to watch swelling- may need venous doppler if swelling persists - completed lovenox injections - cont norco 5/325 mg 1-2 tabs q 6 hrs prn for pain - cont robaxin 500 mg q 8hrs prn  2. Essential hypertension - controlled - BUN/creat 13/0.8 06/05/2021 - cont amlodipine 2.5 mg daily   3. Vitamin D deficiency - cont vitamin D 50,000 every week  4. Other hyperlipidemia - LDL 96 10/2019 - cont pravastatin - cont diet low in fats and avoid fried foods  5. Hypokalemia - during hospitalization - K+ 3.6 06/05/2021  Advised to follow up with PCP in 1-2 weeks. Continue follow ups with Dr. Doreatha Martin.    Family/ staff Communication: plan discussed with patient and nurse  Labs/tests ordered:  recommend cbc/diff and cmp in 1-2 weeks

## 2021-07-05 DIAGNOSIS — M25562 Pain in left knee: Secondary | ICD-10-CM | POA: Diagnosis not present

## 2021-07-05 DIAGNOSIS — R2689 Other abnormalities of gait and mobility: Secondary | ICD-10-CM | POA: Diagnosis not present

## 2021-07-05 DIAGNOSIS — R2681 Unsteadiness on feet: Secondary | ICD-10-CM | POA: Diagnosis not present

## 2021-07-05 DIAGNOSIS — M62562 Muscle wasting and atrophy, not elsewhere classified, left lower leg: Secondary | ICD-10-CM | POA: Diagnosis not present

## 2021-07-05 DIAGNOSIS — S82142S Displaced bicondylar fracture of left tibia, sequela: Secondary | ICD-10-CM | POA: Diagnosis not present

## 2021-07-05 DIAGNOSIS — Z9181 History of falling: Secondary | ICD-10-CM | POA: Diagnosis not present

## 2021-07-06 DIAGNOSIS — S82142S Displaced bicondylar fracture of left tibia, sequela: Secondary | ICD-10-CM | POA: Diagnosis not present

## 2021-07-06 DIAGNOSIS — R2681 Unsteadiness on feet: Secondary | ICD-10-CM | POA: Diagnosis not present

## 2021-07-06 DIAGNOSIS — R278 Other lack of coordination: Secondary | ICD-10-CM | POA: Diagnosis not present

## 2021-07-06 DIAGNOSIS — Z9181 History of falling: Secondary | ICD-10-CM | POA: Diagnosis not present

## 2021-07-06 DIAGNOSIS — M25562 Pain in left knee: Secondary | ICD-10-CM | POA: Diagnosis not present

## 2021-07-06 DIAGNOSIS — M6389 Disorders of muscle in diseases classified elsewhere, multiple sites: Secondary | ICD-10-CM | POA: Diagnosis not present

## 2021-07-06 DIAGNOSIS — M62562 Muscle wasting and atrophy, not elsewhere classified, left lower leg: Secondary | ICD-10-CM | POA: Diagnosis not present

## 2021-07-06 DIAGNOSIS — R2689 Other abnormalities of gait and mobility: Secondary | ICD-10-CM | POA: Diagnosis not present

## 2021-07-07 DIAGNOSIS — R2689 Other abnormalities of gait and mobility: Secondary | ICD-10-CM | POA: Diagnosis not present

## 2021-07-07 DIAGNOSIS — S82142S Displaced bicondylar fracture of left tibia, sequela: Secondary | ICD-10-CM | POA: Diagnosis not present

## 2021-07-07 DIAGNOSIS — M62562 Muscle wasting and atrophy, not elsewhere classified, left lower leg: Secondary | ICD-10-CM | POA: Diagnosis not present

## 2021-07-07 DIAGNOSIS — M25562 Pain in left knee: Secondary | ICD-10-CM | POA: Diagnosis not present

## 2021-07-07 DIAGNOSIS — M6389 Disorders of muscle in diseases classified elsewhere, multiple sites: Secondary | ICD-10-CM | POA: Diagnosis not present

## 2021-07-07 DIAGNOSIS — R2681 Unsteadiness on feet: Secondary | ICD-10-CM | POA: Diagnosis not present

## 2021-07-07 DIAGNOSIS — Z9181 History of falling: Secondary | ICD-10-CM | POA: Diagnosis not present

## 2021-07-07 DIAGNOSIS — R278 Other lack of coordination: Secondary | ICD-10-CM | POA: Diagnosis not present

## 2021-07-10 DIAGNOSIS — M25562 Pain in left knee: Secondary | ICD-10-CM | POA: Diagnosis not present

## 2021-07-10 DIAGNOSIS — M6389 Disorders of muscle in diseases classified elsewhere, multiple sites: Secondary | ICD-10-CM | POA: Diagnosis not present

## 2021-07-10 DIAGNOSIS — S82142S Displaced bicondylar fracture of left tibia, sequela: Secondary | ICD-10-CM | POA: Diagnosis not present

## 2021-07-10 DIAGNOSIS — R2689 Other abnormalities of gait and mobility: Secondary | ICD-10-CM | POA: Diagnosis not present

## 2021-07-10 DIAGNOSIS — R278 Other lack of coordination: Secondary | ICD-10-CM | POA: Diagnosis not present

## 2021-07-10 DIAGNOSIS — Z9181 History of falling: Secondary | ICD-10-CM | POA: Diagnosis not present

## 2021-07-10 DIAGNOSIS — R2681 Unsteadiness on feet: Secondary | ICD-10-CM | POA: Diagnosis not present

## 2021-07-10 DIAGNOSIS — M62562 Muscle wasting and atrophy, not elsewhere classified, left lower leg: Secondary | ICD-10-CM | POA: Diagnosis not present

## 2021-07-11 DIAGNOSIS — M6389 Disorders of muscle in diseases classified elsewhere, multiple sites: Secondary | ICD-10-CM | POA: Diagnosis not present

## 2021-07-11 DIAGNOSIS — Z9181 History of falling: Secondary | ICD-10-CM | POA: Diagnosis not present

## 2021-07-11 DIAGNOSIS — R2689 Other abnormalities of gait and mobility: Secondary | ICD-10-CM | POA: Diagnosis not present

## 2021-07-11 DIAGNOSIS — R278 Other lack of coordination: Secondary | ICD-10-CM | POA: Diagnosis not present

## 2021-07-11 DIAGNOSIS — M62562 Muscle wasting and atrophy, not elsewhere classified, left lower leg: Secondary | ICD-10-CM | POA: Diagnosis not present

## 2021-07-11 DIAGNOSIS — R2681 Unsteadiness on feet: Secondary | ICD-10-CM | POA: Diagnosis not present

## 2021-07-11 DIAGNOSIS — M25562 Pain in left knee: Secondary | ICD-10-CM | POA: Diagnosis not present

## 2021-07-11 DIAGNOSIS — S82142S Displaced bicondylar fracture of left tibia, sequela: Secondary | ICD-10-CM | POA: Diagnosis not present

## 2021-07-12 ENCOUNTER — Other Ambulatory Visit: Payer: Self-pay

## 2021-07-12 ENCOUNTER — Other Ambulatory Visit: Payer: Self-pay | Admitting: Internal Medicine

## 2021-07-12 ENCOUNTER — Ambulatory Visit
Admission: RE | Admit: 2021-07-12 | Discharge: 2021-07-12 | Disposition: A | Discharge status: Home / Self Care | Payer: Medicare PPO | Source: Ambulatory Visit | Attending: Internal Medicine | Admitting: Internal Medicine

## 2021-07-12 DIAGNOSIS — M62562 Muscle wasting and atrophy, not elsewhere classified, left lower leg: Secondary | ICD-10-CM | POA: Diagnosis not present

## 2021-07-12 DIAGNOSIS — R922 Inconclusive mammogram: Secondary | ICD-10-CM | POA: Diagnosis not present

## 2021-07-12 DIAGNOSIS — R928 Other abnormal and inconclusive findings on diagnostic imaging of breast: Secondary | ICD-10-CM | POA: Diagnosis not present

## 2021-07-12 DIAGNOSIS — M6389 Disorders of muscle in diseases classified elsewhere, multiple sites: Secondary | ICD-10-CM | POA: Diagnosis not present

## 2021-07-12 DIAGNOSIS — R2681 Unsteadiness on feet: Secondary | ICD-10-CM | POA: Diagnosis not present

## 2021-07-12 DIAGNOSIS — Z9181 History of falling: Secondary | ICD-10-CM | POA: Diagnosis not present

## 2021-07-12 DIAGNOSIS — R2689 Other abnormalities of gait and mobility: Secondary | ICD-10-CM | POA: Diagnosis not present

## 2021-07-12 DIAGNOSIS — R921 Mammographic calcification found on diagnostic imaging of breast: Secondary | ICD-10-CM | POA: Diagnosis not present

## 2021-07-12 DIAGNOSIS — R278 Other lack of coordination: Secondary | ICD-10-CM | POA: Diagnosis not present

## 2021-07-12 DIAGNOSIS — S82142S Displaced bicondylar fracture of left tibia, sequela: Secondary | ICD-10-CM | POA: Diagnosis not present

## 2021-07-12 DIAGNOSIS — M25562 Pain in left knee: Secondary | ICD-10-CM | POA: Diagnosis not present

## 2021-07-14 DIAGNOSIS — M6389 Disorders of muscle in diseases classified elsewhere, multiple sites: Secondary | ICD-10-CM | POA: Diagnosis not present

## 2021-07-14 DIAGNOSIS — R2689 Other abnormalities of gait and mobility: Secondary | ICD-10-CM | POA: Diagnosis not present

## 2021-07-14 DIAGNOSIS — R2681 Unsteadiness on feet: Secondary | ICD-10-CM | POA: Diagnosis not present

## 2021-07-14 DIAGNOSIS — Z9181 History of falling: Secondary | ICD-10-CM | POA: Diagnosis not present

## 2021-07-14 DIAGNOSIS — M62562 Muscle wasting and atrophy, not elsewhere classified, left lower leg: Secondary | ICD-10-CM | POA: Diagnosis not present

## 2021-07-14 DIAGNOSIS — R278 Other lack of coordination: Secondary | ICD-10-CM | POA: Diagnosis not present

## 2021-07-14 DIAGNOSIS — M25562 Pain in left knee: Secondary | ICD-10-CM | POA: Diagnosis not present

## 2021-07-14 DIAGNOSIS — S82142S Displaced bicondylar fracture of left tibia, sequela: Secondary | ICD-10-CM | POA: Diagnosis not present

## 2021-07-21 ENCOUNTER — Other Ambulatory Visit: Payer: Self-pay | Admitting: Obstetrics and Gynecology

## 2021-07-21 DIAGNOSIS — R921 Mammographic calcification found on diagnostic imaging of breast: Secondary | ICD-10-CM

## 2021-07-24 DIAGNOSIS — R2689 Other abnormalities of gait and mobility: Secondary | ICD-10-CM | POA: Diagnosis not present

## 2021-07-24 DIAGNOSIS — M6389 Disorders of muscle in diseases classified elsewhere, multiple sites: Secondary | ICD-10-CM | POA: Diagnosis not present

## 2021-07-24 DIAGNOSIS — S82142S Displaced bicondylar fracture of left tibia, sequela: Secondary | ICD-10-CM | POA: Diagnosis not present

## 2021-07-24 DIAGNOSIS — R278 Other lack of coordination: Secondary | ICD-10-CM | POA: Diagnosis not present

## 2021-07-24 DIAGNOSIS — M25562 Pain in left knee: Secondary | ICD-10-CM | POA: Diagnosis not present

## 2021-07-24 DIAGNOSIS — Z9181 History of falling: Secondary | ICD-10-CM | POA: Diagnosis not present

## 2021-07-24 DIAGNOSIS — R2681 Unsteadiness on feet: Secondary | ICD-10-CM | POA: Diagnosis not present

## 2021-07-24 DIAGNOSIS — M62562 Muscle wasting and atrophy, not elsewhere classified, left lower leg: Secondary | ICD-10-CM | POA: Diagnosis not present

## 2021-07-25 DIAGNOSIS — S82142S Displaced bicondylar fracture of left tibia, sequela: Secondary | ICD-10-CM | POA: Diagnosis not present

## 2021-07-25 DIAGNOSIS — Z9181 History of falling: Secondary | ICD-10-CM | POA: Diagnosis not present

## 2021-07-25 DIAGNOSIS — R278 Other lack of coordination: Secondary | ICD-10-CM | POA: Diagnosis not present

## 2021-07-25 DIAGNOSIS — M6389 Disorders of muscle in diseases classified elsewhere, multiple sites: Secondary | ICD-10-CM | POA: Diagnosis not present

## 2021-07-26 ENCOUNTER — Ambulatory Visit
Admission: RE | Admit: 2021-07-26 | Discharge: 2021-07-26 | Disposition: A | Discharge status: Home / Self Care | Payer: Medicare PPO | Source: Ambulatory Visit | Attending: Internal Medicine | Admitting: Internal Medicine

## 2021-07-26 ENCOUNTER — Other Ambulatory Visit: Payer: Self-pay

## 2021-07-26 DIAGNOSIS — R278 Other lack of coordination: Secondary | ICD-10-CM | POA: Diagnosis not present

## 2021-07-26 DIAGNOSIS — R921 Mammographic calcification found on diagnostic imaging of breast: Secondary | ICD-10-CM

## 2021-07-26 DIAGNOSIS — Z9181 History of falling: Secondary | ICD-10-CM | POA: Diagnosis not present

## 2021-07-26 DIAGNOSIS — N6489 Other specified disorders of breast: Secondary | ICD-10-CM | POA: Diagnosis not present

## 2021-07-26 DIAGNOSIS — M6389 Disorders of muscle in diseases classified elsewhere, multiple sites: Secondary | ICD-10-CM | POA: Diagnosis not present

## 2021-07-26 DIAGNOSIS — N6011 Diffuse cystic mastopathy of right breast: Secondary | ICD-10-CM | POA: Diagnosis not present

## 2021-07-26 DIAGNOSIS — S82142S Displaced bicondylar fracture of left tibia, sequela: Secondary | ICD-10-CM | POA: Diagnosis not present

## 2021-07-27 DIAGNOSIS — R278 Other lack of coordination: Secondary | ICD-10-CM | POA: Diagnosis not present

## 2021-07-27 DIAGNOSIS — Z9181 History of falling: Secondary | ICD-10-CM | POA: Diagnosis not present

## 2021-07-27 DIAGNOSIS — M6389 Disorders of muscle in diseases classified elsewhere, multiple sites: Secondary | ICD-10-CM | POA: Diagnosis not present

## 2021-07-27 DIAGNOSIS — S82142S Displaced bicondylar fracture of left tibia, sequela: Secondary | ICD-10-CM | POA: Diagnosis not present

## 2021-07-28 DIAGNOSIS — Z9181 History of falling: Secondary | ICD-10-CM | POA: Diagnosis not present

## 2021-07-28 DIAGNOSIS — S82142S Displaced bicondylar fracture of left tibia, sequela: Secondary | ICD-10-CM | POA: Diagnosis not present

## 2021-07-28 DIAGNOSIS — R278 Other lack of coordination: Secondary | ICD-10-CM | POA: Diagnosis not present

## 2021-07-28 DIAGNOSIS — M6389 Disorders of muscle in diseases classified elsewhere, multiple sites: Secondary | ICD-10-CM | POA: Diagnosis not present

## 2021-08-01 DIAGNOSIS — M62562 Muscle wasting and atrophy, not elsewhere classified, left lower leg: Secondary | ICD-10-CM | POA: Diagnosis not present

## 2021-08-01 DIAGNOSIS — S82142S Displaced bicondylar fracture of left tibia, sequela: Secondary | ICD-10-CM | POA: Diagnosis not present

## 2021-08-01 DIAGNOSIS — R278 Other lack of coordination: Secondary | ICD-10-CM | POA: Diagnosis not present

## 2021-08-01 DIAGNOSIS — S82142D Displaced bicondylar fracture of left tibia, subsequent encounter for closed fracture with routine healing: Secondary | ICD-10-CM | POA: Diagnosis not present

## 2021-08-01 DIAGNOSIS — R2689 Other abnormalities of gait and mobility: Secondary | ICD-10-CM | POA: Diagnosis not present

## 2021-08-01 DIAGNOSIS — M25562 Pain in left knee: Secondary | ICD-10-CM | POA: Diagnosis not present

## 2021-08-01 DIAGNOSIS — M6389 Disorders of muscle in diseases classified elsewhere, multiple sites: Secondary | ICD-10-CM | POA: Diagnosis not present

## 2021-08-01 DIAGNOSIS — Z9181 History of falling: Secondary | ICD-10-CM | POA: Diagnosis not present

## 2021-08-01 DIAGNOSIS — R2681 Unsteadiness on feet: Secondary | ICD-10-CM | POA: Diagnosis not present

## 2021-08-01 DIAGNOSIS — S82152D Displaced fracture of left tibial tuberosity, subsequent encounter for closed fracture with routine healing: Secondary | ICD-10-CM | POA: Diagnosis not present

## 2021-08-03 DIAGNOSIS — M25562 Pain in left knee: Secondary | ICD-10-CM | POA: Diagnosis not present

## 2021-08-03 DIAGNOSIS — Z9181 History of falling: Secondary | ICD-10-CM | POA: Diagnosis not present

## 2021-08-03 DIAGNOSIS — R2689 Other abnormalities of gait and mobility: Secondary | ICD-10-CM | POA: Diagnosis not present

## 2021-08-03 DIAGNOSIS — S82142S Displaced bicondylar fracture of left tibia, sequela: Secondary | ICD-10-CM | POA: Diagnosis not present

## 2021-08-03 DIAGNOSIS — R2681 Unsteadiness on feet: Secondary | ICD-10-CM | POA: Diagnosis not present

## 2021-08-03 DIAGNOSIS — M62562 Muscle wasting and atrophy, not elsewhere classified, left lower leg: Secondary | ICD-10-CM | POA: Diagnosis not present

## 2021-08-04 DIAGNOSIS — S82142S Displaced bicondylar fracture of left tibia, sequela: Secondary | ICD-10-CM | POA: Diagnosis not present

## 2021-08-04 DIAGNOSIS — R278 Other lack of coordination: Secondary | ICD-10-CM | POA: Diagnosis not present

## 2021-08-04 DIAGNOSIS — Z9181 History of falling: Secondary | ICD-10-CM | POA: Diagnosis not present

## 2021-08-04 DIAGNOSIS — M6389 Disorders of muscle in diseases classified elsewhere, multiple sites: Secondary | ICD-10-CM | POA: Diagnosis not present

## 2021-08-07 DIAGNOSIS — S82142S Displaced bicondylar fracture of left tibia, sequela: Secondary | ICD-10-CM | POA: Diagnosis not present

## 2021-08-07 DIAGNOSIS — R278 Other lack of coordination: Secondary | ICD-10-CM | POA: Diagnosis not present

## 2021-08-07 DIAGNOSIS — M6389 Disorders of muscle in diseases classified elsewhere, multiple sites: Secondary | ICD-10-CM | POA: Diagnosis not present

## 2021-08-07 DIAGNOSIS — Z9181 History of falling: Secondary | ICD-10-CM | POA: Diagnosis not present

## 2021-08-08 DIAGNOSIS — M25562 Pain in left knee: Secondary | ICD-10-CM | POA: Diagnosis not present

## 2021-08-08 DIAGNOSIS — R2689 Other abnormalities of gait and mobility: Secondary | ICD-10-CM | POA: Diagnosis not present

## 2021-08-08 DIAGNOSIS — Z9181 History of falling: Secondary | ICD-10-CM | POA: Diagnosis not present

## 2021-08-08 DIAGNOSIS — M6389 Disorders of muscle in diseases classified elsewhere, multiple sites: Secondary | ICD-10-CM | POA: Diagnosis not present

## 2021-08-08 DIAGNOSIS — R278 Other lack of coordination: Secondary | ICD-10-CM | POA: Diagnosis not present

## 2021-08-08 DIAGNOSIS — R2681 Unsteadiness on feet: Secondary | ICD-10-CM | POA: Diagnosis not present

## 2021-08-08 DIAGNOSIS — M62562 Muscle wasting and atrophy, not elsewhere classified, left lower leg: Secondary | ICD-10-CM | POA: Diagnosis not present

## 2021-08-08 DIAGNOSIS — S82142S Displaced bicondylar fracture of left tibia, sequela: Secondary | ICD-10-CM | POA: Diagnosis not present

## 2021-08-10 DIAGNOSIS — R2689 Other abnormalities of gait and mobility: Secondary | ICD-10-CM | POA: Diagnosis not present

## 2021-08-10 DIAGNOSIS — M62562 Muscle wasting and atrophy, not elsewhere classified, left lower leg: Secondary | ICD-10-CM | POA: Diagnosis not present

## 2021-08-10 DIAGNOSIS — S82142S Displaced bicondylar fracture of left tibia, sequela: Secondary | ICD-10-CM | POA: Diagnosis not present

## 2021-08-10 DIAGNOSIS — M25562 Pain in left knee: Secondary | ICD-10-CM | POA: Diagnosis not present

## 2021-08-10 DIAGNOSIS — Z9181 History of falling: Secondary | ICD-10-CM | POA: Diagnosis not present

## 2021-08-10 DIAGNOSIS — R2681 Unsteadiness on feet: Secondary | ICD-10-CM | POA: Diagnosis not present

## 2021-08-15 DIAGNOSIS — Z9181 History of falling: Secondary | ICD-10-CM | POA: Diagnosis not present

## 2021-08-15 DIAGNOSIS — R2681 Unsteadiness on feet: Secondary | ICD-10-CM | POA: Diagnosis not present

## 2021-08-15 DIAGNOSIS — M62562 Muscle wasting and atrophy, not elsewhere classified, left lower leg: Secondary | ICD-10-CM | POA: Diagnosis not present

## 2021-08-15 DIAGNOSIS — R2689 Other abnormalities of gait and mobility: Secondary | ICD-10-CM | POA: Diagnosis not present

## 2021-08-15 DIAGNOSIS — S82142S Displaced bicondylar fracture of left tibia, sequela: Secondary | ICD-10-CM | POA: Diagnosis not present

## 2021-08-15 DIAGNOSIS — D225 Melanocytic nevi of trunk: Secondary | ICD-10-CM | POA: Diagnosis not present

## 2021-08-15 DIAGNOSIS — L821 Other seborrheic keratosis: Secondary | ICD-10-CM | POA: Diagnosis not present

## 2021-08-15 DIAGNOSIS — D1801 Hemangioma of skin and subcutaneous tissue: Secondary | ICD-10-CM | POA: Diagnosis not present

## 2021-08-15 DIAGNOSIS — M25562 Pain in left knee: Secondary | ICD-10-CM | POA: Diagnosis not present

## 2021-08-15 DIAGNOSIS — L814 Other melanin hyperpigmentation: Secondary | ICD-10-CM | POA: Diagnosis not present

## 2021-08-17 DIAGNOSIS — M62562 Muscle wasting and atrophy, not elsewhere classified, left lower leg: Secondary | ICD-10-CM | POA: Diagnosis not present

## 2021-08-17 DIAGNOSIS — R2689 Other abnormalities of gait and mobility: Secondary | ICD-10-CM | POA: Diagnosis not present

## 2021-08-17 DIAGNOSIS — Z9181 History of falling: Secondary | ICD-10-CM | POA: Diagnosis not present

## 2021-08-17 DIAGNOSIS — S82142S Displaced bicondylar fracture of left tibia, sequela: Secondary | ICD-10-CM | POA: Diagnosis not present

## 2021-08-17 DIAGNOSIS — R2681 Unsteadiness on feet: Secondary | ICD-10-CM | POA: Diagnosis not present

## 2021-08-17 DIAGNOSIS — M25562 Pain in left knee: Secondary | ICD-10-CM | POA: Diagnosis not present

## 2021-08-21 DIAGNOSIS — M25562 Pain in left knee: Secondary | ICD-10-CM | POA: Diagnosis not present

## 2021-08-21 DIAGNOSIS — S82142S Displaced bicondylar fracture of left tibia, sequela: Secondary | ICD-10-CM | POA: Diagnosis not present

## 2021-08-21 DIAGNOSIS — Z9181 History of falling: Secondary | ICD-10-CM | POA: Diagnosis not present

## 2021-08-21 DIAGNOSIS — M62562 Muscle wasting and atrophy, not elsewhere classified, left lower leg: Secondary | ICD-10-CM | POA: Diagnosis not present

## 2021-08-21 DIAGNOSIS — R2689 Other abnormalities of gait and mobility: Secondary | ICD-10-CM | POA: Diagnosis not present

## 2021-08-21 DIAGNOSIS — R2681 Unsteadiness on feet: Secondary | ICD-10-CM | POA: Diagnosis not present

## 2021-08-22 ENCOUNTER — Other Ambulatory Visit: Payer: Self-pay | Admitting: Internal Medicine

## 2021-08-22 ENCOUNTER — Other Ambulatory Visit (HOSPITAL_COMMUNITY): Payer: Self-pay | Admitting: Internal Medicine

## 2021-08-22 DIAGNOSIS — G9389 Other specified disorders of brain: Secondary | ICD-10-CM

## 2021-08-23 ENCOUNTER — Other Ambulatory Visit (HOSPITAL_COMMUNITY): Payer: Self-pay | Admitting: Internal Medicine

## 2021-08-23 ENCOUNTER — Ambulatory Visit (HOSPITAL_COMMUNITY)
Admission: RE | Admit: 2021-08-23 | Discharge: 2021-08-23 | Disposition: A | Discharge status: Home / Self Care | Payer: Medicare PPO | Source: Ambulatory Visit | Attending: Internal Medicine | Admitting: Internal Medicine

## 2021-08-23 ENCOUNTER — Other Ambulatory Visit: Payer: Self-pay

## 2021-08-23 DIAGNOSIS — G9389 Other specified disorders of brain: Secondary | ICD-10-CM

## 2021-08-23 MED ORDER — GADOBUTROL 1 MMOL/ML IV SOLN
7.5000 mL | Freq: Once | INTRAVENOUS | Status: AC | PRN
  Administered 2021-08-23: 7.5 mL via INTRAVENOUS

## 2021-08-25 DIAGNOSIS — M25562 Pain in left knee: Secondary | ICD-10-CM | POA: Diagnosis not present

## 2021-08-25 DIAGNOSIS — E785 Hyperlipidemia, unspecified: Secondary | ICD-10-CM | POA: Diagnosis not present

## 2021-08-25 DIAGNOSIS — Z01419 Encounter for gynecological examination (general) (routine) without abnormal findings: Secondary | ICD-10-CM | POA: Diagnosis not present

## 2021-08-29 DIAGNOSIS — M25562 Pain in left knee: Secondary | ICD-10-CM | POA: Diagnosis not present

## 2021-08-29 DIAGNOSIS — R2681 Unsteadiness on feet: Secondary | ICD-10-CM | POA: Diagnosis not present

## 2021-08-29 DIAGNOSIS — Z9181 History of falling: Secondary | ICD-10-CM | POA: Diagnosis not present

## 2021-08-29 DIAGNOSIS — M62562 Muscle wasting and atrophy, not elsewhere classified, left lower leg: Secondary | ICD-10-CM | POA: Diagnosis not present

## 2021-08-29 DIAGNOSIS — R2689 Other abnormalities of gait and mobility: Secondary | ICD-10-CM | POA: Diagnosis not present

## 2021-08-29 DIAGNOSIS — S82142S Displaced bicondylar fracture of left tibia, sequela: Secondary | ICD-10-CM | POA: Diagnosis not present

## 2021-08-31 DIAGNOSIS — Z9181 History of falling: Secondary | ICD-10-CM | POA: Diagnosis not present

## 2021-08-31 DIAGNOSIS — M25562 Pain in left knee: Secondary | ICD-10-CM | POA: Diagnosis not present

## 2021-08-31 DIAGNOSIS — R2689 Other abnormalities of gait and mobility: Secondary | ICD-10-CM | POA: Diagnosis not present

## 2021-08-31 DIAGNOSIS — R2681 Unsteadiness on feet: Secondary | ICD-10-CM | POA: Diagnosis not present

## 2021-08-31 DIAGNOSIS — M62562 Muscle wasting and atrophy, not elsewhere classified, left lower leg: Secondary | ICD-10-CM | POA: Diagnosis not present

## 2021-08-31 DIAGNOSIS — S82142S Displaced bicondylar fracture of left tibia, sequela: Secondary | ICD-10-CM | POA: Diagnosis not present

## 2021-09-05 DIAGNOSIS — M62562 Muscle wasting and atrophy, not elsewhere classified, left lower leg: Secondary | ICD-10-CM | POA: Diagnosis not present

## 2021-09-05 DIAGNOSIS — S82142S Displaced bicondylar fracture of left tibia, sequela: Secondary | ICD-10-CM | POA: Diagnosis not present

## 2021-09-05 DIAGNOSIS — R2689 Other abnormalities of gait and mobility: Secondary | ICD-10-CM | POA: Diagnosis not present

## 2021-09-05 DIAGNOSIS — S82152D Displaced fracture of left tibial tuberosity, subsequent encounter for closed fracture with routine healing: Secondary | ICD-10-CM | POA: Diagnosis not present

## 2021-09-05 DIAGNOSIS — R2681 Unsteadiness on feet: Secondary | ICD-10-CM | POA: Diagnosis not present

## 2021-09-05 DIAGNOSIS — S82142D Displaced bicondylar fracture of left tibia, subsequent encounter for closed fracture with routine healing: Secondary | ICD-10-CM | POA: Diagnosis not present

## 2021-09-05 DIAGNOSIS — Z9181 History of falling: Secondary | ICD-10-CM | POA: Diagnosis not present

## 2021-09-05 DIAGNOSIS — M25562 Pain in left knee: Secondary | ICD-10-CM | POA: Diagnosis not present

## 2021-09-07 DIAGNOSIS — R2689 Other abnormalities of gait and mobility: Secondary | ICD-10-CM | POA: Diagnosis not present

## 2021-09-07 DIAGNOSIS — Z9181 History of falling: Secondary | ICD-10-CM | POA: Diagnosis not present

## 2021-09-07 DIAGNOSIS — R2681 Unsteadiness on feet: Secondary | ICD-10-CM | POA: Diagnosis not present

## 2021-09-07 DIAGNOSIS — S82142S Displaced bicondylar fracture of left tibia, sequela: Secondary | ICD-10-CM | POA: Diagnosis not present

## 2021-09-07 DIAGNOSIS — M25562 Pain in left knee: Secondary | ICD-10-CM | POA: Diagnosis not present

## 2021-09-07 DIAGNOSIS — M62562 Muscle wasting and atrophy, not elsewhere classified, left lower leg: Secondary | ICD-10-CM | POA: Diagnosis not present

## 2021-09-12 DIAGNOSIS — R2681 Unsteadiness on feet: Secondary | ICD-10-CM | POA: Diagnosis not present

## 2021-09-12 DIAGNOSIS — Z9181 History of falling: Secondary | ICD-10-CM | POA: Diagnosis not present

## 2021-09-12 DIAGNOSIS — M25562 Pain in left knee: Secondary | ICD-10-CM | POA: Diagnosis not present

## 2021-09-12 DIAGNOSIS — M62562 Muscle wasting and atrophy, not elsewhere classified, left lower leg: Secondary | ICD-10-CM | POA: Diagnosis not present

## 2021-09-12 DIAGNOSIS — S82142S Displaced bicondylar fracture of left tibia, sequela: Secondary | ICD-10-CM | POA: Diagnosis not present

## 2021-09-12 DIAGNOSIS — R2689 Other abnormalities of gait and mobility: Secondary | ICD-10-CM | POA: Diagnosis not present

## 2021-09-14 DIAGNOSIS — Z9181 History of falling: Secondary | ICD-10-CM | POA: Diagnosis not present

## 2021-09-14 DIAGNOSIS — S82142S Displaced bicondylar fracture of left tibia, sequela: Secondary | ICD-10-CM | POA: Diagnosis not present

## 2021-09-14 DIAGNOSIS — R2689 Other abnormalities of gait and mobility: Secondary | ICD-10-CM | POA: Diagnosis not present

## 2021-09-14 DIAGNOSIS — R2681 Unsteadiness on feet: Secondary | ICD-10-CM | POA: Diagnosis not present

## 2021-09-14 DIAGNOSIS — M62562 Muscle wasting and atrophy, not elsewhere classified, left lower leg: Secondary | ICD-10-CM | POA: Diagnosis not present

## 2021-09-14 DIAGNOSIS — M25562 Pain in left knee: Secondary | ICD-10-CM | POA: Diagnosis not present

## 2021-09-19 DIAGNOSIS — M25562 Pain in left knee: Secondary | ICD-10-CM | POA: Diagnosis not present

## 2021-09-19 DIAGNOSIS — M62562 Muscle wasting and atrophy, not elsewhere classified, left lower leg: Secondary | ICD-10-CM | POA: Diagnosis not present

## 2021-09-19 DIAGNOSIS — R2689 Other abnormalities of gait and mobility: Secondary | ICD-10-CM | POA: Diagnosis not present

## 2021-09-19 DIAGNOSIS — Z9181 History of falling: Secondary | ICD-10-CM | POA: Diagnosis not present

## 2021-09-19 DIAGNOSIS — R2681 Unsteadiness on feet: Secondary | ICD-10-CM | POA: Diagnosis not present

## 2021-09-19 DIAGNOSIS — S82142S Displaced bicondylar fracture of left tibia, sequela: Secondary | ICD-10-CM | POA: Diagnosis not present

## 2021-09-21 DIAGNOSIS — M62562 Muscle wasting and atrophy, not elsewhere classified, left lower leg: Secondary | ICD-10-CM | POA: Diagnosis not present

## 2021-09-21 DIAGNOSIS — M25562 Pain in left knee: Secondary | ICD-10-CM | POA: Diagnosis not present

## 2021-09-21 DIAGNOSIS — R2689 Other abnormalities of gait and mobility: Secondary | ICD-10-CM | POA: Diagnosis not present

## 2021-09-21 DIAGNOSIS — Z9181 History of falling: Secondary | ICD-10-CM | POA: Diagnosis not present

## 2021-09-21 DIAGNOSIS — S82142S Displaced bicondylar fracture of left tibia, sequela: Secondary | ICD-10-CM | POA: Diagnosis not present

## 2021-09-21 DIAGNOSIS — R2681 Unsteadiness on feet: Secondary | ICD-10-CM | POA: Diagnosis not present

## 2021-09-22 DIAGNOSIS — M6389 Disorders of muscle in diseases classified elsewhere, multiple sites: Secondary | ICD-10-CM | POA: Diagnosis not present

## 2021-09-22 DIAGNOSIS — S82142S Displaced bicondylar fracture of left tibia, sequela: Secondary | ICD-10-CM | POA: Diagnosis not present

## 2021-09-26 DIAGNOSIS — R2689 Other abnormalities of gait and mobility: Secondary | ICD-10-CM | POA: Diagnosis not present

## 2021-09-26 DIAGNOSIS — M25562 Pain in left knee: Secondary | ICD-10-CM | POA: Diagnosis not present

## 2021-09-26 DIAGNOSIS — R2681 Unsteadiness on feet: Secondary | ICD-10-CM | POA: Diagnosis not present

## 2021-09-26 DIAGNOSIS — M62562 Muscle wasting and atrophy, not elsewhere classified, left lower leg: Secondary | ICD-10-CM | POA: Diagnosis not present

## 2021-09-26 DIAGNOSIS — S82142S Displaced bicondylar fracture of left tibia, sequela: Secondary | ICD-10-CM | POA: Diagnosis not present

## 2021-09-26 DIAGNOSIS — Z9181 History of falling: Secondary | ICD-10-CM | POA: Diagnosis not present

## 2021-09-28 DIAGNOSIS — R2689 Other abnormalities of gait and mobility: Secondary | ICD-10-CM | POA: Diagnosis not present

## 2021-09-28 DIAGNOSIS — S82142S Displaced bicondylar fracture of left tibia, sequela: Secondary | ICD-10-CM | POA: Diagnosis not present

## 2021-09-28 DIAGNOSIS — R2681 Unsteadiness on feet: Secondary | ICD-10-CM | POA: Diagnosis not present

## 2021-09-28 DIAGNOSIS — Z9181 History of falling: Secondary | ICD-10-CM | POA: Diagnosis not present

## 2021-09-28 DIAGNOSIS — M62562 Muscle wasting and atrophy, not elsewhere classified, left lower leg: Secondary | ICD-10-CM | POA: Diagnosis not present

## 2021-09-28 DIAGNOSIS — M25562 Pain in left knee: Secondary | ICD-10-CM | POA: Diagnosis not present

## 2021-10-03 DIAGNOSIS — M6389 Disorders of muscle in diseases classified elsewhere, multiple sites: Secondary | ICD-10-CM | POA: Diagnosis not present

## 2021-10-03 DIAGNOSIS — M25562 Pain in left knee: Secondary | ICD-10-CM | POA: Diagnosis not present

## 2021-10-03 DIAGNOSIS — R2681 Unsteadiness on feet: Secondary | ICD-10-CM | POA: Diagnosis not present

## 2021-10-03 DIAGNOSIS — M62562 Muscle wasting and atrophy, not elsewhere classified, left lower leg: Secondary | ICD-10-CM | POA: Diagnosis not present

## 2021-10-03 DIAGNOSIS — Z9181 History of falling: Secondary | ICD-10-CM | POA: Diagnosis not present

## 2021-10-03 DIAGNOSIS — R2689 Other abnormalities of gait and mobility: Secondary | ICD-10-CM | POA: Diagnosis not present

## 2021-10-03 DIAGNOSIS — S82142S Displaced bicondylar fracture of left tibia, sequela: Secondary | ICD-10-CM | POA: Diagnosis not present

## 2021-10-05 DIAGNOSIS — Z9181 History of falling: Secondary | ICD-10-CM | POA: Diagnosis not present

## 2021-10-05 DIAGNOSIS — M62562 Muscle wasting and atrophy, not elsewhere classified, left lower leg: Secondary | ICD-10-CM | POA: Diagnosis not present

## 2021-10-05 DIAGNOSIS — M25562 Pain in left knee: Secondary | ICD-10-CM | POA: Diagnosis not present

## 2021-10-05 DIAGNOSIS — M6389 Disorders of muscle in diseases classified elsewhere, multiple sites: Secondary | ICD-10-CM | POA: Diagnosis not present

## 2021-10-05 DIAGNOSIS — R2681 Unsteadiness on feet: Secondary | ICD-10-CM | POA: Diagnosis not present

## 2021-10-05 DIAGNOSIS — R2689 Other abnormalities of gait and mobility: Secondary | ICD-10-CM | POA: Diagnosis not present

## 2021-10-05 DIAGNOSIS — S82142S Displaced bicondylar fracture of left tibia, sequela: Secondary | ICD-10-CM | POA: Diagnosis not present

## 2021-10-10 DIAGNOSIS — Z9181 History of falling: Secondary | ICD-10-CM | POA: Diagnosis not present

## 2021-10-10 DIAGNOSIS — S82142S Displaced bicondylar fracture of left tibia, sequela: Secondary | ICD-10-CM | POA: Diagnosis not present

## 2021-10-10 DIAGNOSIS — M6389 Disorders of muscle in diseases classified elsewhere, multiple sites: Secondary | ICD-10-CM | POA: Diagnosis not present

## 2021-10-10 DIAGNOSIS — R278 Other lack of coordination: Secondary | ICD-10-CM | POA: Diagnosis not present

## 2021-10-12 DIAGNOSIS — R2681 Unsteadiness on feet: Secondary | ICD-10-CM | POA: Diagnosis not present

## 2021-10-12 DIAGNOSIS — R2689 Other abnormalities of gait and mobility: Secondary | ICD-10-CM | POA: Diagnosis not present

## 2021-10-12 DIAGNOSIS — R278 Other lack of coordination: Secondary | ICD-10-CM | POA: Diagnosis not present

## 2021-10-12 DIAGNOSIS — M25562 Pain in left knee: Secondary | ICD-10-CM | POA: Diagnosis not present

## 2021-10-12 DIAGNOSIS — Z9181 History of falling: Secondary | ICD-10-CM | POA: Diagnosis not present

## 2021-10-12 DIAGNOSIS — M6389 Disorders of muscle in diseases classified elsewhere, multiple sites: Secondary | ICD-10-CM | POA: Diagnosis not present

## 2021-10-12 DIAGNOSIS — S82142S Displaced bicondylar fracture of left tibia, sequela: Secondary | ICD-10-CM | POA: Diagnosis not present

## 2021-10-12 DIAGNOSIS — M62562 Muscle wasting and atrophy, not elsewhere classified, left lower leg: Secondary | ICD-10-CM | POA: Diagnosis not present

## 2021-10-16 DIAGNOSIS — H2513 Age-related nuclear cataract, bilateral: Secondary | ICD-10-CM | POA: Diagnosis not present

## 2021-10-16 DIAGNOSIS — H52203 Unspecified astigmatism, bilateral: Secondary | ICD-10-CM | POA: Diagnosis not present

## 2021-10-17 ENCOUNTER — Other Ambulatory Visit: Payer: Self-pay | Admitting: Student

## 2021-10-17 DIAGNOSIS — R278 Other lack of coordination: Secondary | ICD-10-CM | POA: Diagnosis not present

## 2021-10-17 DIAGNOSIS — M7989 Other specified soft tissue disorders: Secondary | ICD-10-CM

## 2021-10-17 DIAGNOSIS — S82142S Displaced bicondylar fracture of left tibia, sequela: Secondary | ICD-10-CM | POA: Diagnosis not present

## 2021-10-17 DIAGNOSIS — M79606 Pain in leg, unspecified: Secondary | ICD-10-CM

## 2021-10-17 DIAGNOSIS — S82152D Displaced fracture of left tibial tuberosity, subsequent encounter for closed fracture with routine healing: Secondary | ICD-10-CM | POA: Diagnosis not present

## 2021-10-17 DIAGNOSIS — Z9181 History of falling: Secondary | ICD-10-CM | POA: Diagnosis not present

## 2021-10-17 DIAGNOSIS — M6389 Disorders of muscle in diseases classified elsewhere, multiple sites: Secondary | ICD-10-CM | POA: Diagnosis not present

## 2021-10-17 DIAGNOSIS — S82142D Displaced bicondylar fracture of left tibia, subsequent encounter for closed fracture with routine healing: Secondary | ICD-10-CM | POA: Diagnosis not present

## 2021-10-19 ENCOUNTER — Ambulatory Visit
Admission: RE | Admit: 2021-10-19 | Discharge: 2021-10-19 | Disposition: A | Discharge status: Home / Self Care | Payer: Medicare PPO | Source: Ambulatory Visit | Attending: Student | Admitting: Student

## 2021-10-19 DIAGNOSIS — M25562 Pain in left knee: Secondary | ICD-10-CM | POA: Diagnosis not present

## 2021-10-19 DIAGNOSIS — R278 Other lack of coordination: Secondary | ICD-10-CM | POA: Diagnosis not present

## 2021-10-19 DIAGNOSIS — S82142S Displaced bicondylar fracture of left tibia, sequela: Secondary | ICD-10-CM | POA: Diagnosis not present

## 2021-10-19 DIAGNOSIS — M79606 Pain in leg, unspecified: Secondary | ICD-10-CM

## 2021-10-19 DIAGNOSIS — Z9181 History of falling: Secondary | ICD-10-CM | POA: Diagnosis not present

## 2021-10-19 DIAGNOSIS — M6389 Disorders of muscle in diseases classified elsewhere, multiple sites: Secondary | ICD-10-CM | POA: Diagnosis not present

## 2021-10-19 DIAGNOSIS — M7989 Other specified soft tissue disorders: Secondary | ICD-10-CM | POA: Diagnosis not present

## 2021-10-19 DIAGNOSIS — R2689 Other abnormalities of gait and mobility: Secondary | ICD-10-CM | POA: Diagnosis not present

## 2021-10-19 DIAGNOSIS — R2681 Unsteadiness on feet: Secondary | ICD-10-CM | POA: Diagnosis not present

## 2021-10-19 DIAGNOSIS — M62562 Muscle wasting and atrophy, not elsewhere classified, left lower leg: Secondary | ICD-10-CM | POA: Diagnosis not present

## 2021-10-24 DIAGNOSIS — Z9889 Other specified postprocedural states: Secondary | ICD-10-CM | POA: Diagnosis not present

## 2021-10-24 DIAGNOSIS — N644 Mastodynia: Secondary | ICD-10-CM | POA: Diagnosis not present

## 2021-10-27 ENCOUNTER — Other Ambulatory Visit (HOSPITAL_BASED_OUTPATIENT_CLINIC_OR_DEPARTMENT_OTHER): Payer: Self-pay

## 2021-10-27 ENCOUNTER — Ambulatory Visit: Payer: Medicare PPO | Attending: Internal Medicine

## 2021-10-27 DIAGNOSIS — Z23 Encounter for immunization: Secondary | ICD-10-CM

## 2021-10-27 MED ORDER — MODERNA COVID-19 BIVAL BOOSTER 50 MCG/0.5ML IM SUSP
INTRAMUSCULAR | 0 refills | Status: AC
  Filled 2021-10-27: qty 0.5, 1d supply, fill #0

## 2021-10-27 NOTE — Progress Notes (Signed)
   Covid-19 Vaccination Clinic  Name:  Julie Moran    MRN: [id number] DOB: [birth year]  10/27/2021  [name] was observed post Covid-19 immunization for 15 minutes without incident. She was provided with Vaccine Information Sheet and instruction to access the V-Safe system.   [name] was instructed to call 911 with any severe reactions post vaccine: Difficulty breathing  Swelling of face and throat  A fast heartbeat  A bad rash all over body  Dizziness and weakness   Immunizations Administered     Name Date Dose VIS Date Route   Moderna Covid-19 vaccine Bivalent Booster 10/27/2021  2:54 PM 0.5 mL 07/22/2021 Intramuscular   Manufacturer: Levan Hurst   Lot: [id number]   [place]: [id number]

## 2021-10-31 ENCOUNTER — Other Ambulatory Visit: Payer: Self-pay | Admitting: Registered Nurse

## 2021-10-31 DIAGNOSIS — N644 Mastodynia: Secondary | ICD-10-CM

## 2021-11-28 DIAGNOSIS — S82142D Displaced bicondylar fracture of left tibia, subsequent encounter for closed fracture with routine healing: Secondary | ICD-10-CM | POA: Diagnosis not present

## 2021-11-29 ENCOUNTER — Ambulatory Visit
Admission: RE | Admit: 2021-11-29 | Discharge: 2021-11-29 | Disposition: A | Discharge status: Home / Self Care | Payer: Medicare PPO | Source: Ambulatory Visit | Attending: Registered Nurse | Admitting: Registered Nurse

## 2021-11-29 DIAGNOSIS — R922 Inconclusive mammogram: Secondary | ICD-10-CM | POA: Diagnosis not present

## 2021-11-29 DIAGNOSIS — N644 Mastodynia: Secondary | ICD-10-CM

## 2021-12-07 NOTE — Therapy (Signed)
OUTPATIENT PHYSICAL THERAPY LOWER EXTREMITY EVALUATION   Patient Name: Julie Moran MRN: [id number] DOB:08-11-1956, 65 y.o., female Today's Date: 12/08/2021   PT End of Session - 12/08/21 1154     Visit Number 1    Number of Visits 18    Date for PT Re-Evaluation 03/08/22    Authorization Type [employer]    PT Start Time 1100    PT Stop Time 1145    PT Time Calculation (min) 45 min    Activity Tolerance Patient tolerated treatment well;Patient limited by pain    Behavior During Therapy WFL for tasks assessed/performed             Past Medical History:  Diagnosis Date   Allergy    Atrophic vaginitis    Birt-Hogg-Dube syndrome    lung cysts    Birt-Hogg-Dube syndrome    Chest pain    Colon polyps    adenomatous   Constipation    Depression    Diverticulosis    Dry skin    Fatty liver    Floaters    Gall bladder disease    Glaucoma    Heart murmur    Herpes simplex    Hyperlipidemia    Hypertension    Joint pain    Lichen sclerosus    Pancreatitis    Pancreatitis    Pneumothorax, right    spontaneous   Shortness of breath    Shortness of breath on exertion    Swelling of both lower extremities    Trouble in sleeping    Past Surgical History:  Procedure Laterality Date   ABDOMINAL HYSTERECTOMY     BREAST BIOPSY  03/29/2006   BREAST BIOPSY Bilateral 06/14/1998   BREAST EXCISIONAL BIOPSY Bilateral 1999   BREAST LUMPECTOMY  1973,1992,2001   x5 times total , both breasts   CHOLECYSTECTOMY     COLONOSCOPY     2016   epidermoid cyst Right 02/2020   below Right breast   EXTERNAL FIXATION LEG Left 05/25/2021   Procedure: OPEN REDUCTION INTERNAL FIXATION  TIBIAL PLATEAU;  Surgeon: Shona Needles, MD;  Location: [facility];  Service: Orthopedics;  Laterality: Left;   hysterctomy     abdominal   POLYPECTOMY     right ovary removed     UPPER GASTROINTESTINAL ENDOSCOPY     Patient Active Problem List   Diagnosis Date Noted   Closed fracture of  left tibial plateau 05/25/2021   Closed bicondylar fracture of left tibial plateau 05/24/2021   Stage 3a chronic kidney disease ([place]) 11/26/2019   Other hyperlipidemia 11/09/2019   Vitamin D deficiency 10/15/2018   Insulin resistance 10/15/2018   Fatty liver 10/15/2018   Other fatigue 07/03/2018   Shortness of breath on exertion 07/03/2018   Elevated LFTs 07/03/2018   Essential hypertension 07/03/2018   Dyslipidemia 09/17/2017   Benign essential HTN 09/17/2017   Acute infection of nasal sinus 09/17/2017   Atrophic vaginitis 09/17/2017   Family history of malignant neoplasm of breast 09/17/2017   Hereditary disease 09/17/2017   Herpes simplex 30/06/6225   Lichen sclerosus et atrophicus of the vulva 09/17/2017   Reduced libido 09/17/2017   Abnormal urination 08/10/2016   Obesity with body mass index 30 or greater 08/10/2016   Pneumothorax 09/24/2014   Benign melanoma 05/19/2014   Atheroma, skin 05/19/2014   D (diarrhea) 04/27/2013   Encounter for immunization 03/11/2013   Acute antritis 12/09/2012   Cutaneous eruption 11/27/2012   Inflamed nasal mucosa 11/27/2012  Depressive disorder 03/10/2012   Elevated fasting blood sugar 03/10/2012   Adiposity 03/10/2012   Extremity numbness 01/12/2011   Personal history of endocrine, metabolic, and immunity disorder 07/12/2010   Health examination of defined subpopulation 02/17/2010   Atypical chest pain 01/26/2010   Depression, major, single episode 01/26/2010   Polypharmacy 01/26/2010   Gall stone pancreatitis 01/26/2010   Abnormal weight gain 01/26/2010   Cough 12/13/2009    PCP: Donnajean Lopes, MD  REFERRING PROVIDER: Corinne Ports, PA-C  REFERRING DIAG: Left bicondylar tibial plateau fx S/P ORIF 05/25/21  THERAPY DIAG:  Left knee pain, unspecified chronicity  Muscle weakness (generalized)  Stiffness of left knee, not elsewhere classified  Difficulty walking  ONSET DATE: 05/25/2021  SUBJECTIVE:   SUBJECTIVE  STATEMENT: Pt states the initial injury was from a dancing accident and fell/tripped. Pt states that the injury happened left proximal tibial fracture 06/14. She was transferred to [facility] rehab after hospital for additional PT/OT 6/19. Pt was NWB was 6 weeks then to 50% WB in July. Pt is cleared for FWB. Pt states she has minimal pain and no longer takes medication. She stopped PT at [facility] about a month ago. She had in room therapy at [facility] but nothing in a clinic setting. She is only able to go up and down stairs one at a time. Pt states that the knee will swell with more time up/standing. The L foot also hurts bc of swelling. Pt denies NT. Pt feels she is most limited by stamina, endurance, strength, and compensatory pain. Pt denies systemic symptoms. Pt had single piece of hardware removed. Still currently has stitch that will be removed on the 12/12/21.  PERTINENT HISTORY: HTN,   PAIN:  Are you having pain? Yes VAS scale: 0.5/10 Pain location: anterior knee Pain orientation: Left  PAIN TYPE: dull Pain description: intermittent  Aggravating factors: walking, stairs, bending, standing too long,  Relieving factors: resting  PRECAUTIONS: None  WEIGHT BEARING RESTRICTIONS: Pt reports FWB at this time.  FALLS:  Has patient fallen in last 6 months? Yes, Number of falls: 1 initial injury  LIVING ENVIRONMENT: Lives with: lives with their family and lives in an assisted living facility (independent/ "garden home")  Lives in: House/apartment Stairs: No;  Has following equipment at home: None  OCCUPATION: retired  PLOF: Independent; walking; gardening; social events  PATIENT GOALS : Return to normal; improve strength; improve mobility   OBJECTIVE:   DIAGNOSTIC FINDINGS: none available in Epic; Pt presents video of MD stating bone is healed and hardware in place.   PATIENT SURVEYS:  FOTO unavailable  COGNITION:  Overall cognitive status: Within functional limits for  tasks assessed     SENSATION:  Light touch: Appears intact  POSTURE:  Toe out position, knee flexion in standing  PALPATION: TTP of gastroc/soleus, ant tib Edema throughout L shank and dorsum of foot  LE AROM/PROM:  A/PROM Right 12/08/2021 Left 12/08/2021  Hip flexion [facility]  Hip extension 0 10  Hip internal rotation [facility]  Hip external rotation WFL 40   Knee flexion WFL 98  Knee extension 3 -11   (Blank rows = not tested)  Limited ankle DF ROM- 0 in CKC  LE MMT:  MMT Right 12/08/2021 Left 12/08/2021  Hip flexion 26.6 22.9  Hip abduction 21.5 19.8  Hip adduction 19.2 15.3  Knee flexion 25.3 15.0  Knee extension 21.5 10.1 p!   (Blank rows = not tested)  LOWER EXTREMITY SPECIAL TESTS:  Ankle special  tests: Homan's test: positive  (likely due to swelling present) Pt reports having compression sock and had doppler test done 2 months ago.   FUNCTIONAL TESTS:  5 times sit to stand: 17.8  GAIT: Distance walked: 38ft Assistive device utilized: None Level of assistance: Complete Independence Comments: antalgic, early heel off, decreased stance time   Stairs: step to pattern with UE support needed, limited DF   TODAY'S TREATMENT:   Exercises Seated Long Arc Quad - 2 x daily - 7 x weekly - 2 sets - 20 reps Standing Gastroc Stretch - 2 x daily - 7 x weekly - 1 sets - 3 reps - 30 hold Sit to Stand Without Arm Support - 1 x daily - 7 x weekly - 2 sets - 10 reps    PATIENT EDUCATION:  Education details: MOI, diagnosis, prognosis, anatomy, exercise progression, DOMS expectations, muscle firing,  envelope of function, HEP, POC  Person educated: Patient Education method: Explanation, Demonstration, Tactile cues, Verbal cues, and Handouts Education comprehension: verbalized understanding, returned demonstration, verbal cues required, tactile cues required, and needs further education   HOME EXERCISE PROGRAM: Access Code: [email or web address] URL:  [email or web address] Date: 12/08/2021 Prepared by: Daleen Bo  ASSESSMENT:  CLINICAL IMPRESSION: Patient is a 65 y.o. female who was seen today for physical therapy evaluation and treatment for s/p L tibial plateau ORIF. Pt's is laregly limited at this time by functional mobility and LE strength. Pt appears to be generally deconditioned following fall and post op sequelae. Pt is to be FWB at this time with PT, per pt report. Previous NP note states 50%. Pt unable to perform aquatic therapy at this time due to recent hardware removal. Plan to start aquatic after wound is healed.   Objective impairments include Abnormal gait, decreased activity tolerance, decreased balance, decreased endurance, decreased mobility, difficulty walking, decreased ROM, decreased strength, hypomobility, increased edema, increased fascial restrictions, increased muscle spasms, impaired flexibility, improper body mechanics, postural dysfunction, and pain. These impairments are limiting patient from cleaning, community activity, driving, meal prep, laundry, yard work, shopping, and exercise . Personal factors including Age, Fitness, Time since onset of injury/illness/exacerbation, and 1-2 comorbidities:    are also affecting patient's functional outcome. Patient will benefit from skilled PT to address above impairments and improve overall function.  REHAB POTENTIAL: Fair    CLINICAL DECISION MAKING: Evolving/moderate complexity  EVALUATION COMPLEXITY: Moderate   GOALS:  SHORT TERM GOALS:  STG Name Target Date Goal status  1 Pt will become independent with HEP in order to demonstrate synthesis of PT education.  Baseline:  01/19/2022 INITIAL  2 Pt will be able to perform 5XSTS in under 12s  in order to demonstrate functional improvement above the cut off score for adults.  01/19/2022 INITIAL  3 Pt will be able to demonstrate/report ability to walk >5 mins without pain in order to demonstrate functional  improvement and tolerance to exercise and community mobility.  Baseline: 01/19/2022 INITIAL   LONG TERM GOALS:   LTG Name Target Date Goal status  1 Pt  will become independent with final HEP in order to demonstrate synthesis of PT education.  Baseline: 03/02/2022 INITIAL  2 Pt will be able to demonstrate reciprocal stair stepping with UE in order to demonstrate functional improvement in LE function for self-care and house hold duties.  03/02/2022 INITIAL  3 Pt will be able to demonstrate normal gait without AD in order to demonstrate functional improvement in LE function for self-care and house hold duties.  Baseline: 03/02/2022  INITIAL  4 Pt will be able to demonstrate/report ability to walk >15 mins without pain in order to demonstrate functional improvement and tolerance to exercise and community mobility.  Baseline: 03/02/2022 INITIAL   PLAN: PT FREQUENCY: 1-2x/week  PT DURATION: 12 weeks  PLANNED INTERVENTIONS: Therapeutic exercises, Therapeutic activity, Neuro Muscular re-education, Balance training, Gait training, Patient/Family education, Joint mobilization, Stair training, DME instructions, Aquatic Therapy, Dry Needling, Electrical stimulation, Spinal mobilization, Cryotherapy, Moist heat, scar mobilization, Taping, Vasopneumatic device, Traction, Ultrasound, Ionotophoresis 4mg /ml Dexamethasone, and Manual therapy  PLAN FOR NEXT SESSION: review HEP, flexion and ext mobs, knee flexion and ext stretching, quad set with prop   Daleen Bo PT, DPT 12/08/21 11:56 AM  Referring diagnosis? Left bicondylar tibial plateau fx S/P ORIF 05/25/21 Treatment diagnosis? (if different than referring diagnosis) m25.56, m62.81, m25.662, r26.2 What was this (referring dx) caused by? [x]  Surgery [x]  Fall []  Ongoing issue []  Arthritis []  Other: ____________  Laterality: []  Rt [x]  Lt []  Both  Check all possible CPT codes:      []  97110 (Therapeutic Exercise)  []  92507 (SLP Treatment)  []   97112 (Neuro Re-ed)   []  92526 (Swallowing Treatment)   []  97116 (Gait Training)   []  [id number] (Cognitive Training, 1st 15 minutes) []  97140 (Manual Therapy)   []  97130 (Cognitive Training, each add'l 15 minutes)  []  97530 (Therapeutic Activities)  []  Other, List CPT Code ____________    []  38381 (Self Care)       [x]  All codes above (97110 - 97535)  []  97012 (Mechanical Traction)  [x]  97014 (E-stim Unattended)  [x]  97032 (E-stim manual)  []  97033 (Ionto)  []  97035 (Ultrasound)  [x]  97760 (Orthotic Fit) []  [id number] (Physical Performance Training) [x]  [id number] (Aquatic Therapy) []  97034 (Contrast Bath) []  [id number] (Paraffin) []  97597 (Wound Care 1st 20 sq cm) []  97598 (Wound Care each add'l 20 sq cm) []  97016 (Vasopneumatic Device) []  [id number] Comptroller) []  [id number] (Prosthetic Training)

## 2021-12-08 ENCOUNTER — Other Ambulatory Visit: Payer: Self-pay

## 2021-12-08 ENCOUNTER — Ambulatory Visit (HOSPITAL_BASED_OUTPATIENT_CLINIC_OR_DEPARTMENT_OTHER): Payer: Medicare PPO | Attending: Student | Admitting: Physical Therapy

## 2021-12-08 ENCOUNTER — Encounter (HOSPITAL_BASED_OUTPATIENT_CLINIC_OR_DEPARTMENT_OTHER): Payer: Self-pay | Admitting: Physical Therapy

## 2021-12-08 DIAGNOSIS — M25562 Pain in left knee: Secondary | ICD-10-CM | POA: Insufficient documentation

## 2021-12-08 DIAGNOSIS — M25662 Stiffness of left knee, not elsewhere classified: Secondary | ICD-10-CM | POA: Insufficient documentation

## 2021-12-08 DIAGNOSIS — R262 Difficulty in walking, not elsewhere classified: Secondary | ICD-10-CM | POA: Diagnosis not present

## 2021-12-08 DIAGNOSIS — M6281 Muscle weakness (generalized): Secondary | ICD-10-CM | POA: Insufficient documentation

## 2021-12-12 DIAGNOSIS — S82142D Displaced bicondylar fracture of left tibia, subsequent encounter for closed fracture with routine healing: Secondary | ICD-10-CM | POA: Diagnosis not present

## 2021-12-25 ENCOUNTER — Encounter (HOSPITAL_BASED_OUTPATIENT_CLINIC_OR_DEPARTMENT_OTHER): Payer: Medicare PPO | Admitting: Physical Therapy

## 2021-12-31 NOTE — Therapy (Signed)
OUTPATIENT PHYSICAL THERAPY TREATMENT NOTE   Patient Name: Julie Moran MRN: [id number] DOB:01/08/1956, 66 y.o., female Today's Date: 01/01/2022  PCP: Donnajean Lopes, MD REFERRING PROVIDER: Donnajean Lopes, MD   PT End of Session - 01/01/22 1538     Visit Number 2    Number of Visits 18    Date for PT Re-Evaluation 03/08/22    Authorization Type [employer]    PT Start Time 1156    PT Stop Time 1236    PT Time Calculation (min) 40 min    Activity Tolerance Patient tolerated treatment well    Behavior During Therapy WFL for tasks assessed/performed             Past Medical History:  Diagnosis Date   Allergy    Atrophic vaginitis    Birt-Hogg-Dube syndrome    lung cysts    Birt-Hogg-Dube syndrome    Chest pain    Colon polyps    adenomatous   Constipation    Depression    Diverticulosis    Dry skin    Fatty liver    Floaters    Gall bladder disease    Glaucoma    Heart murmur    Herpes simplex    Hyperlipidemia    Hypertension    Joint pain    Lichen sclerosus    Pancreatitis    Pancreatitis    Pneumothorax, right    spontaneous   Shortness of breath    Shortness of breath on exertion    Swelling of both lower extremities    Trouble in sleeping    Past Surgical History:  Procedure Laterality Date   ABDOMINAL HYSTERECTOMY     BREAST BIOPSY  03/29/2006   BREAST BIOPSY Bilateral 06/14/1998   BREAST EXCISIONAL BIOPSY Bilateral 1999   BREAST LUMPECTOMY  1973,1992,2001   x5 times total , both breasts   CHOLECYSTECTOMY     COLONOSCOPY     2016   epidermoid cyst Right 02/2020   below Right breast   EXTERNAL FIXATION LEG Left 05/25/2021   Procedure: OPEN REDUCTION INTERNAL FIXATION  TIBIAL PLATEAU;  Surgeon: Shona Needles, MD;  Location: [facility];  Service: Orthopedics;  Laterality: Left;   hysterctomy     abdominal   POLYPECTOMY     right ovary removed     UPPER GASTROINTESTINAL ENDOSCOPY     Patient Active Problem List    Diagnosis Date Noted   Closed fracture of left tibial plateau 05/25/2021   Closed bicondylar fracture of left tibial plateau 05/24/2021   Stage 3a chronic kidney disease ([place]) 11/26/2019   Other hyperlipidemia 11/09/2019   Vitamin D deficiency 10/15/2018   Insulin resistance 10/15/2018   Fatty liver 10/15/2018   Other fatigue 07/03/2018   Shortness of breath on exertion 07/03/2018   Elevated LFTs 07/03/2018   Essential hypertension 07/03/2018   Dyslipidemia 09/17/2017   Benign essential HTN 09/17/2017   Acute infection of nasal sinus 09/17/2017   Atrophic vaginitis 09/17/2017   Family history of malignant neoplasm of breast 09/17/2017   Hereditary disease 09/17/2017   Herpes simplex 51/01/5851   Lichen sclerosus et atrophicus of the vulva 09/17/2017   Reduced libido 09/17/2017   Abnormal urination 08/10/2016   Obesity with body mass index 30 or greater 08/10/2016   Pneumothorax 09/24/2014   Benign melanoma 05/19/2014   Atheroma, skin 05/19/2014   D (diarrhea) 04/27/2013   Encounter for immunization 03/11/2013   Acute antritis 12/09/2012   Cutaneous eruption  11/27/2012   Inflamed nasal mucosa 11/27/2012   Depressive disorder 03/10/2012   Elevated fasting blood sugar 03/10/2012   Adiposity 03/10/2012   Extremity numbness 01/12/2011   Personal history of endocrine, metabolic, and immunity disorder 07/12/2010   Health examination of defined subpopulation 02/17/2010   Atypical chest pain 01/26/2010   Depression, major, single episode 01/26/2010   Polypharmacy 01/26/2010   Gall stone pancreatitis 01/26/2010   Abnormal weight gain 01/26/2010   Cough 12/13/2009    REFERRING PROVIDER: Corinne Ports, PA-C   REFERRING DIAG: Left bicondylar tibial plateau fx S/P ORIF 05/25/21   THERAPY DIAG:  Left knee pain, unspecified chronicity   Muscle weakness (generalized)   Stiffness of left knee, not elsewhere classified   Difficulty walking   ONSET DATE: 05/25/2021    SUBJECTIVE:    SUBJECTIVE STATEMENT: Pt states the initial injury was from a dancing accident and fell/tripped.  Pt states she is FWB.   Pt is 7 months s/p Left bicondylar tibial plateau fx S/P ORIF.  Pt had a single piece of hardware removal on 11/28/2022.  Pt had her stitch removed on 12/12/2021 and reports her wound is completely closed.  Unable to check area of stitch due to pt's clothing and compression hose.  Pt denies any adverse effects after prior Rx.  Pt states she gets a crunching sound in knee with LAQ but not painful.  She spoke with MD about the crunching sound and MD informed her that was ok.   Pt is fairly compliant with HEP and is performing her exercises typically once per day.    PERTINENT HISTORY: HTN,    PAIN:  Are you having pain? Yes VAS scale:  No pain in L knee, 0-.5/10 L lateral foot pain.   Pain location: anterior knee Pain orientation: Left  PAIN TYPE: dull Pain description: intermittent  Aggravating factors: walking, stairs, bending, standing too long,  Relieving factors: resting   PRECAUTIONS: None   WEIGHT BEARING RESTRICTIONS: Pt reports FWB at this time.       PLOF: Independent; walking; gardening; social events   PATIENT GOALS : Return to normal; improve strength; improve mobility     OBJECTIVE:    DIAGNOSTIC FINDINGS: none available in Epic; Pt presents video of MD stating bone is healed and hardware in place.      LE AROM/PROM:   A/PROM Right 12/08/2021 Left 12/08/2021 Left 01/01/2022  Hip flexion [facility] WFL   Hip extension 0 10   Hip internal rotation [facility] WFL   Hip external rotation WFL 40    Knee flexion WFL 98 107 deg  Knee extension 3 -11  -7 deg   (Blank rows = not tested)          TODAY'S TREATMENT:  -Review and performed HEP. -Pt performed: -LAQ 2x10                        -sit to stand 2x10 reps    -Standing gastroc stretch 3x20 sec    -Supine SLR 2x10 reps   -quad set with heel prop with 5 sec hold x10 reps and x 5  reps   -longsitting gastroc stretch with strap 2x20-30 seconds   -S/L hip abduction x 10 reps   -TKE with RTB 2x10 reps -Pt received L Knee flexion and extension PROM in supine per pt tolerance.          PATIENT EDUCATION:  Education details: Reviewed HEP. POC and exercise form.  PT answered Pt's questions.     Person educated: Patient Education method: Explanation, Demonstration, Tactile cues, Verbal cues Education comprehension: verbalized understanding, returned demonstration, verbal cues required, tactile cues required, and needs further education     HOME EXERCISE PROGRAM: Access Code: [email or web address] URL: [email or web address] Date: 12/08/2021 Prepared by: Daleen Bo  Exercises Seated Long Arc Quad - 2 x daily - 7 x weekly - 2 sets - 20 reps Standing Gastroc Stretch - 2 x daily - 7 x weekly - 1 sets - 3 reps - 30 hold Sit to Stand Without Arm Support - 1 x daily - 7 x weekly - 2 sets - 10 reps   ASSESSMENT:   CLINICAL IMPRESSION: Pt is limited with L knee ROM though demonstrates improved extension and flexion AROM compared to prior visit.  Pt tolerated PROM well and did have discomfort with flex and extension PROM.  She has crepitus in L knee with LAQ though not painful.  Pt performed exercises well with cuing and instruction in correct form and positioning.  Pt has weakness in L LE as evidenced by performance and response of exercises.  Pt responded well to Rx reporting no pain after Rx.  Patient should benefit from skilled PT to address above impairments and improve overall function.   Objective impairments include Abnormal gait, decreased activity tolerance, decreased balance, decreased endurance, decreased mobility, difficulty walking, decreased ROM, decreased strength, hypomobility, increased edema, increased fascial restrictions, increased muscle spasms, impaired flexibility, improper body mechanics, postural dysfunction, and pain. These impairments are limiting  patient from cleaning, community activity, driving, meal prep, laundry, yard work, shopping, and exercise . Personal factors including Age, Fitness, Time since onset of injury/illness/exacerbation, and 1-2 comorbidities:    are also affecting patient's functional outcome.     REHAB POTENTIAL: Fair     CLINICAL DECISION MAKING: Evolving/moderate complexity   EVALUATION COMPLEXITY: Moderate     GOALS:   SHORT TERM GOALS:   STG Name Target Date Goal status  1 Pt will become independent with HEP in order to demonstrate synthesis of PT education.   Baseline:  01/19/2022 INITIAL  2 Pt will be able to perform 5XSTS in under 12s  in order to demonstrate functional improvement above the cut off score for adults.   01/19/2022 INITIAL  3 Pt will be able to demonstrate/report ability to walk >5 mins without pain in order to demonstrate functional improvement and tolerance to exercise and community mobility.   Baseline: 01/19/2022 INITIAL    LONG TERM GOALS:    LTG Name Target Date Goal status  1 Pt  will become independent with final HEP in order to demonstrate synthesis of PT education.   Baseline: 03/02/2022 INITIAL  2 Pt will be able to demonstrate reciprocal stair stepping with UE in order to demonstrate functional improvement in LE function for self-care and house hold duties.   03/02/2022 INITIAL  3 Pt will be able to demonstrate normal gait without AD in order to demonstrate functional improvement in LE function for self-care and house hold duties.   Baseline: 03/02/2022 INITIAL  4 Pt will be able to demonstrate/report ability to walk >15 mins without pain in order to demonstrate functional improvement and tolerance to exercise and community mobility.   Baseline: 03/02/2022 INITIAL    PLAN: PT FREQUENCY: 1-2x/week   PT DURATION: 12 weeks   PLANNED INTERVENTIONS: Therapeutic exercises, Therapeutic activity, Neuro Muscular re-education, Balance training, Gait training, Patient/Family  education, Joint mobilization, Stair training, DME instructions, Aquatic Therapy, Dry  Needling, Electrical stimulation, Spinal mobilization, Cryotherapy, Moist heat, scar mobilization, Taping, Vasopneumatic device, Traction, Ultrasound, Ionotophoresis 4mg /ml Dexamethasone, and Manual therapy   PLAN FOR NEXT SESSION: review and perform HEP.   Cont with therapeutic exercise to improve knee flexion and extension ROM and hip and quad strength.  Aquatic therapy when appropriate.       Selinda Michaels III PT, DPT 01/01/22 5:23 PM

## 2022-01-01 ENCOUNTER — Other Ambulatory Visit: Payer: Self-pay

## 2022-01-01 ENCOUNTER — Encounter (HOSPITAL_BASED_OUTPATIENT_CLINIC_OR_DEPARTMENT_OTHER): Payer: Self-pay | Admitting: Physical Therapy

## 2022-01-01 ENCOUNTER — Ambulatory Visit (HOSPITAL_BASED_OUTPATIENT_CLINIC_OR_DEPARTMENT_OTHER): Payer: Medicare PPO | Attending: Student | Admitting: Physical Therapy

## 2022-01-01 DIAGNOSIS — M25662 Stiffness of left knee, not elsewhere classified: Secondary | ICD-10-CM | POA: Diagnosis not present

## 2022-01-01 DIAGNOSIS — M25562 Pain in left knee: Secondary | ICD-10-CM | POA: Insufficient documentation

## 2022-01-01 DIAGNOSIS — M6281 Muscle weakness (generalized): Secondary | ICD-10-CM | POA: Diagnosis not present

## 2022-01-01 DIAGNOSIS — R262 Difficulty in walking, not elsewhere classified: Secondary | ICD-10-CM | POA: Insufficient documentation

## 2022-01-04 ENCOUNTER — Ambulatory Visit (HOSPITAL_BASED_OUTPATIENT_CLINIC_OR_DEPARTMENT_OTHER): Payer: Medicare PPO | Admitting: Physical Therapy

## 2022-01-04 ENCOUNTER — Encounter (HOSPITAL_BASED_OUTPATIENT_CLINIC_OR_DEPARTMENT_OTHER): Payer: Self-pay | Admitting: Physical Therapy

## 2022-01-04 ENCOUNTER — Other Ambulatory Visit: Payer: Self-pay

## 2022-01-04 DIAGNOSIS — R262 Difficulty in walking, not elsewhere classified: Secondary | ICD-10-CM | POA: Diagnosis not present

## 2022-01-04 DIAGNOSIS — M6281 Muscle weakness (generalized): Secondary | ICD-10-CM | POA: Diagnosis not present

## 2022-01-04 DIAGNOSIS — M25662 Stiffness of left knee, not elsewhere classified: Secondary | ICD-10-CM

## 2022-01-04 DIAGNOSIS — M25562 Pain in left knee: Secondary | ICD-10-CM

## 2022-01-04 NOTE — Therapy (Signed)
OUTPATIENT PHYSICAL THERAPY TREATMENT NOTE   Patient Name: Julie Moran MRN: [id number] DOB:[birth year], 66 y.o., female Today's Date: 01/04/2022  PCP: Donnajean Lopes, MD REFERRING PROVIDER: Donnajean Lopes, MD   PT End of Session - 01/04/22 1443     Visit Number 3    Number of Visits 18    Date for PT Re-Evaluation 03/08/22    Authorization Type [employer]    PT Start Time 1430    PT Stop Time 1510    PT Time Calculation (min) 40 min    Activity Tolerance Patient tolerated treatment well    Behavior During Therapy WFL for tasks assessed/performed              Past Medical History:  Diagnosis Date   Allergy    Atrophic vaginitis    Birt-Hogg-Dube syndrome    lung cysts    Birt-Hogg-Dube syndrome    Chest pain    Colon polyps    adenomatous   Constipation    Depression    Diverticulosis    Dry skin    Fatty liver    Floaters    Gall bladder disease    Glaucoma    Heart murmur    Herpes simplex    Hyperlipidemia    Hypertension    Joint pain    Lichen sclerosus    Pancreatitis    Pancreatitis    Pneumothorax, right    spontaneous   Shortness of breath    Shortness of breath on exertion    Swelling of both lower extremities    Trouble in sleeping    Past Surgical History:  Procedure Laterality Date   ABDOMINAL HYSTERECTOMY     BREAST BIOPSY  03/29/2006   BREAST BIOPSY Bilateral 06/14/1998   BREAST EXCISIONAL BIOPSY Bilateral 1999   BREAST LUMPECTOMY  1973,1992,2001   x5 times total , both breasts   CHOLECYSTECTOMY     COLONOSCOPY     2016   epidermoid cyst Right 02/2020   below Right breast   EXTERNAL FIXATION LEG Left 05/25/2021   Procedure: OPEN REDUCTION INTERNAL FIXATION  TIBIAL PLATEAU;  Surgeon: Shona Needles, MD;  Location: [place] [facility];  Service: Orthopedics;  Laterality: Left;   hysterctomy     abdominal   POLYPECTOMY     right ovary removed     UPPER GASTROINTESTINAL ENDOSCOPY     Patient Active Problem List    Diagnosis Date Noted   Closed fracture of left tibial plateau 05/25/2021   Closed bicondylar fracture of left tibial plateau 05/24/2021   Stage 3a chronic kidney disease ([place]) 11/26/2019   Other hyperlipidemia 11/09/2019   Vitamin D deficiency 10/15/2018   Insulin resistance 10/15/2018   Fatty liver 10/15/2018   Other fatigue 07/03/2018   Shortness of breath on exertion 07/03/2018   Elevated LFTs 07/03/2018   Essential hypertension 07/03/2018   Dyslipidemia 09/17/2017   Benign essential HTN 09/17/2017   Acute infection of nasal sinus 09/17/2017   Atrophic vaginitis 09/17/2017   Family history of malignant neoplasm of breast 09/17/2017   Hereditary disease 09/17/2017   Herpes simplex 61/44/3154   Lichen sclerosus et atrophicus of the vulva 09/17/2017   Reduced libido 09/17/2017   Abnormal urination 08/10/2016   Obesity with body mass index 30 or greater 08/10/2016   Pneumothorax 09/24/2014   Benign melanoma 05/19/2014   Atheroma, skin 05/19/2014   D (diarrhea) 04/27/2013   Encounter for immunization 03/11/2013   Acute antritis 12/09/2012   Cutaneous  eruption 11/27/2012   Inflamed nasal mucosa 11/27/2012   Depressive disorder 03/10/2012   Elevated fasting blood sugar 03/10/2012   Adiposity 03/10/2012   Extremity numbness 01/12/2011   Personal history of endocrine, metabolic, and immunity disorder 07/12/2010   Health examination of defined subpopulation 02/17/2010   Atypical chest pain 01/26/2010   Depression, major, single episode 01/26/2010   Polypharmacy 01/26/2010   Gall stone pancreatitis 01/26/2010   Abnormal weight gain 01/26/2010   Cough 12/13/2009    REFERRING PROVIDER: Corinne Ports, PA-C   REFERRING DIAG: Left bicondylar tibial plateau fx S/P ORIF 05/25/21   THERAPY DIAG:  Left knee pain, unspecified chronicity   Muscle weakness (generalized)   Stiffness of left knee, not elsewhere classified   Difficulty walking   ONSET DATE: 05/25/2021    SUBJECTIVE:    SUBJECTIVE STATEMENT: Pt states she has some back pain today in the R side of her back. She did not have a specific MOI. "Last time this happened I had a ruptured disc."   Knee feels about the same and she is compliant with HEP.    PERTINENT HISTORY: HTN,    PAIN:  Are you having pain? Yes VAS scale:  No pain in L knee, 1/10 L lateral foot pain.   Pain location: anterior knee Pain orientation: Left  PAIN TYPE: dull Pain description: intermittent  Aggravating factors: walking, stairs, bending, standing too long,  Relieving factors: resting   PRECAUTIONS: None   WEIGHT BEARING RESTRICTIONS: Pt reports FWB at this time.       PLOF: Independent; walking; gardening; social events   PATIENT GOALS : Return to normal; improve strength; improve mobility     OBJECTIVE:    DIAGNOSTIC FINDINGS: none available in Epic; Pt presents video of MD stating bone is healed and hardware in place.      LE AROM/PROM:   A/PROM Right 12/08/2021 Left 12/08/2021 Left 01/01/2022  Hip flexion [facility] WFL   Hip extension 0 10   Hip internal rotation [facility] WFL   Hip external rotation WFL 40    Knee flexion WFL 98 107 deg  Knee extension 3 -11  -7 deg   (Blank rows = not tested)          TODAY'S TREATMENT:  LTR 3s 10x PPT 2s 10x LAQ 3lbs 2x10 -Standing HS curls 3x10 -Stair step flexion stretch 10s 10x -sit to stand 2x10 reps -tailgate knee flexion stretch 10s 10x -standing gastroc stretch with strap 2x20-30 seconds -TKE with RTB 2x10 reps     PATIENT EDUCATION:  Education details: self back pain management and monitoring of symptoms, anatomy, exercise progression, DOMS expectations, muscle firing,  envelope of function, HEP, POC     Person educated: Patient Education method: Explanation, Demonstration, Tactile cues, Verbal cues Education comprehension: verbalized understanding, returned demonstration, verbal cues required, tactile cues required, and needs further  education     HOME EXERCISE PROGRAM: Access Code: [email or web address] URL: [email or web address] Date: 12/08/2021 Prepared by: Daleen Bo    ASSESSMENT:   CLINICAL IMPRESSION: Pt able to progress with L knee ROM and strengthening exercise at today's session. Pt's back pain initially limited movement at beginning of today's session so light stretching indicated in order to promote L LE based exercise. Pt with report of improved pain following stretching and able to continue with knee program. L/S stretching exercises provided to perform PRN for slef pain management. Negative slump testing during session today.   Pt able to incorporate CKC knee flexion stretching for HEP  and progress from [place]. Plan to perform manual on L knee to promote TKE and flexion at next session. Pt does have healed incision site at this time. Plan to transition to aquatics as able.  Patient should benefit from skilled PT to address above impairments and improve overall function.   Objective impairments include Abnormal gait, decreased activity tolerance, decreased balance, decreased endurance, decreased mobility, difficulty walking, decreased ROM, decreased strength, hypomobility, increased edema, increased fascial restrictions, increased muscle spasms, impaired flexibility, improper body mechanics, postural dysfunction, and pain. These impairments are limiting patient from cleaning, community activity, driving, meal prep, laundry, yard work, shopping, and exercise . Personal factors including Age, Fitness, Time since onset of injury/illness/exacerbation, and 1-2 comorbidities:    are also affecting patient's functional outcome.     REHAB POTENTIAL: Fair     CLINICAL DECISION MAKING: Evolving/moderate complexity   EVALUATION COMPLEXITY: Moderate     GOALS:   SHORT TERM GOALS:   STG Name Target Date Goal status  1 Pt will become independent with HEP in order to demonstrate synthesis of PT education.    Baseline:  01/19/2022 INITIAL  2 Pt will be able to perform 5XSTS in under 12s  in order to demonstrate functional improvement above the cut off score for adults.   01/19/2022 INITIAL  3 Pt will be able to demonstrate/report ability to walk >5 mins without pain in order to demonstrate functional improvement and tolerance to exercise and community mobility.   Baseline: 01/19/2022 INITIAL    LONG TERM GOALS:    LTG Name Target Date Goal status  1 Pt  will become independent with final HEP in order to demonstrate synthesis of PT education.   Baseline: 03/02/2022 INITIAL  2 Pt will be able to demonstrate reciprocal stair stepping with UE in order to demonstrate functional improvement in LE function for self-care and house hold duties.   03/02/2022 INITIAL  3 Pt will be able to demonstrate normal gait without AD in order to demonstrate functional improvement in LE function for self-care and house hold duties.   Baseline: 03/02/2022 INITIAL  4 Pt will be able to demonstrate/report ability to walk >15 mins without pain in order to demonstrate functional improvement and tolerance to exercise and community mobility.   Baseline: 03/02/2022 INITIAL    PLAN: PT FREQUENCY: 1-2x/week   PT DURATION: 12 weeks   PLANNED INTERVENTIONS: Therapeutic exercises, Therapeutic activity, Neuro Muscular re-education, Balance training, Gait training, Patient/Family education, Joint mobilization, Stair training, DME instructions, Aquatic Therapy, Dry Needling, Electrical stimulation, Spinal mobilization, Cryotherapy, Moist heat, scar mobilization, Taping, Vasopneumatic device, Traction, Ultrasound, Ionotophoresis 4mg /ml Dexamethasone, and Manual therapy   PLAN FOR NEXT SESSION: review and perform HEP.   Cont with therapeutic exercise to improve knee flexion and extension ROM and hip and quad strength.  Aquatic therapy when appropriate.      Daleen Bo PT, DPT 01/04/22 3:15 PM

## 2022-01-08 ENCOUNTER — Other Ambulatory Visit: Payer: Self-pay

## 2022-01-08 ENCOUNTER — Ambulatory Visit (HOSPITAL_BASED_OUTPATIENT_CLINIC_OR_DEPARTMENT_OTHER): Payer: Medicare PPO | Admitting: Physical Therapy

## 2022-01-08 ENCOUNTER — Encounter (HOSPITAL_BASED_OUTPATIENT_CLINIC_OR_DEPARTMENT_OTHER): Payer: Self-pay | Admitting: Physical Therapy

## 2022-01-08 DIAGNOSIS — M6281 Muscle weakness (generalized): Secondary | ICD-10-CM | POA: Diagnosis not present

## 2022-01-08 DIAGNOSIS — M25662 Stiffness of left knee, not elsewhere classified: Secondary | ICD-10-CM

## 2022-01-08 DIAGNOSIS — M25562 Pain in left knee: Secondary | ICD-10-CM

## 2022-01-08 DIAGNOSIS — R262 Difficulty in walking, not elsewhere classified: Secondary | ICD-10-CM

## 2022-01-08 NOTE — Therapy (Addendum)
OUTPATIENT PHYSICAL THERAPY TREATMENT NOTE   Patient Name: Julie Moran MRN: [id number] DOB:Apr 10, 1956, 66 y.o., female Today's Date: 01/08/2022  PCP: Donnajean Lopes, MD REFERRING PROVIDER: Donnajean Lopes, MD   PT End of Session - 01/08/22 1420     Visit Number 4    Number of Visits 18    Date for PT Re-Evaluation 03/08/22    Authorization Type [employer]    PT Start Time 1345    PT Stop Time 1425    PT Time Calculation (min) 40 min    Activity Tolerance Patient tolerated treatment well    Behavior During Therapy WFL for tasks assessed/performed               Past Medical History:  Diagnosis Date   Allergy    Atrophic vaginitis    Birt-Hogg-Dube syndrome    lung cysts    Birt-Hogg-Dube syndrome    Chest pain    Colon polyps    adenomatous   Constipation    Depression    Diverticulosis    Dry skin    Fatty liver    Floaters    Gall bladder disease    Glaucoma    Heart murmur    Herpes simplex    Hyperlipidemia    Hypertension    Joint pain    Lichen sclerosus    Pancreatitis    Pancreatitis    Pneumothorax, right    spontaneous   Shortness of breath    Shortness of breath on exertion    Swelling of both lower extremities    Trouble in sleeping    Past Surgical History:  Procedure Laterality Date   ABDOMINAL HYSTERECTOMY     BREAST BIOPSY  03/29/2006   BREAST BIOPSY Bilateral 06/14/1998   BREAST EXCISIONAL BIOPSY Bilateral 1999   BREAST LUMPECTOMY  1973,1992,2001   x5 times total , both breasts   CHOLECYSTECTOMY     COLONOSCOPY     2016   epidermoid cyst Right 02/2020   below Right breast   EXTERNAL FIXATION LEG Left 05/25/2021   Procedure: OPEN REDUCTION INTERNAL FIXATION  TIBIAL PLATEAU;  Surgeon: Shona Needles, MD;  Location: [place] [facility];  Service: Orthopedics;  Laterality: Left;   hysterctomy     abdominal   POLYPECTOMY     right ovary removed     UPPER GASTROINTESTINAL ENDOSCOPY     Patient Active Problem List    Diagnosis Date Noted   Closed fracture of left tibial plateau 05/25/2021   Closed bicondylar fracture of left tibial plateau 05/24/2021   Stage 3a chronic kidney disease ([place]) 11/26/2019   Other hyperlipidemia 11/09/2019   Vitamin D deficiency 10/15/2018   Insulin resistance 10/15/2018   Fatty liver 10/15/2018   Other fatigue 07/03/2018   Shortness of breath on exertion 07/03/2018   Elevated LFTs 07/03/2018   Essential hypertension 07/03/2018   Dyslipidemia 09/17/2017   Benign essential HTN 09/17/2017   Acute infection of nasal sinus 09/17/2017   Atrophic vaginitis 09/17/2017   Family history of malignant neoplasm of breast 09/17/2017   Hereditary disease 09/17/2017   Herpes simplex 62/83/6629   Lichen sclerosus et atrophicus of the vulva 09/17/2017   Reduced libido 09/17/2017   Abnormal urination 08/10/2016   Obesity with body mass index 30 or greater 08/10/2016   Pneumothorax 09/24/2014   Benign melanoma 05/19/2014   Atheroma, skin 05/19/2014   D (diarrhea) 04/27/2013   Encounter for immunization 03/11/2013   Acute antritis 12/09/2012  Cutaneous eruption 11/27/2012   Inflamed nasal mucosa 11/27/2012   Depressive disorder 03/10/2012   Elevated fasting blood sugar 03/10/2012   Adiposity 03/10/2012   Extremity numbness 01/12/2011   Personal history of endocrine, metabolic, and immunity disorder 07/12/2010   Health examination of defined subpopulation 02/17/2010   Atypical chest pain 01/26/2010   Depression, major, single episode 01/26/2010   Polypharmacy 01/26/2010   Gall stone pancreatitis 01/26/2010   Abnormal weight gain 01/26/2010   Cough 12/13/2009    REFERRING PROVIDER: Corinne Ports, PA-C   REFERRING DIAG: Left bicondylar tibial plateau fx S/P ORIF 05/25/21   THERAPY DIAG:  Left knee pain, unspecified chronicity   Muscle weakness (generalized)   Stiffness of left knee, not elsewhere classified   Difficulty walking   ONSET DATE: 05/25/2021    SUBJECTIVE:    SUBJECTIVE STATEMENT: Pt reports no issues opain with new HEP. No pain today.    PERTINENT HISTORY: HTN,    PAIN:  Are you having pain? Yes VAS scale:  No pain in L knee, 1/10 L lateral foot pain.   Pain location: anterior knee Pain orientation: Left  PAIN TYPE: dull Pain description: intermittent  Aggravating factors: walking, stairs, bending, standing too long,  Relieving factors: resting   PRECAUTIONS: None   WEIGHT BEARING RESTRICTIONS: Pt reports FWB at this time.       PLOF: Independent; walking; gardening; social events   PATIENT GOALS : Return to normal; improve strength; improve mobility     OBJECTIVE:    DIAGNOSTIC FINDINGS: none available in Epic; Pt presents video of MD stating bone is healed and hardware in place.      LE AROM/PROM:   A/PROM Right 12/08/2021 Left 12/08/2021 Left 01/01/2022 L  1/30  Hip flexion [facility] WFL    Hip extension 0 10    Hip internal rotation [facility] WFL    Hip external rotation WFL 40     Knee flexion WFL 98 107 deg 124  Knee extension 3 -11  -7 deg    (Blank rows = not tested)          TODAY'S TREATMENT:  Manual flexion (tibia posterior) and TKE ext mob (tibial ER) grade IV- L knee  -Standing HS curls 3x10 -Stair step flexion stretch 10s 10x -sit to stand 2x10 reps 2" block under L foot -tailgate knee flexion stretch 10s 10x -standing gastroc stretch with strap 3x 20-30 seconds -TKE with GTB 2x10 reps -attempted heel tap on 2" too difficult      PATIENT EDUCATION:  Education details: anatomy, exercise progression, DOMS expectations, muscle firing,  envelope of function, HEP, POC     Person educated: Patient Education method: Explanation, Demonstration, Tactile cues, Verbal cues Education comprehension: verbalized understanding, returned demonstration, verbal cues required, tactile cues required, and needs further education     HOME EXERCISE PROGRAM: Access Code: [email or web address] URL:  [email or web address] Date: 12/08/2021 Prepared by: Daleen Bo    ASSESSMENT:   CLINICAL IMPRESSION: Pt with good response to joint mobilizations today with some gains in both flexion and extension. Pt then able to continue with CKC loading of the L knee without pain in order to try and maintain functional ROM for standing, stairs stepping, and dynamic movements. Pt was able to progress L quad strengthening as well.  Pt would like to have aquatic HEP vs getting into pool at future session.Patient should benefit from skilled PT to address above impairments and improve overall function.   Objective impairments include Abnormal gait, decreased activity  tolerance, decreased balance, decreased endurance, decreased mobility, difficulty walking, decreased ROM, decreased strength, hypomobility, increased edema, increased fascial restrictions, increased muscle spasms, impaired flexibility, improper body mechanics, postural dysfunction, and pain. These impairments are limiting patient from cleaning, community activity, driving, meal prep, laundry, yard work, shopping, and exercise . Personal factors including Age, Fitness, Time since onset of injury/illness/exacerbation, and 1-2 comorbidities:    are also affecting patient's functional outcome.     REHAB POTENTIAL: Fair     CLINICAL DECISION MAKING: Evolving/moderate complexity   EVALUATION COMPLEXITY: Moderate     GOALS:   SHORT TERM GOALS:   STG Name Target Date Goal status  1 Pt will become independent with HEP in order to demonstrate synthesis of PT education.   Baseline:  01/19/2022 INITIAL  2 Pt will be able to perform 5XSTS in under 12s  in order to demonstrate functional improvement above the cut off score for adults.   01/19/2022 INITIAL  3 Pt will be able to demonstrate/report ability to walk >5 mins without pain in order to demonstrate functional improvement and tolerance to exercise and community mobility.   Baseline:  01/19/2022 INITIAL    LONG TERM GOALS:    LTG Name Target Date Goal status  1 Pt  will become independent with final HEP in order to demonstrate synthesis of PT education.   Baseline: 03/02/2022 INITIAL  2 Pt will be able to demonstrate reciprocal stair stepping with UE in order to demonstrate functional improvement in LE function for self-care and house hold duties.   03/02/2022 INITIAL  3 Pt will be able to demonstrate normal gait without AD in order to demonstrate functional improvement in LE function for self-care and house hold duties.   Baseline: 03/02/2022 INITIAL  4 Pt will be able to demonstrate/report ability to walk >15 mins without pain in order to demonstrate functional improvement and tolerance to exercise and community mobility.   Baseline: 03/02/2022 INITIAL    PLAN: PT FREQUENCY: 1-2x/week   PT DURATION: 12 weeks   PLANNED INTERVENTIONS: Therapeutic exercises, Therapeutic activity, Neuro Muscular re-education, Balance training, Gait training, Patient/Family education, Joint mobilization, Stair training, DME instructions, Aquatic Therapy, Dry Needling, Electrical stimulation, Spinal mobilization, Cryotherapy, Moist heat, scar mobilization, Taping, Vasopneumatic device, Traction, Ultrasound, Ionotophoresis 4mg /ml Dexamethasone, and Manual therapy   PLAN FOR NEXT SESSION: review and perform HEP.   Cont with therapeutic exercise to improve knee flexion and extension ROM and hip and quad strength; LAQ 5lbs or more   Daleen Bo PT, DPT 01/08/22 2:53 PM

## 2022-01-11 ENCOUNTER — Ambulatory Visit (HOSPITAL_BASED_OUTPATIENT_CLINIC_OR_DEPARTMENT_OTHER): Payer: Medicare PPO | Attending: Student | Admitting: Physical Therapy

## 2022-01-11 ENCOUNTER — Other Ambulatory Visit: Payer: Self-pay

## 2022-01-11 ENCOUNTER — Encounter (HOSPITAL_BASED_OUTPATIENT_CLINIC_OR_DEPARTMENT_OTHER): Payer: Self-pay | Admitting: Physical Therapy

## 2022-01-11 DIAGNOSIS — M25662 Stiffness of left knee, not elsewhere classified: Secondary | ICD-10-CM | POA: Insufficient documentation

## 2022-01-11 DIAGNOSIS — R262 Difficulty in walking, not elsewhere classified: Secondary | ICD-10-CM | POA: Insufficient documentation

## 2022-01-11 DIAGNOSIS — M6281 Muscle weakness (generalized): Secondary | ICD-10-CM | POA: Diagnosis not present

## 2022-01-11 DIAGNOSIS — M25562 Pain in left knee: Secondary | ICD-10-CM | POA: Insufficient documentation

## 2022-01-11 NOTE — Therapy (Signed)
OUTPATIENT PHYSICAL THERAPY TREATMENT NOTE   Patient Name: Julie Moran MRN: [id number] DOB:Apr 22, 1956, 66 y.o., female Today's Date: 01/11/2022  PCP: Donnajean Lopes, MD REFERRING PROVIDER: Donnajean Lopes, MD   PT End of Session - 01/11/22 1348     Visit Number 5    Number of Visits 18    Date for PT Re-Evaluation 03/08/22    Authorization Type [employer]    PT Start Time 1345    PT Stop Time 1425    PT Time Calculation (min) 40 min    Activity Tolerance Patient tolerated treatment well    Behavior During Therapy WFL for tasks assessed/performed                Past Medical History:  Diagnosis Date   Allergy    Atrophic vaginitis    Birt-Hogg-Dube syndrome    lung cysts    Birt-Hogg-Dube syndrome    Chest pain    Colon polyps    adenomatous   Constipation    Depression    Diverticulosis    Dry skin    Fatty liver    Floaters    Gall bladder disease    Glaucoma    Heart murmur    Herpes simplex    Hyperlipidemia    Hypertension    Joint pain    Lichen sclerosus    Pancreatitis    Pancreatitis    Pneumothorax, right    spontaneous   Shortness of breath    Shortness of breath on exertion    Swelling of both lower extremities    Trouble in sleeping    Past Surgical History:  Procedure Laterality Date   ABDOMINAL HYSTERECTOMY     BREAST BIOPSY  03/29/2006   BREAST BIOPSY Bilateral 06/14/1998   BREAST EXCISIONAL BIOPSY Bilateral 1999   BREAST LUMPECTOMY  1973,1992,2001   x5 times total , both breasts   CHOLECYSTECTOMY     COLONOSCOPY     2016   epidermoid cyst Right 02/2020   below Right breast   EXTERNAL FIXATION LEG Left 05/25/2021   Procedure: OPEN REDUCTION INTERNAL FIXATION  TIBIAL PLATEAU;  Surgeon: Shona Needles, MD;  Location: [place] [facility];  Service: Orthopedics;  Laterality: Left;   hysterctomy     abdominal   POLYPECTOMY     right ovary removed     UPPER GASTROINTESTINAL ENDOSCOPY     Patient Active Problem List    Diagnosis Date Noted   Closed fracture of left tibial plateau 05/25/2021   Closed bicondylar fracture of left tibial plateau 05/24/2021   Stage 3a chronic kidney disease ([place]) 11/26/2019   Other hyperlipidemia 11/09/2019   Vitamin D deficiency 10/15/2018   Insulin resistance 10/15/2018   Fatty liver 10/15/2018   Other fatigue 07/03/2018   Shortness of breath on exertion 07/03/2018   Elevated LFTs 07/03/2018   Essential hypertension 07/03/2018   Dyslipidemia 09/17/2017   Benign essential HTN 09/17/2017   Acute infection of nasal sinus 09/17/2017   Atrophic vaginitis 09/17/2017   Family history of malignant neoplasm of breast 09/17/2017   Hereditary disease 09/17/2017   Herpes simplex 67/61/9509   Lichen sclerosus et atrophicus of the vulva 09/17/2017   Reduced libido 09/17/2017   Abnormal urination 08/10/2016   Obesity with body mass index 30 or greater 08/10/2016   Pneumothorax 09/24/2014   Benign melanoma 05/19/2014   Atheroma, skin 05/19/2014   D (diarrhea) 04/27/2013   Encounter for immunization 03/11/2013   Acute antritis 12/09/2012  Cutaneous eruption 11/27/2012   Inflamed nasal mucosa 11/27/2012   Depressive disorder 03/10/2012   Elevated fasting blood sugar 03/10/2012   Adiposity 03/10/2012   Extremity numbness 01/12/2011   Personal history of endocrine, metabolic, and immunity disorder 07/12/2010   Health examination of defined subpopulation 02/17/2010   Atypical chest pain 01/26/2010   Depression, major, single episode 01/26/2010   Polypharmacy 01/26/2010   Gall stone pancreatitis 01/26/2010   Abnormal weight gain 01/26/2010   Cough 12/13/2009    REFERRING PROVIDER: Corinne Ports, PA-C   REFERRING DIAG: Left bicondylar tibial plateau fx S/P ORIF 05/25/21   THERAPY DIAG:  Left knee pain, unspecified chronicity   Muscle weakness (generalized)   Stiffness of left knee, not elsewhere classified   Difficulty walking   ONSET DATE: 05/25/2021    SUBJECTIVE:    SUBJECTIVE STATEMENT: Pt reports no issues with new HEP. Pt was not sore after last session.    PERTINENT HISTORY: HTN,    PAIN:  Are you having pain? No VAS scale:  No pain in L knee, 1/10 L lateral foot pain.   Pain location: anterior knee Pain orientation: Left  PAIN TYPE: dull Pain description: intermittent  Aggravating factors: walking, stairs, bending, standing too long,  Relieving factors: resting   PRECAUTIONS: None   WEIGHT BEARING RESTRICTIONS: Pt reports FWB at this time.  PLOF: Independent; walking; gardening; social events   PATIENT GOALS : Return to normal; improve strength; improve mobility     OBJECTIVE:    DIAGNOSTIC FINDINGS: none available in Epic; Pt presents video of MD stating bone is healed and hardware in place.      LE AROM/PROM:   A/PROM Right 12/08/2021 Left 12/08/2021 Left 01/01/2022 L  1/30  Hip flexion [facility] WFL    Hip extension 0 10    Hip internal rotation [facility] WFL    Hip external rotation WFL 40     Knee flexion WFL 98 107 deg 124  Knee extension 3 -11  -7 deg    (Blank rows = not tested)          TODAY'S TREATMENT:  Manual flexion (tibia posterior) and TKE ext mob (tibial ER) grade IV- L knee  122 flexion, 0 ext at end of session  L3 51min  -LAQ 5lbs 3x10 -Standing HS curls 1lb 3x10 -RTB sidestepping 76ft x2 -Stair step flexion stretch 10s 10x -sit to stand 2x10 reps 2" block under L foot (perform at next)  -tailgate knee flexion stretch 10s 10x (perform at next) -standing gastroc stretch 3x 20-30 seconds  -standing heel tap with UE support- focused on TKE and frontal plane knee control 2x10     PATIENT EDUCATION:  Education details: anatomy, exercise progression, DOMS expectations, muscle firing,  envelope of function, HEP, POC  Person educated: Patient Education method: Explanation, Demonstration, Tactile cues, Verbal cues Education comprehension: verbalized understanding, returned  demonstration, verbal cues required, tactile cues required, and needs further education     HOME EXERCISE PROGRAM: Access Code: [email or web address] URL: [email or web address] Date: 12/08/2021 Prepared by: Daleen Bo    ASSESSMENT:   CLINICAL IMPRESSION: Pt able to continue with progression of quad strength hip strength on L LE today without increasing pain. Pt able to reach full TKE today and 122 deg of flexion following mobilization and exercise. Pt land HEP not changed at this time. However, pt was provided with aquatic HEP and form/technique was reviewed/demo'd for safe independent performance at [facility]. Plan to continue with quad strength and knee ROM  progression. Patient should benefit from skilled PT to address above impairments and improve overall function.   Objective impairments include Abnormal gait, decreased activity tolerance, decreased balance, decreased endurance, decreased mobility, difficulty walking, decreased ROM, decreased strength, hypomobility, increased edema, increased fascial restrictions, increased muscle spasms, impaired flexibility, improper body mechanics, postural dysfunction, and pain. These impairments are limiting patient from cleaning, community activity, driving, meal prep, laundry, yard work, shopping, and exercise . Personal factors including Age, Fitness, Time since onset of injury/illness/exacerbation, and 1-2 comorbidities:    are also affecting patient's functional outcome.     REHAB POTENTIAL: Fair     CLINICAL DECISION MAKING: Evolving/moderate complexity   EVALUATION COMPLEXITY: Moderate     GOALS:   SHORT TERM GOALS:   STG Name Target Date Goal status  1 Pt will become independent with HEP in order to demonstrate synthesis of PT education.   Baseline:  01/19/2022 INITIAL  2 Pt will be able to perform 5XSTS in under 12s  in order to demonstrate functional improvement above the cut off score for adults.   01/19/2022 INITIAL  3 Pt will  be able to demonstrate/report ability to walk >5 mins without pain in order to demonstrate functional improvement and tolerance to exercise and community mobility.   Baseline: 01/19/2022 INITIAL    LONG TERM GOALS:    LTG Name Target Date Goal status  1 Pt  will become independent with final HEP in order to demonstrate synthesis of PT education.   Baseline: 03/02/2022 INITIAL  2 Pt will be able to demonstrate reciprocal stair stepping with UE in order to demonstrate functional improvement in LE function for self-care and house hold duties.   03/02/2022 INITIAL  3 Pt will be able to demonstrate normal gait without AD in order to demonstrate functional improvement in LE function for self-care and house hold duties.   Baseline: 03/02/2022 INITIAL  4 Pt will be able to demonstrate/report ability to walk >15 mins without pain in order to demonstrate functional improvement and tolerance to exercise and community mobility.   Baseline: 03/02/2022 INITIAL    PLAN: PT FREQUENCY: 1-2x/week   PT DURATION: 12 weeks   PLANNED INTERVENTIONS: Therapeutic exercises, Therapeutic activity, Neuro Muscular re-education, Balance training, Gait training, Patient/Family education, Joint mobilization, Stair training, DME instructions, Aquatic Therapy, Dry Needling, Electrical stimulation, Spinal mobilization, Cryotherapy, Moist heat, scar mobilization, Taping, Vasopneumatic device, Traction, Ultrasound, Ionotophoresis 4mg /ml Dexamethasone, and Manual therapy   PLAN FOR NEXT SESSION: review and perform HEP.   Cont with therapeutic exercise to improve knee flexion and extension ROM and hip and quad strength; LAQ 5lbs or more   Daleen Bo PT, DPT 01/11/22 2:41 PM

## 2022-01-15 ENCOUNTER — Encounter (HOSPITAL_BASED_OUTPATIENT_CLINIC_OR_DEPARTMENT_OTHER): Payer: Self-pay | Admitting: Physical Therapy

## 2022-01-15 ENCOUNTER — Other Ambulatory Visit: Payer: Self-pay

## 2022-01-15 ENCOUNTER — Ambulatory Visit (HOSPITAL_BASED_OUTPATIENT_CLINIC_OR_DEPARTMENT_OTHER): Payer: Medicare PPO | Admitting: Physical Therapy

## 2022-01-15 DIAGNOSIS — R262 Difficulty in walking, not elsewhere classified: Secondary | ICD-10-CM

## 2022-01-15 DIAGNOSIS — M6281 Muscle weakness (generalized): Secondary | ICD-10-CM

## 2022-01-15 DIAGNOSIS — M25562 Pain in left knee: Secondary | ICD-10-CM | POA: Diagnosis not present

## 2022-01-15 DIAGNOSIS — M25662 Stiffness of left knee, not elsewhere classified: Secondary | ICD-10-CM | POA: Diagnosis not present

## 2022-01-15 NOTE — Therapy (Signed)
OUTPATIENT PHYSICAL THERAPY TREATMENT NOTE   Patient Name: Julie Moran MRN: [id number] DOB:1956/11/09, 66 y.o., female Today's Date: 01/15/2022  PCP: Donnajean Lopes, MD REFERRING PROVIDER: Donnajean Lopes, MD   PT End of Session - 01/15/22 1435     Visit Number 6    Number of Visits 18    Date for PT Re-Evaluation 03/08/22    Authorization Type [employer]    PT Start Time 1430    PT Stop Time 1510    PT Time Calculation (min) 40 min    Activity Tolerance Patient tolerated treatment well    Behavior During Therapy WFL for tasks assessed/performed                 Past Medical History:  Diagnosis Date   Allergy    Atrophic vaginitis    Birt-Hogg-Dube syndrome    lung cysts    Birt-Hogg-Dube syndrome    Chest pain    Colon polyps    adenomatous   Constipation    Depression    Diverticulosis    Dry skin    Fatty liver    Floaters    Gall bladder disease    Glaucoma    Heart murmur    Herpes simplex    Hyperlipidemia    Hypertension    Joint pain    Lichen sclerosus    Pancreatitis    Pancreatitis    Pneumothorax, right    spontaneous   Shortness of breath    Shortness of breath on exertion    Swelling of both lower extremities    Trouble in sleeping    Past Surgical History:  Procedure Laterality Date   ABDOMINAL HYSTERECTOMY     BREAST BIOPSY  03/29/2006   BREAST BIOPSY Bilateral 06/14/1998   BREAST EXCISIONAL BIOPSY Bilateral 1999   BREAST LUMPECTOMY  1973,1992,2001   x5 times total , both breasts   CHOLECYSTECTOMY     COLONOSCOPY     2016   epidermoid cyst Right 02/2020   below Right breast   EXTERNAL FIXATION LEG Left 05/25/2021   Procedure: OPEN REDUCTION INTERNAL FIXATION  TIBIAL PLATEAU;  Surgeon: Shona Needles, MD;  Location: [place] OR;  Service: Orthopedics;  Laterality: Left;   hysterctomy     abdominal   POLYPECTOMY     right ovary removed     UPPER GASTROINTESTINAL ENDOSCOPY     Patient Active Problem List    Diagnosis Date Noted   Closed fracture of left tibial plateau 05/25/2021   Closed bicondylar fracture of left tibial plateau 05/24/2021   Stage 3a chronic kidney disease ([place]) 11/26/2019   Other hyperlipidemia 11/09/2019   Vitamin D deficiency 10/15/2018   Insulin resistance 10/15/2018   Fatty liver 10/15/2018   Other fatigue 07/03/2018   Shortness of breath on exertion 07/03/2018   Elevated LFTs 07/03/2018   Essential hypertension 07/03/2018   Dyslipidemia 09/17/2017   Benign essential HTN 09/17/2017   Acute infection of nasal sinus 09/17/2017   Atrophic vaginitis 09/17/2017   Family history of malignant neoplasm of breast 09/17/2017   Hereditary disease 09/17/2017   Herpes simplex 82/50/5397   Lichen sclerosus et atrophicus of the vulva 09/17/2017   Reduced libido 09/17/2017   Abnormal urination 08/10/2016   Obesity with body mass index 30 or greater 08/10/2016   Pneumothorax 09/24/2014   Benign melanoma 05/19/2014   Atheroma, skin 05/19/2014   D (diarrhea) 04/27/2013   Encounter for immunization 03/11/2013   Acute antritis 12/09/2012  Cutaneous eruption 11/27/2012   Inflamed nasal mucosa 11/27/2012   Depressive disorder 03/10/2012   Elevated fasting blood sugar 03/10/2012   Adiposity 03/10/2012   Extremity numbness 01/12/2011   Personal history of endocrine, metabolic, and immunity disorder 07/12/2010   Health examination of defined subpopulation 02/17/2010   Atypical chest pain 01/26/2010   Depression, major, single episode 01/26/2010   Polypharmacy 01/26/2010   Gall stone pancreatitis 01/26/2010   Abnormal weight gain 01/26/2010   Cough 12/13/2009    REFERRING PROVIDER: Corinne Ports, PA-C   REFERRING DIAG: Left bicondylar tibial plateau fx S/P ORIF 05/25/21   THERAPY DIAG:  Left knee pain, unspecified chronicity   Muscle weakness (generalized)   Stiffness of left knee, not elsewhere classified   Difficulty walking   ONSET DATE: 05/25/2021    SUBJECTIVE:    SUBJECTIVE STATEMENT: Pt reports no issues with newly added pool HEP. Pt was not sore after last session.    PERTINENT HISTORY: HTN   PAIN:  Are you having pain? No VAS scale:  No pain in L knee, 1/10 L lateral foot pain.   Pain location: anterior knee Pain orientation: Left  PAIN TYPE: dull Pain description: intermittent  Aggravating factors: walking, stairs, bending, standing too long,  Relieving factors: resting   PRECAUTIONS: None   WEIGHT BEARING RESTRICTIONS: Pt reports FWB at this time.  PLOF: Independent; walking; gardening; social events   PATIENT GOALS : Return to normal; improve strength; improve mobility     OBJECTIVE:    DIAGNOSTIC FINDINGS: none available in Epic; Pt presents video of MD stating bone is healed and hardware in place.      LE AROM/PROM:   A/PROM Right 12/08/2021 Left 12/08/2021 Left 01/01/2022 L  1/30  Hip flexion [facility] WFL    Hip extension 0 10    Hip internal rotation [facility] WFL    Hip external rotation WFL 40     Knee flexion WFL 98 107 deg 124  Knee extension 3 -11  -7 deg    (Blank rows = not tested)     TODAY'S TREATMENT:   122 flexion, 0 ext at end of session  Recumbent bike seated #6  -seated HS stretch 30s 3x -LAQ 5lbs -seated knee flexion and extension (attempted, too heavy with just machine) -RTB sidestepping 81ft x2 -Stair step flexion stretch 10s 10x -standing HS curl RTB 2x10     PATIENT EDUCATION:  Education details: anatomy, exercise progression, DOMS expectations, muscle firing,  envelope of function, HEP, POC  Person educated: Patient Education method: Explanation, Demonstration, Tactile cues, Verbal cues Education comprehension: verbalized understanding, returned demonstration, verbal cues required, tactile cues required, and needs further education     HOME EXERCISE PROGRAM: Access Code: [email or web address] URL: [email or web address] Date: 12/08/2021 Prepared by: Daleen Bo     ASSESSMENT:   CLINICAL IMPRESSION: Pt with good tolerance for increased intensity of loading of L LE at today's session. Pt also able to maintain previous levels of ROM without manual. Pt was able to incorporate gym based exercise into HEP. HEP frequency modified in order to promote adequate recovery. Plan to assess for response to new strengthening at next session. Pt likely able to decrease frequency if no adverse response to HEP. Patient should benefit from skilled PT to address above impairments and improve overall function.   Objective impairments include Abnormal gait, decreased activity tolerance, decreased balance, decreased endurance, decreased mobility, difficulty walking, decreased ROM, decreased strength, hypomobility, increased edema, increased fascial restrictions, increased muscle spasms, impaired flexibility,  improper body mechanics, postural dysfunction, and pain. These impairments are limiting patient from cleaning, community activity, driving, meal prep, laundry, yard work, shopping, and exercise . Personal factors including Age, Fitness, Time since onset of injury/illness/exacerbation, and 1-2 comorbidities:    are also affecting patient's functional outcome.     REHAB POTENTIAL: Fair     CLINICAL DECISION MAKING: Evolving/moderate complexity   EVALUATION COMPLEXITY: Moderate     GOALS:   SHORT TERM GOALS:   STG Name Target Date Goal status  1 Pt will become independent with HEP in order to demonstrate synthesis of PT education.   Baseline:  01/19/2022 INITIAL  2 Pt will be able to perform 5XSTS in under 12s  in order to demonstrate functional improvement above the cut off score for adults.   01/19/2022 INITIAL  3 Pt will be able to demonstrate/report ability to walk >5 mins without pain in order to demonstrate functional improvement and tolerance to exercise and community mobility.   Baseline: 01/19/2022 INITIAL    LONG TERM GOALS:    LTG Name Target Date Goal  status  1 Pt  will become independent with final HEP in order to demonstrate synthesis of PT education.   Baseline: 03/02/2022 INITIAL  2 Pt will be able to demonstrate reciprocal stair stepping with UE in order to demonstrate functional improvement in LE function for self-care and house hold duties.   03/02/2022 INITIAL  3 Pt will be able to demonstrate normal gait without AD in order to demonstrate functional improvement in LE function for self-care and house hold duties.   Baseline: 03/02/2022 INITIAL  4 Pt will be able to demonstrate/report ability to walk >15 mins without pain in order to demonstrate functional improvement and tolerance to exercise and community mobility.   Baseline: 03/02/2022 INITIAL    PLAN: PT FREQUENCY: 1-2x/week   PT DURATION: 12 weeks   PLANNED INTERVENTIONS: Therapeutic exercises, Therapeutic activity, Neuro Muscular re-education, Balance training, Gait training, Patient/Family education, Joint mobilization, Stair training, DME instructions, Aquatic Therapy, Dry Needling, Electrical stimulation, Spinal mobilization, Cryotherapy, Moist heat, scar mobilization, Taping, Vasopneumatic device, Traction, Ultrasound, Ionotophoresis 4mg /ml Dexamethasone, and Manual therapy   PLAN FOR NEXT SESSION: review and perform HEP.   Cont with therapeutic exercise to improve knee flexion and extension ROM and hip and quad strength   Daleen Bo PT, DPT 01/15/22 3:38 PM

## 2022-01-18 ENCOUNTER — Ambulatory Visit (HOSPITAL_BASED_OUTPATIENT_CLINIC_OR_DEPARTMENT_OTHER): Payer: Medicare PPO | Admitting: Physical Therapy

## 2022-01-18 ENCOUNTER — Other Ambulatory Visit: Payer: Self-pay

## 2022-01-18 ENCOUNTER — Encounter (HOSPITAL_BASED_OUTPATIENT_CLINIC_OR_DEPARTMENT_OTHER): Payer: Self-pay | Admitting: Physical Therapy

## 2022-01-18 DIAGNOSIS — M25662 Stiffness of left knee, not elsewhere classified: Secondary | ICD-10-CM

## 2022-01-18 DIAGNOSIS — M25562 Pain in left knee: Secondary | ICD-10-CM | POA: Diagnosis not present

## 2022-01-18 DIAGNOSIS — R262 Difficulty in walking, not elsewhere classified: Secondary | ICD-10-CM | POA: Diagnosis not present

## 2022-01-18 DIAGNOSIS — M6281 Muscle weakness (generalized): Secondary | ICD-10-CM | POA: Diagnosis not present

## 2022-01-18 NOTE — Therapy (Signed)
OUTPATIENT PHYSICAL THERAPY TREATMENT NOTE   Patient Name: Julie Moran MRN: [id number] DOB:14-Nov-1956, 66 y.o., female Today's Date: 01/18/2022  PCP: Donnajean Lopes, MD REFERRING PROVIDER: Donnajean Lopes, MD   PT End of Session - 01/18/22 1401     Visit Number 7    Number of Visits 18    Date for PT Re-Evaluation 03/08/22    Authorization Type [employer]    PT Start Time 4128    PT Stop Time 1425    PT Time Calculation (min) 40 min    Activity Tolerance Patient tolerated treatment well    Behavior During Therapy WFL for tasks assessed/performed                  Past Medical History:  Diagnosis Date   Allergy    Atrophic vaginitis    Birt-Hogg-Dube syndrome    lung cysts    Birt-Hogg-Dube syndrome    Chest pain    Colon polyps    adenomatous   Constipation    Depression    Diverticulosis    Dry skin    Fatty liver    Floaters    Gall bladder disease    Glaucoma    Heart murmur    Herpes simplex    Hyperlipidemia    Hypertension    Joint pain    Lichen sclerosus    Pancreatitis    Pancreatitis    Pneumothorax, right    spontaneous   Shortness of breath    Shortness of breath on exertion    Swelling of both lower extremities    Trouble in sleeping    Past Surgical History:  Procedure Laterality Date   ABDOMINAL HYSTERECTOMY     BREAST BIOPSY  03/29/2006   BREAST BIOPSY Bilateral 06/14/1998   BREAST EXCISIONAL BIOPSY Bilateral 1999   BREAST LUMPECTOMY  1973,1992,2001   x5 times total , both breasts   CHOLECYSTECTOMY     COLONOSCOPY     2016   epidermoid cyst Right 02/2020   below Right breast   EXTERNAL FIXATION LEG Left 05/25/2021   Procedure: OPEN REDUCTION INTERNAL FIXATION  TIBIAL PLATEAU;  Surgeon: Shona Needles, MD;  Location: [facility];  Service: Orthopedics;  Laterality: Left;   hysterctomy     abdominal   POLYPECTOMY     right ovary removed     UPPER GASTROINTESTINAL ENDOSCOPY     Patient Active Problem List    Diagnosis Date Noted   Closed fracture of left tibial plateau 05/25/2021   Closed bicondylar fracture of left tibial plateau 05/24/2021   Stage 3a chronic kidney disease ([place]) 11/26/2019   Other hyperlipidemia 11/09/2019   Vitamin D deficiency 10/15/2018   Insulin resistance 10/15/2018   Fatty liver 10/15/2018   Other fatigue 07/03/2018   Shortness of breath on exertion 07/03/2018   Elevated LFTs 07/03/2018   Essential hypertension 07/03/2018   Dyslipidemia 09/17/2017   Benign essential HTN 09/17/2017   Acute infection of nasal sinus 09/17/2017   Atrophic vaginitis 09/17/2017   Family history of malignant neoplasm of breast 09/17/2017   Hereditary disease 09/17/2017   Herpes simplex 78/67/6720   Lichen sclerosus et atrophicus of the vulva 09/17/2017   Reduced libido 09/17/2017   Abnormal urination 08/10/2016   Obesity with body mass index 30 or greater 08/10/2016   Pneumothorax 09/24/2014   Benign melanoma 05/19/2014   Atheroma, skin 05/19/2014   D (diarrhea) 04/27/2013   Encounter for immunization 03/11/2013   Acute antritis  12/09/2012   Cutaneous eruption 11/27/2012   Inflamed nasal mucosa 11/27/2012   Depressive disorder 03/10/2012   Elevated fasting blood sugar 03/10/2012   Adiposity 03/10/2012   Extremity numbness 01/12/2011   Personal history of endocrine, metabolic, and immunity disorder 07/12/2010   Health examination of defined subpopulation 02/17/2010   Atypical chest pain 01/26/2010   Depression, major, single episode 01/26/2010   Polypharmacy 01/26/2010   Gall stone pancreatitis 01/26/2010   Abnormal weight gain 01/26/2010   Cough 12/13/2009    REFERRING PROVIDER: Corinne Ports, PA-C   REFERRING DIAG: Left bicondylar tibial plateau fx S/P ORIF 05/25/21   THERAPY DIAG:  Left knee pain, unspecified chronicity   Muscle weakness (generalized)   Stiffness of left knee, not elsewhere classified   Difficulty walking   ONSET DATE: 05/25/2021    SUBJECTIVE:    SUBJECTIVE STATEMENT: Pt states she was tired after last session but she does feel she has problem with the top of her foot.    PERTINENT HISTORY: HTN   PAIN:  Are you having pain? No VAS scale:  No pain in L knee, 1/10 L lateral foot pain.   Pain location: anterior knee Pain orientation: Left  PAIN TYPE: dull Pain description: intermittent  Aggravating factors: walking, stairs, bending, standing too long,  Relieving factors: resting   PRECAUTIONS: None   WEIGHT BEARING RESTRICTIONS: Pt reports FWB at this time.  PLOF: Independent; walking; gardening; social events   PATIENT GOALS : Return to normal; improve strength; improve mobility     OBJECTIVE:    DIAGNOSTIC FINDINGS: none available in Epic; Pt presents video of MD stating bone is healed and hardware in place.      LE AROM/PROM:   A/PROM Right 12/08/2021 Left 12/08/2021 Left 01/01/2022 L  1/30  Hip flexion [facility] WFL    Hip extension 0 10    Hip internal rotation [facility] WFL    Hip external rotation WFL 40     Knee flexion WFL 98 107 deg 124  Knee extension 3 -11  -7 deg    (Blank rows = not tested)    No tenderness along popliteal space, swelling present at baseline, no calf tenderness, no erythema- pain relieved with HS stretch   TODAY'S TREATMENT:   122 flexion, 0 ext at end of session  Recumbent bike seated #6  white leg press 2x10 55lbs -seated HS stretch 30s 3x -LAQ 5lbs -RTB sidestepping 28ft x2 -Stair step flexion stretch 10s 10x -standing HS curl RTB 2x10 -fwd and lateral step up 4" box 15x     PATIENT EDUCATION:  Education details: DVT s/s, need for immediate medical attention if worsening symptoms, anatomy, exercise progression, DOMS expectations, muscle firing,  envelope of function, HEP, POC  Person educated: Patient Education method: Explanation, Demonstration, Tactile cues, Verbal cues Education comprehension: verbalized understanding, returned demonstration, verbal  cues required, tactile cues required, and needs further education     HOME EXERCISE PROGRAM: Access Code: [email or web address] URL: [email or web address] Date: 12/08/2021 Prepared by: Daleen Bo    ASSESSMENT:   CLINICAL IMPRESSION: Pt presents today with L HS pain at beginning of today's session while on exercise bike. Clinical exam suggests HS irritation. Given's pt's recent doppler, usage of compression hose, and self awareness, pt has good understanding of seeking medical attention in case the case of DVT. Pt given thorough edu about s/s to monitor and immediacy of seeking medical attention, if needed. Pt gave verbal understanding. Pt did have good tolerance to repeat of previous  session and able to begin taper of PT frequency as she has resources at well Patient should benefit from skilled PT to address above impairments and improve overall function.   Objective impairments include Abnormal gait, decreased activity tolerance, decreased balance, decreased endurance, decreased mobility, difficulty walking, decreased ROM, decreased strength, hypomobility, increased edema, increased fascial restrictions, increased muscle spasms, impaired flexibility, improper body mechanics, postural dysfunction, and pain. These impairments are limiting patient from cleaning, community activity, driving, meal prep, laundry, yard work, shopping, and exercise . Personal factors including Age, Fitness, Time since onset of injury/illness/exacerbation, and 1-2 comorbidities:    are also affecting patient's functional outcome.     REHAB POTENTIAL: Fair     CLINICAL DECISION MAKING: Evolving/moderate complexity   EVALUATION COMPLEXITY: Moderate     GOALS:   SHORT TERM GOALS:   STG Name Target Date Goal status  1 Pt will become independent with HEP in order to demonstrate synthesis of PT education.   Baseline:  01/19/2022 INITIAL  2 Pt will be able to perform 5XSTS in under 12s  in order to demonstrate  functional improvement above the cut off score for adults.   01/19/2022 INITIAL  3 Pt will be able to demonstrate/report ability to walk >5 mins without pain in order to demonstrate functional improvement and tolerance to exercise and community mobility.   Baseline: 01/19/2022 INITIAL    LONG TERM GOALS:    LTG Name Target Date Goal status  1 Pt  will become independent with final HEP in order to demonstrate synthesis of PT education.   Baseline: 03/02/2022 INITIAL  2 Pt will be able to demonstrate reciprocal stair stepping with UE in order to demonstrate functional improvement in LE function for self-care and house hold duties.   03/02/2022 INITIAL  3 Pt will be able to demonstrate normal gait without AD in order to demonstrate functional improvement in LE function for self-care and house hold duties.   Baseline: 03/02/2022 INITIAL  4 Pt will be able to demonstrate/report ability to walk >15 mins without pain in order to demonstrate functional improvement and tolerance to exercise and community mobility.   Baseline: 03/02/2022 INITIAL    PLAN: PT FREQUENCY: 1-2x/week   PT DURATION: 12 weeks   PLANNED INTERVENTIONS: Therapeutic exercises, Therapeutic activity, Neuro Muscular re-education, Balance training, Gait training, Patient/Family education, Joint mobilization, Stair training, DME instructions, Aquatic Therapy, Dry Needling, Electrical stimulation, Spinal mobilization, Cryotherapy, Moist heat, scar mobilization, Taping, Vasopneumatic device, Traction, Ultrasound, Ionotophoresis 4mg /ml Dexamethasone, and Manual therapy   PLAN FOR NEXT SESSION: review and perform HEP.   Cont with therapeutic exercise to improve knee flexion and extension ROM and hip and quad strength   Daleen Bo PT, DPT 01/18/22 2:30 PM

## 2022-01-22 ENCOUNTER — Other Ambulatory Visit: Payer: Self-pay

## 2022-01-22 ENCOUNTER — Ambulatory Visit (HOSPITAL_BASED_OUTPATIENT_CLINIC_OR_DEPARTMENT_OTHER): Payer: Medicare PPO | Admitting: Physical Therapy

## 2022-01-22 ENCOUNTER — Encounter (HOSPITAL_BASED_OUTPATIENT_CLINIC_OR_DEPARTMENT_OTHER): Payer: Self-pay | Admitting: Physical Therapy

## 2022-01-22 DIAGNOSIS — M25662 Stiffness of left knee, not elsewhere classified: Secondary | ICD-10-CM | POA: Diagnosis not present

## 2022-01-22 DIAGNOSIS — R262 Difficulty in walking, not elsewhere classified: Secondary | ICD-10-CM

## 2022-01-22 DIAGNOSIS — M6281 Muscle weakness (generalized): Secondary | ICD-10-CM | POA: Diagnosis not present

## 2022-01-22 DIAGNOSIS — M25562 Pain in left knee: Secondary | ICD-10-CM | POA: Diagnosis not present

## 2022-01-22 NOTE — Therapy (Signed)
OUTPATIENT PHYSICAL THERAPY TREATMENT NOTE   Patient Name: Julie Moran MRN: [id number] DOB:November 21, 1956, 66 y.o., female Today's Date: 01/22/2022  PCP: Donnajean Lopes, MD REFERRING PROVIDER: Donnajean Lopes, MD   PT End of Session - 01/22/22 1418     Visit Number 8    Number of Visits 18    Date for PT Re-Evaluation 03/08/22    Authorization Type [employer]    PT Start Time 1430    PT Stop Time 1510    PT Time Calculation (min) 40 min    Activity Tolerance Patient tolerated treatment well    Behavior During Therapy WFL for tasks assessed/performed                   Past Medical History:  Diagnosis Date   Allergy    Atrophic vaginitis    Birt-Hogg-Dube syndrome    lung cysts    Birt-Hogg-Dube syndrome    Chest pain    Colon polyps    adenomatous   Constipation    Depression    Diverticulosis    Dry skin    Fatty liver    Floaters    Gall bladder disease    Glaucoma    Heart murmur    Herpes simplex    Hyperlipidemia    Hypertension    Joint pain    Lichen sclerosus    Pancreatitis    Pancreatitis    Pneumothorax, right    spontaneous   Shortness of breath    Shortness of breath on exertion    Swelling of both lower extremities    Trouble in sleeping    Past Surgical History:  Procedure Laterality Date   ABDOMINAL HYSTERECTOMY     BREAST BIOPSY  03/29/2006   BREAST BIOPSY Bilateral 06/14/1998   BREAST EXCISIONAL BIOPSY Bilateral 1999   BREAST LUMPECTOMY  1973,1992,2001   x5 times total , both breasts   CHOLECYSTECTOMY     COLONOSCOPY     2016   epidermoid cyst Right 02/2020   below Right breast   EXTERNAL FIXATION LEG Left 05/25/2021   Procedure: OPEN REDUCTION INTERNAL FIXATION  TIBIAL PLATEAU;  Surgeon: Shona Needles, MD;  Location: [facility];  Service: Orthopedics;  Laterality: Left;   hysterctomy     abdominal   POLYPECTOMY     right ovary removed     UPPER GASTROINTESTINAL ENDOSCOPY     Patient Active Problem  List   Diagnosis Date Noted   Closed fracture of left tibial plateau 05/25/2021   Closed bicondylar fracture of left tibial plateau 05/24/2021   Stage 3a chronic kidney disease ([place]) 11/26/2019   Other hyperlipidemia 11/09/2019   Vitamin D deficiency 10/15/2018   Insulin resistance 10/15/2018   Fatty liver 10/15/2018   Other fatigue 07/03/2018   Shortness of breath on exertion 07/03/2018   Elevated LFTs 07/03/2018   Essential hypertension 07/03/2018   Dyslipidemia 09/17/2017   Benign essential HTN 09/17/2017   Acute infection of nasal sinus 09/17/2017   Atrophic vaginitis 09/17/2017   Family history of malignant neoplasm of breast 09/17/2017   Hereditary disease 09/17/2017   Herpes simplex 26/20/3559   Lichen sclerosus et atrophicus of the vulva 09/17/2017   Reduced libido 09/17/2017   Abnormal urination 08/10/2016   Obesity with body mass index 30 or greater 08/10/2016   Pneumothorax 09/24/2014   Benign melanoma 05/19/2014   Atheroma, skin 05/19/2014   D (diarrhea) 04/27/2013   Encounter for immunization 03/11/2013   Acute  antritis 12/09/2012   Cutaneous eruption 11/27/2012   Inflamed nasal mucosa 11/27/2012   Depressive disorder 03/10/2012   Elevated fasting blood sugar 03/10/2012   Adiposity 03/10/2012   Extremity numbness 01/12/2011   Personal history of endocrine, metabolic, and immunity disorder 07/12/2010   Health examination of defined subpopulation 02/17/2010   Atypical chest pain 01/26/2010   Depression, major, single episode 01/26/2010   Polypharmacy 01/26/2010   Gall stone pancreatitis 01/26/2010   Abnormal weight gain 01/26/2010   Cough 12/13/2009    REFERRING PROVIDER: Corinne Ports, PA-C   REFERRING DIAG: Left bicondylar tibial plateau fx S/P ORIF 05/25/21   THERAPY DIAG:  Left knee pain, unspecified chronicity   Muscle weakness (generalized)   Stiffness of left knee, not elsewhere classified   Difficulty walking   ONSET DATE: 05/25/2021    SUBJECTIVE:    SUBJECTIVE STATEMENT: Pt states she was tired after last session but she does feel she has problem with the top of her foot.    PERTINENT HISTORY: HTN   PAIN:  Are you having pain? No VAS scale:  No pain in L knee, 1/10 L lateral foot pain.   Pain location: anterior knee Pain orientation: Left  PAIN TYPE: dull Pain description: intermittent  Aggravating factors: walking, stairs, bending, standing too long,  Relieving factors: resting   PRECAUTIONS: None   WEIGHT BEARING RESTRICTIONS: Pt reports FWB at this time.  PLOF: Independent; walking; gardening; social events   PATIENT GOALS : Return to normal; improve strength; improve mobility     OBJECTIVE:    DIAGNOSTIC FINDINGS: none available in Epic; Pt presents video of MD stating bone is healed and hardware in place.      LE AROM/PROM:   A/PROM Right 12/08/2021 Left 12/08/2021 Left 01/01/2022 L  1/30  Hip flexion [facility] WFL    Hip extension 0 10    Hip internal rotation [facility] WFL    Hip external rotation WFL 40     Knee flexion WFL 98 107 deg 124  Knee extension 3 -11  -7 deg    (Blank rows = not tested)      TODAY'S TREATMENT:  Recumbent bike seated #6- L2.5 5mn  -white leg press 2x10 60lbs; 50lbs 10x warm up -seated HS stretch 30s 3x -standing HS curl 3lb 3x10 -fwd and lateral step up 4" box 15x -SL balance 10s 5x -tandem stance (L in back) with and without cog challenge 30s 4x -Standing HR staggered stance 3x10     PATIENT EDUCATION:  Education details: anatomy, exercise progression, DOMS expectations, muscle firing,  envelope of function, HEP, POC  Person educated: Patient Education method: Explanation, Demonstration, Tactile cues, Verbal cues Education comprehension: verbalized understanding, returned demonstration, verbal cues required, tactile cues required, and needs further education     HOME EXERCISE PROGRAM: Access Code: [email or web address] URL:  [email or web address] Date: 12/08/2021 Prepared by: ADaleen Bo   ASSESSMENT:   CLINICAL IMPRESSION: Pt able to continue with progression of exercise loading intensity and volume of standing exercise at today's session. Pt has improved functional flexion and ext ROM for stair stepping, sitting, and general ambulation tasks but has endurance deficits. SLS time limited by fatigue rather than balance issues. Plan to continue with progression of HEP and increasing volume of loading at next session. Plan for trial of lunging if no increase in pain today. Patient should benefit from skilled PT to address above impairments and improve overall function.   Objective impairments include Abnormal gait, decreased activity tolerance, decreased  balance, decreased endurance, decreased mobility, difficulty walking, decreased ROM, decreased strength, hypomobility, increased edema, increased fascial restrictions, increased muscle spasms, impaired flexibility, improper body mechanics, postural dysfunction, and pain. These impairments are limiting patient from cleaning, community activity, driving, meal prep, laundry, yard work, shopping, and exercise . Personal factors including Age, Fitness, Time since onset of injury/illness/exacerbation, and 1-2 comorbidities:    are also affecting patient's functional outcome.     REHAB POTENTIAL: Fair     CLINICAL DECISION MAKING: Evolving/moderate complexity   EVALUATION COMPLEXITY: Moderate     GOALS:   SHORT TERM GOALS:   STG Name Target Date Goal status  1 Pt will become independent with HEP in order to demonstrate synthesis of PT education.   Baseline:  01/19/2022 MET  2 Pt will be able to perform 5XSTS in under 12s  in order to demonstrate functional improvement above the cut off score for adults.   01/19/2022 ongoing  3 Pt will be able to demonstrate/report ability to walk >5 mins without pain in order to demonstrate functional improvement and  tolerance to exercise and community mobility.   Baseline: 01/19/2022 ongoing    LONG TERM GOALS:    LTG Name Target Date Goal status  1 Pt  will become independent with final HEP in order to demonstrate synthesis of PT education.   Baseline: 03/02/2022 ongoing  2 Pt will be able to demonstrate reciprocal stair stepping with UE in order to demonstrate functional improvement in LE function for self-care and house hold duties.   03/02/2022 ongoing  3 Pt will be able to demonstrate normal gait without AD in order to demonstrate functional improvement in LE function for self-care and house hold duties.   Baseline: 03/02/2022 Met  4 Pt will be able to demonstrate/report ability to walk >15 mins without pain in order to demonstrate functional improvement and tolerance to exercise and community mobility.   Baseline: 03/02/2022 ongoing    PLAN: PT FREQUENCY: 1-2x/week   PT DURATION: 12 weeks   PLANNED INTERVENTIONS: Therapeutic exercises, Therapeutic activity, Neuro Muscular re-education, Balance training, Gait training, Patient/Family education, Joint mobilization, Stair training, DME instructions, Aquatic Therapy, Dry Needling, Electrical stimulation, Spinal mobilization, Cryotherapy, Moist heat, scar mobilization, Taping, Vasopneumatic device, Traction, Ultrasound, Ionotophoresis 76m/ml Dexamethasone, and Manual therapy   PLAN FOR NEXT SESSION: review and perform HEP.   Cont with therapeutic exercise to improve knee flexion and extension ROM and hip and quad strength   ADaleen BoPT, DPT 01/22/22 3:21 PM

## 2022-01-23 DIAGNOSIS — S82142D Displaced bicondylar fracture of left tibia, subsequent encounter for closed fracture with routine healing: Secondary | ICD-10-CM | POA: Diagnosis not present

## 2022-01-25 ENCOUNTER — Encounter (HOSPITAL_BASED_OUTPATIENT_CLINIC_OR_DEPARTMENT_OTHER): Payer: Medicare PPO | Admitting: Physical Therapy

## 2022-01-29 ENCOUNTER — Ambulatory Visit (HOSPITAL_BASED_OUTPATIENT_CLINIC_OR_DEPARTMENT_OTHER): Payer: Medicare PPO | Admitting: Physical Therapy

## 2022-01-30 DIAGNOSIS — S82142D Displaced bicondylar fracture of left tibia, subsequent encounter for closed fracture with routine healing: Secondary | ICD-10-CM | POA: Diagnosis not present

## 2022-01-30 DIAGNOSIS — S82152D Displaced fracture of left tibial tuberosity, subsequent encounter for closed fracture with routine healing: Secondary | ICD-10-CM | POA: Diagnosis not present

## 2022-02-01 ENCOUNTER — Encounter (HOSPITAL_BASED_OUTPATIENT_CLINIC_OR_DEPARTMENT_OTHER): Payer: Medicare PPO | Admitting: Physical Therapy

## 2022-02-05 ENCOUNTER — Other Ambulatory Visit (HOSPITAL_BASED_OUTPATIENT_CLINIC_OR_DEPARTMENT_OTHER): Payer: Self-pay | Admitting: Student

## 2022-02-05 ENCOUNTER — Other Ambulatory Visit: Payer: Self-pay

## 2022-02-05 ENCOUNTER — Encounter (HOSPITAL_BASED_OUTPATIENT_CLINIC_OR_DEPARTMENT_OTHER): Payer: Self-pay | Admitting: Physical Therapy

## 2022-02-05 ENCOUNTER — Ambulatory Visit (HOSPITAL_BASED_OUTPATIENT_CLINIC_OR_DEPARTMENT_OTHER): Payer: Medicare PPO | Admitting: Physical Therapy

## 2022-02-05 DIAGNOSIS — M25562 Pain in left knee: Secondary | ICD-10-CM | POA: Diagnosis not present

## 2022-02-05 DIAGNOSIS — M6281 Muscle weakness (generalized): Secondary | ICD-10-CM

## 2022-02-05 DIAGNOSIS — R262 Difficulty in walking, not elsewhere classified: Secondary | ICD-10-CM | POA: Diagnosis not present

## 2022-02-05 DIAGNOSIS — S83204D Other tear of unspecified meniscus, current injury, left knee, subsequent encounter: Secondary | ICD-10-CM

## 2022-02-05 DIAGNOSIS — M25662 Stiffness of left knee, not elsewhere classified: Secondary | ICD-10-CM | POA: Diagnosis not present

## 2022-02-05 NOTE — Therapy (Signed)
OUTPATIENT PHYSICAL THERAPY TREATMENT NOTE   Patient Name: Julie Moran MRN: [id number] DOB:1956-03-21, 66 y.o., female Today's Date: 02/05/2022  PCP: Donnajean Lopes, MD REFERRING PROVIDER: Donnajean Lopes, MD   PT End of Session - 02/05/22 1435     Visit Number 9    Number of Visits 18    Date for PT Re-Evaluation 03/08/22    Authorization Type [employer]    PT Start Time 6195    PT Stop Time 1500    PT Time Calculation (min) 29 min    Activity Tolerance Patient tolerated treatment well    Behavior During Therapy WFL for tasks assessed/performed                   Past Medical History:  Diagnosis Date   Allergy    Atrophic vaginitis    Birt-Hogg-Dube syndrome    lung cysts    Birt-Hogg-Dube syndrome    Chest pain    Colon polyps    adenomatous   Constipation    Depression    Diverticulosis    Dry skin    Fatty liver    Floaters    Gall bladder disease    Glaucoma    Heart murmur    Herpes simplex    Hyperlipidemia    Hypertension    Joint pain    Lichen sclerosus    Pancreatitis    Pancreatitis    Pneumothorax, right    spontaneous   Shortness of breath    Shortness of breath on exertion    Swelling of both lower extremities    Trouble in sleeping    Past Surgical History:  Procedure Laterality Date   ABDOMINAL HYSTERECTOMY     BREAST BIOPSY  03/29/2006   BREAST BIOPSY Bilateral 06/14/1998   BREAST EXCISIONAL BIOPSY Bilateral 1999   BREAST LUMPECTOMY  1973,1992,2001   x5 times total , both breasts   CHOLECYSTECTOMY     COLONOSCOPY     2016   epidermoid cyst Right 02/2020   below Right breast   EXTERNAL FIXATION LEG Left 05/25/2021   Procedure: OPEN REDUCTION INTERNAL FIXATION  TIBIAL PLATEAU;  Surgeon: Shona Needles, MD;  Location: [facility];  Service: Orthopedics;  Laterality: Left;   hysterctomy     abdominal   POLYPECTOMY     right ovary removed     UPPER GASTROINTESTINAL ENDOSCOPY     Patient Active Problem  List   Diagnosis Date Noted   Closed fracture of left tibial plateau 05/25/2021   Closed bicondylar fracture of left tibial plateau 05/24/2021   Stage 3a chronic kidney disease ([place]) 11/26/2019   Other hyperlipidemia 11/09/2019   Vitamin D deficiency 10/15/2018   Insulin resistance 10/15/2018   Fatty liver 10/15/2018   Other fatigue 07/03/2018   Shortness of breath on exertion 07/03/2018   Elevated LFTs 07/03/2018   Essential hypertension 07/03/2018   Dyslipidemia 09/17/2017   Benign essential HTN 09/17/2017   Acute infection of nasal sinus 09/17/2017   Atrophic vaginitis 09/17/2017   Family history of malignant neoplasm of breast 09/17/2017   Hereditary disease 09/17/2017   Herpes simplex 09/32/6712   Lichen sclerosus et atrophicus of the vulva 09/17/2017   Reduced libido 09/17/2017   Abnormal urination 08/10/2016   Obesity with body mass index 30 or greater 08/10/2016   Pneumothorax 09/24/2014   Benign melanoma 05/19/2014   Atheroma, skin 05/19/2014   D (diarrhea) 04/27/2013   Encounter for immunization 03/11/2013   Acute  antritis 12/09/2012   Cutaneous eruption 11/27/2012   Inflamed nasal mucosa 11/27/2012   Depressive disorder 03/10/2012   Elevated fasting blood sugar 03/10/2012   Adiposity 03/10/2012   Extremity numbness 01/12/2011   Personal history of endocrine, metabolic, and immunity disorder 07/12/2010   Health examination of defined subpopulation 02/17/2010   Atypical chest pain 01/26/2010   Depression, major, single episode 01/26/2010   Polypharmacy 01/26/2010   Gall stone pancreatitis 01/26/2010   Abnormal weight gain 01/26/2010   Cough 12/13/2009    REFERRING PROVIDER: Corinne Ports, PA-C   REFERRING DIAG: Left bicondylar tibial plateau fx S/P ORIF 05/25/21   THERAPY DIAG:  Left knee pain, unspecified chronicity   Muscle weakness (generalized)   Stiffness of left knee, not elsewhere classified   Difficulty walking   ONSET DATE: 05/25/2021    SUBJECTIVE:    SUBJECTIVE STATEMENT: Pt states that after last session, she was continuing with the rehab at home. She was alternating every other day. Then on Monday, she states she was not able to walk and unable to bear weight due to the pain. She feels like something "shifted." She states it feels better today. Pt states she has seen MD and MRI was ordered.   Pt denies feelings of catching, locking in the L knee.    PERTINENT HISTORY: HTN   PAIN:  Are you having pain? No VAS scale:  No pain in L knee, 1/10 L lateral foot pain.   Pain location: anterior knee Pain orientation: Left  PAIN TYPE: dull Pain description: intermittent  Aggravating factors: walking, stairs, bending, standing too long,  Relieving factors: resting   PRECAUTIONS: None   WEIGHT BEARING RESTRICTIONS: Pt reports FWB at this time.  PLOF: Independent; walking; gardening; social events   PATIENT GOALS : Return to normal; improve strength; improve mobility     OBJECTIVE:   2/27 (-) varus valgus at 0 and 20 (-) Post drawer (-) Lachman's (-) [place]ray  (+) L joint line tenderness  L knee flexion 120 Ext -1 deg   DIAGNOSTIC FINDINGS: none available in Epic;  Pt reports that latest X-ray showed normal spacing.    LE AROM/PROM:   A/PROM Right 12/08/2021 Left 12/08/2021 Left 01/01/2022 L  1/30  Hip flexion [facility] WFL    Hip extension 0 10    Hip internal rotation [facility] WFL    Hip external rotation WFL 40     Knee flexion WFL 98 107 deg 124  Knee extension 3 -11  -7 deg    (Blank rows = not tested)      TODAY'S TREATMENT:   2/27   Heel slide 10s 10x  Seated LAQ 3x10  Standing HR 20x   HEP frequency, regressions, aquatic exercise   Previous: Recumbent bike seated #6- L2.5 47mn  -white leg press 2x10 60lbs; 50lbs 10x warm up -seated HS stretch 30s 3x -standing HS curl 3lb 3x10 -fwd and lateral step up 4" box 15x -SL balance 10s 5x -tandem stance (L in back) with and without cog  challenge 30s 4x -Standing HR staggered stance 3x10     PATIENT EDUCATION:  Education details: testing results,  anatomy, envelope of function, HEP, POC  Person educated: Patient Education method: Explanation, Demonstration, Tactile cues, Verbal cues Education comprehension: verbalized understanding, returned demonstration, verbal cues required, tactile cues required, and needs further education     HOME EXERCISE PROGRAM: Access Code: [email or web address] URL: [email or web address] Date: 12/08/2021 Prepared by: ADaleen Bo   ASSESSMENT:   CLINICAL IMPRESSION:  Pt requests to put PT on hold she is able to obtain MRI results. Clinical testing does not suggest internal derangement at this time. ROM is maintain but pt does have increased discomfort during knee flexion based exercise. Pt's recent pain exacerbation does appear consistent with recent increase in volume of loading. Pt given edu about exam results and likelihood of volume increase causing more discomfort. However, pt reports feeling more comfortable once she obtains further imaging. Plan to continue with PT following MRI results. Patient should benefit from skilled PT to address above impairments and improve overall function.   Objective impairments include Abnormal gait, decreased activity tolerance, decreased balance, decreased endurance, decreased mobility, difficulty walking, decreased ROM, decreased strength, hypomobility, increased edema, increased fascial restrictions, increased muscle spasms, impaired flexibility, improper body mechanics, postural dysfunction, and pain. These impairments are limiting patient from cleaning, community activity, driving, meal prep, laundry, yard work, shopping, and exercise . Personal factors including Age, Fitness, Time since onset of injury/illness/exacerbation, and 1-2 comorbidities:    are also affecting patient's functional outcome.     REHAB POTENTIAL: Fair     CLINICAL DECISION MAKING:  Evolving/moderate complexity   EVALUATION COMPLEXITY: Moderate     GOALS:   SHORT TERM GOALS:   STG Name Target Date Goal status  1 Pt will become independent with HEP in order to demonstrate synthesis of PT education.   Baseline:  01/19/2022 MET  2 Pt will be able to perform 5XSTS in under 12s  in order to demonstrate functional improvement above the cut off score for adults.   01/19/2022 ongoing  3 Pt will be able to demonstrate/report ability to walk >5 mins without pain in order to demonstrate functional improvement and tolerance to exercise and community mobility.   Baseline: 01/19/2022 ongoing    LONG TERM GOALS:    LTG Name Target Date Goal status  1 Pt  will become independent with final HEP in order to demonstrate synthesis of PT education.   Baseline: 03/02/2022 ongoing  2 Pt will be able to demonstrate reciprocal stair stepping with UE in order to demonstrate functional improvement in LE function for self-care and house hold duties.   03/02/2022 ongoing  3 Pt will be able to demonstrate normal gait without AD in order to demonstrate functional improvement in LE function for self-care and house hold duties.   Baseline: 03/02/2022 Met  4 Pt will be able to demonstrate/report ability to walk >15 mins without pain in order to demonstrate functional improvement and tolerance to exercise and community mobility.   Baseline: 03/02/2022 ongoing    PLAN: PT FREQUENCY: 1-2x/week   PT DURATION: 12 weeks   PLANNED INTERVENTIONS: Therapeutic exercises, Therapeutic activity, Neuro Muscular re-education, Balance training, Gait training, Patient/Family education, Joint mobilization, Stair training, DME instructions, Aquatic Therapy, Dry Needling, Electrical stimulation, Spinal mobilization, Cryotherapy, Moist heat, scar mobilization, Taping, Vasopneumatic device, Traction, Ultrasound, Ionotophoresis 49m/ml Dexamethasone, and Manual therapy   PLAN FOR NEXT SESSION: review and perform HEP.    Cont with therapeutic exercise to improve knee flexion and extension ROM and hip and quad strength   ADaleen BoPT, DPT 02/05/22 3:06 PM

## 2022-02-09 ENCOUNTER — Other Ambulatory Visit: Payer: Self-pay

## 2022-02-09 ENCOUNTER — Ambulatory Visit (HOSPITAL_BASED_OUTPATIENT_CLINIC_OR_DEPARTMENT_OTHER)
Admission: RE | Admit: 2022-02-09 | Discharge: 2022-02-09 | Disposition: A | Discharge status: Home / Self Care | Payer: Medicare PPO | Source: Ambulatory Visit | Attending: Student | Admitting: Student

## 2022-02-09 DIAGNOSIS — M25562 Pain in left knee: Secondary | ICD-10-CM | POA: Diagnosis not present

## 2022-02-09 DIAGNOSIS — S83204D Other tear of unspecified meniscus, current injury, left knee, subsequent encounter: Secondary | ICD-10-CM | POA: Insufficient documentation

## 2022-02-12 DIAGNOSIS — F4329 Adjustment disorder with other symptoms: Secondary | ICD-10-CM | POA: Diagnosis not present

## 2022-03-08 DIAGNOSIS — M25562 Pain in left knee: Secondary | ICD-10-CM | POA: Diagnosis not present

## 2022-03-08 DIAGNOSIS — S83262A Peripheral tear of lateral meniscus, current injury, left knee, initial encounter: Secondary | ICD-10-CM | POA: Diagnosis not present

## 2022-03-09 DIAGNOSIS — M25562 Pain in left knee: Secondary | ICD-10-CM | POA: Diagnosis not present

## 2022-03-09 DIAGNOSIS — M25662 Stiffness of left knee, not elsewhere classified: Secondary | ICD-10-CM | POA: Diagnosis not present

## 2022-03-09 DIAGNOSIS — R262 Difficulty in walking, not elsewhere classified: Secondary | ICD-10-CM | POA: Diagnosis not present

## 2022-03-09 DIAGNOSIS — Z4889 Encounter for other specified surgical aftercare: Secondary | ICD-10-CM | POA: Diagnosis not present

## 2022-03-09 DIAGNOSIS — M23232 Derangement of other medial meniscus due to old tear or injury, left knee: Secondary | ICD-10-CM | POA: Diagnosis not present

## 2022-03-12 DIAGNOSIS — Z4889 Encounter for other specified surgical aftercare: Secondary | ICD-10-CM | POA: Diagnosis not present

## 2022-03-12 DIAGNOSIS — M25662 Stiffness of left knee, not elsewhere classified: Secondary | ICD-10-CM | POA: Diagnosis not present

## 2022-03-12 DIAGNOSIS — R262 Difficulty in walking, not elsewhere classified: Secondary | ICD-10-CM | POA: Diagnosis not present

## 2022-03-12 DIAGNOSIS — M25562 Pain in left knee: Secondary | ICD-10-CM | POA: Diagnosis not present

## 2022-03-12 DIAGNOSIS — M23232 Derangement of other medial meniscus due to old tear or injury, left knee: Secondary | ICD-10-CM | POA: Diagnosis not present

## 2022-03-15 DIAGNOSIS — M25562 Pain in left knee: Secondary | ICD-10-CM | POA: Diagnosis not present

## 2022-03-15 DIAGNOSIS — M23232 Derangement of other medial meniscus due to old tear or injury, left knee: Secondary | ICD-10-CM | POA: Diagnosis not present

## 2022-03-15 DIAGNOSIS — R262 Difficulty in walking, not elsewhere classified: Secondary | ICD-10-CM | POA: Diagnosis not present

## 2022-03-15 DIAGNOSIS — Z4889 Encounter for other specified surgical aftercare: Secondary | ICD-10-CM | POA: Diagnosis not present

## 2022-03-15 DIAGNOSIS — M25662 Stiffness of left knee, not elsewhere classified: Secondary | ICD-10-CM | POA: Diagnosis not present

## 2022-03-19 DIAGNOSIS — M25562 Pain in left knee: Secondary | ICD-10-CM | POA: Diagnosis not present

## 2022-03-19 DIAGNOSIS — M23232 Derangement of other medial meniscus due to old tear or injury, left knee: Secondary | ICD-10-CM | POA: Diagnosis not present

## 2022-03-19 DIAGNOSIS — Z4889 Encounter for other specified surgical aftercare: Secondary | ICD-10-CM | POA: Diagnosis not present

## 2022-03-19 DIAGNOSIS — R262 Difficulty in walking, not elsewhere classified: Secondary | ICD-10-CM | POA: Diagnosis not present

## 2022-03-19 DIAGNOSIS — M25662 Stiffness of left knee, not elsewhere classified: Secondary | ICD-10-CM | POA: Diagnosis not present

## 2022-03-22 DIAGNOSIS — M25662 Stiffness of left knee, not elsewhere classified: Secondary | ICD-10-CM | POA: Diagnosis not present

## 2022-03-22 DIAGNOSIS — R262 Difficulty in walking, not elsewhere classified: Secondary | ICD-10-CM | POA: Diagnosis not present

## 2022-03-22 DIAGNOSIS — Z4889 Encounter for other specified surgical aftercare: Secondary | ICD-10-CM | POA: Diagnosis not present

## 2022-03-22 DIAGNOSIS — M23232 Derangement of other medial meniscus due to old tear or injury, left knee: Secondary | ICD-10-CM | POA: Diagnosis not present

## 2022-03-22 DIAGNOSIS — M25562 Pain in left knee: Secondary | ICD-10-CM | POA: Diagnosis not present

## 2022-03-26 DIAGNOSIS — M25662 Stiffness of left knee, not elsewhere classified: Secondary | ICD-10-CM | POA: Diagnosis not present

## 2022-03-26 DIAGNOSIS — M25562 Pain in left knee: Secondary | ICD-10-CM | POA: Diagnosis not present

## 2022-03-26 DIAGNOSIS — M23232 Derangement of other medial meniscus due to old tear or injury, left knee: Secondary | ICD-10-CM | POA: Diagnosis not present

## 2022-03-26 DIAGNOSIS — Z4889 Encounter for other specified surgical aftercare: Secondary | ICD-10-CM | POA: Diagnosis not present

## 2022-03-26 DIAGNOSIS — R262 Difficulty in walking, not elsewhere classified: Secondary | ICD-10-CM | POA: Diagnosis not present

## 2022-03-27 DIAGNOSIS — S82142D Displaced bicondylar fracture of left tibia, subsequent encounter for closed fracture with routine healing: Secondary | ICD-10-CM | POA: Diagnosis not present

## 2022-03-29 DIAGNOSIS — M25562 Pain in left knee: Secondary | ICD-10-CM | POA: Diagnosis not present

## 2022-03-29 DIAGNOSIS — Z4889 Encounter for other specified surgical aftercare: Secondary | ICD-10-CM | POA: Diagnosis not present

## 2022-03-29 DIAGNOSIS — M23232 Derangement of other medial meniscus due to old tear or injury, left knee: Secondary | ICD-10-CM | POA: Diagnosis not present

## 2022-03-29 DIAGNOSIS — R262 Difficulty in walking, not elsewhere classified: Secondary | ICD-10-CM | POA: Diagnosis not present

## 2022-03-29 DIAGNOSIS — M25662 Stiffness of left knee, not elsewhere classified: Secondary | ICD-10-CM | POA: Diagnosis not present

## 2022-04-02 DIAGNOSIS — M94262 Chondromalacia, left knee: Secondary | ICD-10-CM | POA: Diagnosis not present

## 2022-04-02 DIAGNOSIS — M659 Synovitis and tenosynovitis, unspecified: Secondary | ICD-10-CM | POA: Diagnosis not present

## 2022-04-02 DIAGNOSIS — S83262A Peripheral tear of lateral meniscus, current injury, left knee, initial encounter: Secondary | ICD-10-CM | POA: Diagnosis not present

## 2022-04-02 DIAGNOSIS — M67262 Synovial hypertrophy, not elsewhere classified, left lower leg: Secondary | ICD-10-CM | POA: Diagnosis not present

## 2022-04-09 DIAGNOSIS — M25562 Pain in left knee: Secondary | ICD-10-CM | POA: Diagnosis not present

## 2022-04-09 DIAGNOSIS — M23232 Derangement of other medial meniscus due to old tear or injury, left knee: Secondary | ICD-10-CM | POA: Diagnosis not present

## 2022-04-09 DIAGNOSIS — M25662 Stiffness of left knee, not elsewhere classified: Secondary | ICD-10-CM | POA: Diagnosis not present

## 2022-04-09 DIAGNOSIS — R262 Difficulty in walking, not elsewhere classified: Secondary | ICD-10-CM | POA: Diagnosis not present

## 2022-04-09 DIAGNOSIS — Z4889 Encounter for other specified surgical aftercare: Secondary | ICD-10-CM | POA: Diagnosis not present

## 2022-04-12 DIAGNOSIS — M25562 Pain in left knee: Secondary | ICD-10-CM | POA: Diagnosis not present

## 2022-04-12 DIAGNOSIS — R262 Difficulty in walking, not elsewhere classified: Secondary | ICD-10-CM | POA: Diagnosis not present

## 2022-04-12 DIAGNOSIS — M25662 Stiffness of left knee, not elsewhere classified: Secondary | ICD-10-CM | POA: Diagnosis not present

## 2022-04-12 DIAGNOSIS — Z4889 Encounter for other specified surgical aftercare: Secondary | ICD-10-CM | POA: Diagnosis not present

## 2022-04-12 DIAGNOSIS — M23232 Derangement of other medial meniscus due to old tear or injury, left knee: Secondary | ICD-10-CM | POA: Diagnosis not present

## 2022-04-16 DIAGNOSIS — Z4889 Encounter for other specified surgical aftercare: Secondary | ICD-10-CM | POA: Diagnosis not present

## 2022-04-16 DIAGNOSIS — M25562 Pain in left knee: Secondary | ICD-10-CM | POA: Diagnosis not present

## 2022-04-16 DIAGNOSIS — M25662 Stiffness of left knee, not elsewhere classified: Secondary | ICD-10-CM | POA: Diagnosis not present

## 2022-04-16 DIAGNOSIS — R262 Difficulty in walking, not elsewhere classified: Secondary | ICD-10-CM | POA: Diagnosis not present

## 2022-04-16 DIAGNOSIS — M23232 Derangement of other medial meniscus due to old tear or injury, left knee: Secondary | ICD-10-CM | POA: Diagnosis not present

## 2022-04-23 DIAGNOSIS — Z4889 Encounter for other specified surgical aftercare: Secondary | ICD-10-CM | POA: Diagnosis not present

## 2022-04-23 DIAGNOSIS — M25562 Pain in left knee: Secondary | ICD-10-CM | POA: Diagnosis not present

## 2022-04-23 DIAGNOSIS — R262 Difficulty in walking, not elsewhere classified: Secondary | ICD-10-CM | POA: Diagnosis not present

## 2022-04-23 DIAGNOSIS — M23232 Derangement of other medial meniscus due to old tear or injury, left knee: Secondary | ICD-10-CM | POA: Diagnosis not present

## 2022-04-23 DIAGNOSIS — M25662 Stiffness of left knee, not elsewhere classified: Secondary | ICD-10-CM | POA: Diagnosis not present

## 2022-04-26 DIAGNOSIS — M23232 Derangement of other medial meniscus due to old tear or injury, left knee: Secondary | ICD-10-CM | POA: Diagnosis not present

## 2022-04-26 DIAGNOSIS — M25662 Stiffness of left knee, not elsewhere classified: Secondary | ICD-10-CM | POA: Diagnosis not present

## 2022-04-26 DIAGNOSIS — Z4889 Encounter for other specified surgical aftercare: Secondary | ICD-10-CM | POA: Diagnosis not present

## 2022-04-26 DIAGNOSIS — M25562 Pain in left knee: Secondary | ICD-10-CM | POA: Diagnosis not present

## 2022-04-26 DIAGNOSIS — R262 Difficulty in walking, not elsewhere classified: Secondary | ICD-10-CM | POA: Diagnosis not present

## 2022-04-30 DIAGNOSIS — M23232 Derangement of other medial meniscus due to old tear or injury, left knee: Secondary | ICD-10-CM | POA: Diagnosis not present

## 2022-04-30 DIAGNOSIS — R262 Difficulty in walking, not elsewhere classified: Secondary | ICD-10-CM | POA: Diagnosis not present

## 2022-04-30 DIAGNOSIS — M25662 Stiffness of left knee, not elsewhere classified: Secondary | ICD-10-CM | POA: Diagnosis not present

## 2022-04-30 DIAGNOSIS — Z4889 Encounter for other specified surgical aftercare: Secondary | ICD-10-CM | POA: Diagnosis not present

## 2022-04-30 DIAGNOSIS — M25562 Pain in left knee: Secondary | ICD-10-CM | POA: Diagnosis not present

## 2022-05-03 DIAGNOSIS — Z4889 Encounter for other specified surgical aftercare: Secondary | ICD-10-CM | POA: Diagnosis not present

## 2022-05-03 DIAGNOSIS — M25562 Pain in left knee: Secondary | ICD-10-CM | POA: Diagnosis not present

## 2022-05-03 DIAGNOSIS — R262 Difficulty in walking, not elsewhere classified: Secondary | ICD-10-CM | POA: Diagnosis not present

## 2022-05-03 DIAGNOSIS — M25662 Stiffness of left knee, not elsewhere classified: Secondary | ICD-10-CM | POA: Diagnosis not present

## 2022-05-03 DIAGNOSIS — M23232 Derangement of other medial meniscus due to old tear or injury, left knee: Secondary | ICD-10-CM | POA: Diagnosis not present

## 2022-05-10 DIAGNOSIS — M23232 Derangement of other medial meniscus due to old tear or injury, left knee: Secondary | ICD-10-CM | POA: Diagnosis not present

## 2022-05-10 DIAGNOSIS — M25562 Pain in left knee: Secondary | ICD-10-CM | POA: Diagnosis not present

## 2022-05-10 DIAGNOSIS — Z4889 Encounter for other specified surgical aftercare: Secondary | ICD-10-CM | POA: Diagnosis not present

## 2022-05-10 DIAGNOSIS — R262 Difficulty in walking, not elsewhere classified: Secondary | ICD-10-CM | POA: Diagnosis not present

## 2022-05-10 DIAGNOSIS — M25662 Stiffness of left knee, not elsewhere classified: Secondary | ICD-10-CM | POA: Diagnosis not present

## 2022-05-14 DIAGNOSIS — R262 Difficulty in walking, not elsewhere classified: Secondary | ICD-10-CM | POA: Diagnosis not present

## 2022-05-14 DIAGNOSIS — M25662 Stiffness of left knee, not elsewhere classified: Secondary | ICD-10-CM | POA: Diagnosis not present

## 2022-05-14 DIAGNOSIS — M23232 Derangement of other medial meniscus due to old tear or injury, left knee: Secondary | ICD-10-CM | POA: Diagnosis not present

## 2022-05-14 DIAGNOSIS — M25562 Pain in left knee: Secondary | ICD-10-CM | POA: Diagnosis not present

## 2022-05-14 DIAGNOSIS — Z4889 Encounter for other specified surgical aftercare: Secondary | ICD-10-CM | POA: Diagnosis not present

## 2022-05-21 DIAGNOSIS — M23232 Derangement of other medial meniscus due to old tear or injury, left knee: Secondary | ICD-10-CM | POA: Diagnosis not present

## 2022-05-21 DIAGNOSIS — M25662 Stiffness of left knee, not elsewhere classified: Secondary | ICD-10-CM | POA: Diagnosis not present

## 2022-05-21 DIAGNOSIS — Z4889 Encounter for other specified surgical aftercare: Secondary | ICD-10-CM | POA: Diagnosis not present

## 2022-05-21 DIAGNOSIS — M25562 Pain in left knee: Secondary | ICD-10-CM | POA: Diagnosis not present

## 2022-05-21 DIAGNOSIS — R262 Difficulty in walking, not elsewhere classified: Secondary | ICD-10-CM | POA: Diagnosis not present

## 2022-06-04 DIAGNOSIS — E785 Hyperlipidemia, unspecified: Secondary | ICD-10-CM | POA: Diagnosis not present

## 2022-06-04 DIAGNOSIS — Z780 Asymptomatic menopausal state: Secondary | ICD-10-CM | POA: Diagnosis not present

## 2022-06-04 DIAGNOSIS — R7301 Impaired fasting glucose: Secondary | ICD-10-CM | POA: Diagnosis not present

## 2022-06-04 DIAGNOSIS — R7989 Other specified abnormal findings of blood chemistry: Secondary | ICD-10-CM | POA: Diagnosis not present

## 2022-06-04 DIAGNOSIS — I10 Essential (primary) hypertension: Secondary | ICD-10-CM | POA: Diagnosis not present

## 2022-06-04 DIAGNOSIS — E559 Vitamin D deficiency, unspecified: Secondary | ICD-10-CM | POA: Diagnosis not present

## 2022-06-06 DIAGNOSIS — Z0000 Encounter for general adult medical examination without abnormal findings: Secondary | ICD-10-CM | POA: Diagnosis not present

## 2022-06-11 DIAGNOSIS — Q8789 Other specified congenital malformation syndromes, not elsewhere classified: Secondary | ICD-10-CM | POA: Diagnosis not present

## 2022-06-11 DIAGNOSIS — R82998 Other abnormal findings in urine: Secondary | ICD-10-CM | POA: Diagnosis not present

## 2022-06-11 DIAGNOSIS — E785 Hyperlipidemia, unspecified: Secondary | ICD-10-CM | POA: Diagnosis not present

## 2022-06-11 DIAGNOSIS — I10 Essential (primary) hypertension: Secondary | ICD-10-CM | POA: Diagnosis not present

## 2022-06-11 DIAGNOSIS — Z0000 Encounter for general adult medical examination without abnormal findings: Secondary | ICD-10-CM | POA: Diagnosis not present

## 2022-06-11 DIAGNOSIS — R7301 Impaired fasting glucose: Secondary | ICD-10-CM | POA: Diagnosis not present

## 2022-06-11 DIAGNOSIS — F4321 Adjustment disorder with depressed mood: Secondary | ICD-10-CM | POA: Diagnosis not present

## 2022-06-11 DIAGNOSIS — M85852 Other specified disorders of bone density and structure, left thigh: Secondary | ICD-10-CM | POA: Diagnosis not present

## 2022-06-11 DIAGNOSIS — M25562 Pain in left knee: Secondary | ICD-10-CM | POA: Diagnosis not present

## 2022-06-11 DIAGNOSIS — E559 Vitamin D deficiency, unspecified: Secondary | ICD-10-CM | POA: Diagnosis not present

## 2022-06-19 ENCOUNTER — Ambulatory Visit: Payer: Self-pay | Admitting: Student

## 2022-06-19 DIAGNOSIS — S82152D Displaced fracture of left tibial tuberosity, subsequent encounter for closed fracture with routine healing: Secondary | ICD-10-CM | POA: Diagnosis not present

## 2022-06-19 DIAGNOSIS — S82142D Displaced bicondylar fracture of left tibia, subsequent encounter for closed fracture with routine healing: Secondary | ICD-10-CM | POA: Diagnosis not present

## 2022-07-18 ENCOUNTER — Encounter (INDEPENDENT_AMBULATORY_CARE_PROVIDER_SITE_OTHER): Payer: Self-pay

## 2022-07-23 ENCOUNTER — Other Ambulatory Visit: Payer: Self-pay | Admitting: Internal Medicine

## 2022-07-23 DIAGNOSIS — Z1231 Encounter for screening mammogram for malignant neoplasm of breast: Secondary | ICD-10-CM

## 2022-07-27 DIAGNOSIS — R058 Other specified cough: Secondary | ICD-10-CM | POA: Diagnosis not present

## 2022-07-27 DIAGNOSIS — Z1152 Encounter for screening for COVID-19: Secondary | ICD-10-CM | POA: Diagnosis not present

## 2022-07-27 DIAGNOSIS — J069 Acute upper respiratory infection, unspecified: Secondary | ICD-10-CM | POA: Diagnosis not present

## 2022-07-27 DIAGNOSIS — R5383 Other fatigue: Secondary | ICD-10-CM | POA: Diagnosis not present

## 2022-07-27 DIAGNOSIS — I10 Essential (primary) hypertension: Secondary | ICD-10-CM | POA: Diagnosis not present

## 2022-07-27 DIAGNOSIS — R0981 Nasal congestion: Secondary | ICD-10-CM | POA: Diagnosis not present

## 2022-07-31 NOTE — H&P (Signed)
[facility] ([facility]) H&P  Patient ID: Julie Moran MRN: [id number] DOB/AGE: January 22, 1956 66 y.o.  Reason for Surgery: hardware removal left tibia  HPI: Julie Moran is an 66 y.o. female presenting for surgery on left lower extremity. Patient suffered a fall in June 2022, resulting in a complex left tibial plateau fracture. She underwent ORIF of the left tibial plateau by Dr. Doreatha Martin 05/25/22. She did well for several months post-operatively and was able to return to putting full weight on the leg. She underwent several weeks of physical therapy post-operatively. Despite the fracture fully healing, patient noted to have continued discomfort in the left knee. She subsequently underwent an knee arthroscopy with partial menisectomy Dr. Stann Mainland in April 2023. She tolerated this well but still has complaints of discomfort in the left knee. She has failed conservative management of her pain including anti-inflammatories, lose dose narcotics, continued physical therapy. She presents now for removal of her hardware.    Past Medical History:  Diagnosis Date   Allergy    Atrophic vaginitis    Birt-Hogg-Dube syndrome    lung cysts    Birt-Hogg-Dube syndrome    Chest pain    Colon polyps    adenomatous   Constipation    Depression    Diverticulosis    Dry skin    Fatty liver    Floaters    Gall bladder disease    Glaucoma    Heart murmur    Herpes simplex    Hyperlipidemia    Hypertension    Joint pain    Lichen sclerosus    Pancreatitis    Pancreatitis    Pneumothorax, right    spontaneous   Shortness of breath    Shortness of breath on exertion    Swelling of both lower extremities    Trouble in sleeping     Past Surgical History:  Procedure Laterality Date   ABDOMINAL HYSTERECTOMY     BREAST BIOPSY  03/29/2006   BREAST BIOPSY Bilateral 06/14/1998   BREAST EXCISIONAL BIOPSY Bilateral 1999   BREAST LUMPECTOMY  1973,1992,2001   x5 times total , both  breasts   CHOLECYSTECTOMY     COLONOSCOPY     2016   epidermoid cyst Right 02/2020   below Right breast   EXTERNAL FIXATION LEG Left 05/25/2021   Procedure: OPEN REDUCTION INTERNAL FIXATION  TIBIAL PLATEAU;  Surgeon: Shona Needles, MD;  Location: [facility];  Service: Orthopedics;  Laterality: Left;   hysterctomy     abdominal   POLYPECTOMY     right ovary removed     UPPER GASTROINTESTINAL ENDOSCOPY      Family History  Problem Relation Age of Onset   Diabetes Mother    Breast cancer Mother    CVA Mother    Hypertension Mother    Heart disease Mother    Thyroid disease Mother    Cancer Mother    Diabetes Father    Prostate cancer Father    Heart disease Father    Hypertension Father    Hyperlipidemia Father    Thyroid disease Father    Cancer Father    Obesity Father    Liver disease Paternal Grandmother    Breast cancer Maternal Aunt        x 3   CVA Paternal Grandfather    Colon cancer Neg Hx    Esophageal cancer Neg Hx    Stomach cancer Neg Hx    Rectal cancer Neg Hx  Social History:  reports that she has never smoked. She has never used smokeless tobacco. She reports that she does not drink alcohol and does not use drugs.  Allergies:  Allergies  Allergen Reactions   No Known Allergies     Medications: I have reviewed the patient's current medications. Prior to Admission:  No medications prior to admission.    ROS: Constitutional: No fever or chills Vision: No changes in vision ENT: No difficulty swallowing CV: No chest pain Pulm: No SOB or wheezing GI: No nausea or vomiting GU: No urgency or inability to hold urine Skin: No poor wound healing Neurologic: No numbness or tingling Psychiatric: No depression or anxiety Heme: No bruising Allergic: No reaction to medications or food   Exam: There were no vitals taken for this visit. General:NAD Orientation:Alert and oriented x4 Mood and Affect: Mood and affect approropiate, pleasant and  cooperative Gait: Within normal limits. No significant antalgia Coordination and balance: WNL  LLE: Well healed surgical incisions. ***  RLE: Skin without lesions. No tenderness to palpation. Full painless ROM, full strength in each muscle group without evidence of instability. Motor and sensory function at baseline. Neurovascularly intact.   Medical Decision Making: Data: Imaging: AP and lateral views of the left knee shows healed proximal tibia fracture. No signs of hardware failure/loosening.  Labs: No results found for this or any previous visit (from the past 168 hour(s)).  Assessment/Plan: 66 year old female s/p ORIF left tibial plateau fracture 05/25/2021  Patient continues to have pain in the left knee despite her fracture being fully healed. I would recommend proceeding with hardware removal form the left proximal tibia. Risks and benefits of the procedure have been discussed with the patient. Risks discussed included bleeding, infection,re-fracture of the tibia, damage to surrounding nerves and blood vessels, continued pain, post-traumatic arthritis, DVT/PE and anesthesia complications.   Gwinda Passe PA-C [facility] [phone] (office) [email or web address]

## 2022-08-15 ENCOUNTER — Ambulatory Visit
Admission: RE | Admit: 2022-08-15 | Discharge: 2022-08-15 | Disposition: A | Discharge status: Home / Self Care | Payer: Medicare PPO | Source: Ambulatory Visit | Attending: Internal Medicine | Admitting: Internal Medicine

## 2022-08-15 DIAGNOSIS — D1801 Hemangioma of skin and subcutaneous tissue: Secondary | ICD-10-CM | POA: Diagnosis not present

## 2022-08-15 DIAGNOSIS — L718 Other rosacea: Secondary | ICD-10-CM | POA: Diagnosis not present

## 2022-08-15 DIAGNOSIS — Z1231 Encounter for screening mammogram for malignant neoplasm of breast: Secondary | ICD-10-CM

## 2022-08-15 DIAGNOSIS — L821 Other seborrheic keratosis: Secondary | ICD-10-CM | POA: Diagnosis not present

## 2022-08-15 DIAGNOSIS — L812 Freckles: Secondary | ICD-10-CM | POA: Diagnosis not present

## 2022-08-16 ENCOUNTER — Encounter (HOSPITAL_COMMUNITY): Payer: Self-pay | Admitting: Student

## 2022-08-16 ENCOUNTER — Other Ambulatory Visit: Payer: Self-pay

## 2022-08-16 NOTE — Anesthesia Preprocedure Evaluation (Addendum)
Anesthesia Evaluation  Patient identified by MRN, date of birth, ID band Patient awake    Reviewed: Allergy & Precautions, NPO status , Patient's Chart, lab work & pertinent test results  History of Anesthesia Complications Negative for: history of anesthetic complications  Airway Mallampati: II  TM Distance: >3 FB Neck ROM: Full    Dental no notable dental hx. (+) Teeth Intact, Dental Advisory Given   Pulmonary  Birt-Hogg-Dube syndrome   Pulmonary exam normal        Cardiovascular hypertension, Pt. on medications Normal cardiovascular exam     Neuro/Psych Depression    GI/Hepatic negative GI ROS, Neg liver ROS,   Endo/Other  negative endocrine ROS  Renal/GU Renal InsufficiencyRenal disease  negative genitourinary   Musculoskeletal Healed tibial plateau fracture   Abdominal   Peds  Hematology negative hematology ROS (+)   Anesthesia Other Findings glaucoma  Reproductive/Obstetrics negative OB ROS                           Anesthesia Physical Anesthesia Plan  ASA: 2  Anesthesia Plan: General   Post-op Pain Management: Tylenol PO (pre-op)*   Induction: Intravenous  PONV Risk Score and Plan: 3 and Treatment may vary due to age or medical condition, Midazolam, Dexamethasone and Ondansetron  Airway Management Planned: LMA  Additional Equipment: None  Intra-op Plan:   Post-operative Plan: Extubation in OR  Informed Consent: I have reviewed the patients History and Physical, chart, labs and discussed the procedure including the risks, benefits and alternatives for the proposed anesthesia with the patient or authorized representative who has indicated his/her understanding and acceptance.     Dental advisory given  Plan Discussed with: CRNA  Anesthesia Plan Comments:        Anesthesia Quick Evaluation

## 2022-08-16 NOTE — Progress Notes (Signed)
Mrs Rollyson denies chest pain or shortness of breath. Patient denies having any s/s of Covid in her household, also denies any known exposure to Covid.   Mrs Lwin's PCP is Dr. Bevelyn Buckles.  Mrs. Roel lives at independent living at [facility], patient will spend Friday night in the [place] at [facility].

## 2022-08-17 ENCOUNTER — Encounter (HOSPITAL_COMMUNITY)
Admission: RE | Disposition: A | Discharge status: Home / Self Care | Payer: Self-pay | Source: Home / Self Care | Attending: Student

## 2022-08-17 ENCOUNTER — Encounter (HOSPITAL_COMMUNITY): Payer: Self-pay | Admitting: Student

## 2022-08-17 ENCOUNTER — Ambulatory Visit (HOSPITAL_BASED_OUTPATIENT_CLINIC_OR_DEPARTMENT_OTHER): Payer: Medicare PPO | Admitting: Anesthesiology

## 2022-08-17 ENCOUNTER — Ambulatory Visit (HOSPITAL_COMMUNITY)
Admission: RE | Admit: 2022-08-17 | Discharge: 2022-08-17 | Disposition: A | Discharge status: Home / Self Care | Payer: Medicare PPO | Attending: Student | Admitting: Student

## 2022-08-17 ENCOUNTER — Ambulatory Visit (HOSPITAL_COMMUNITY): Payer: Medicare PPO

## 2022-08-17 ENCOUNTER — Other Ambulatory Visit: Payer: Self-pay

## 2022-08-17 ENCOUNTER — Ambulatory Visit (HOSPITAL_COMMUNITY): Payer: Medicare PPO | Admitting: Anesthesiology

## 2022-08-17 DIAGNOSIS — I10 Essential (primary) hypertension: Secondary | ICD-10-CM | POA: Diagnosis not present

## 2022-08-17 DIAGNOSIS — S82142A Displaced bicondylar fracture of left tibia, initial encounter for closed fracture: Secondary | ICD-10-CM | POA: Diagnosis not present

## 2022-08-17 DIAGNOSIS — W19XXXD Unspecified fall, subsequent encounter: Secondary | ICD-10-CM | POA: Diagnosis not present

## 2022-08-17 DIAGNOSIS — Q8789 Other specified congenital malformation syndromes, not elsewhere classified: Secondary | ICD-10-CM | POA: Insufficient documentation

## 2022-08-17 DIAGNOSIS — T8484XA Pain due to internal orthopedic prosthetic devices, implants and grafts, initial encounter: Secondary | ICD-10-CM | POA: Diagnosis not present

## 2022-08-17 DIAGNOSIS — Z472 Encounter for removal of internal fixation device: Secondary | ICD-10-CM | POA: Diagnosis not present

## 2022-08-17 DIAGNOSIS — S82142D Displaced bicondylar fracture of left tibia, subsequent encounter for closed fracture with routine healing: Secondary | ICD-10-CM | POA: Insufficient documentation

## 2022-08-17 DIAGNOSIS — Z0389 Encounter for observation for other suspected diseases and conditions ruled out: Secondary | ICD-10-CM | POA: Diagnosis not present

## 2022-08-17 DIAGNOSIS — S83105A Unspecified dislocation of left knee, initial encounter: Secondary | ICD-10-CM | POA: Diagnosis not present

## 2022-08-17 HISTORY — PX: HARDWARE REMOVAL: SHX979

## 2022-08-17 HISTORY — DX: Pneumonia, unspecified organism: J18.9

## 2022-08-17 LAB — CBC
HCT: 38.7 % (ref 36.0–46.0)
Hemoglobin: 13.6 g/dL (ref 12.0–15.0)
MCH: 30.9 pg (ref 26.0–34.0)
MCHC: 35.1 g/dL (ref 30.0–36.0)
MCV: 88 fL (ref 80.0–100.0)
Platelets: 121 10*3/uL — ABNORMAL LOW (ref 150–400)
RBC: 4.4 MIL/uL (ref 3.87–5.11)
RDW: 13.4 % (ref 11.5–15.5)
WBC: 4.4 10*3/uL (ref 4.0–10.5)
nRBC: 0 % (ref 0.0–0.2)

## 2022-08-17 SURGERY — REMOVAL, HARDWARE
Anesthesia: General | Laterality: Left

## 2022-08-17 MED ORDER — DEXAMETHASONE SODIUM PHOSPHATE 10 MG/ML IJ SOLN
INTRAMUSCULAR | Status: AC
  Filled 2022-08-17: qty 1

## 2022-08-17 MED ORDER — ACETAMINOPHEN 500 MG PO TABS
1000.0000 mg | ORAL_TABLET | Freq: Once | ORAL | Status: AC
  Administered 2022-08-17: 1000 mg via ORAL
  Filled 2022-08-17: qty 2

## 2022-08-17 MED ORDER — FENTANYL CITRATE (PF) 100 MCG/2ML IJ SOLN
25.0000 ug | INTRAMUSCULAR | Status: DC | PRN
  Administered 2022-08-17: 50 ug via INTRAVENOUS

## 2022-08-17 MED ORDER — MIDAZOLAM HCL 2 MG/2ML IJ SOLN
INTRAMUSCULAR | Status: AC
  Filled 2022-08-17: qty 2

## 2022-08-17 MED ORDER — CHLORHEXIDINE GLUCONATE 0.12 % MT SOLN
15.0000 mL | Freq: Once | OROMUCOSAL | Status: AC
  Administered 2022-08-17: 15 mL via OROMUCOSAL
  Filled 2022-08-17: qty 15

## 2022-08-17 MED ORDER — EPHEDRINE SULFATE-NACL 50-0.9 MG/10ML-% IV SOSY
PREFILLED_SYRINGE | INTRAVENOUS | Status: DC | PRN
  Administered 2022-08-17 (×3): 5 mg via INTRAVENOUS

## 2022-08-17 MED ORDER — FENTANYL CITRATE (PF) 250 MCG/5ML IJ SOLN
INTRAMUSCULAR | Status: AC
  Filled 2022-08-17: qty 5

## 2022-08-17 MED ORDER — OXYCODONE HCL 5 MG/5ML PO SOLN: 5.0000 mg | Freq: Once | ORAL | Status: DC | PRN

## 2022-08-17 MED ORDER — 0.9 % SODIUM CHLORIDE (POUR BTL) OPTIME
TOPICAL | Status: DC | PRN
  Administered 2022-08-17: 1000 mL

## 2022-08-17 MED ORDER — EPHEDRINE 5 MG/ML INJ
INTRAVENOUS | Status: AC
  Filled 2022-08-17: qty 5

## 2022-08-17 MED ORDER — VANCOMYCIN HCL 1000 MG IV SOLR
INTRAVENOUS | Status: AC
  Filled 2022-08-17: qty 20

## 2022-08-17 MED ORDER — FENTANYL CITRATE (PF) 250 MCG/5ML IJ SOLN
INTRAMUSCULAR | Status: DC | PRN
Start: 2022-08-17 — End: 2022-08-17
  Administered 2022-08-17: 25 ug via INTRAVENOUS
  Administered 2022-08-17: 50 ug via INTRAVENOUS
  Administered 2022-08-17 (×2): 25 ug via INTRAVENOUS

## 2022-08-17 MED ORDER — LIDOCAINE 2% (20 MG/ML) 5 ML SYRINGE
INTRAMUSCULAR | Status: DC | PRN
  Administered 2022-08-17: 100 mg via INTRAVENOUS

## 2022-08-17 MED ORDER — LACTATED RINGERS IV SOLN: INTRAVENOUS | Status: DC

## 2022-08-17 MED ORDER — FENTANYL CITRATE (PF) 100 MCG/2ML IJ SOLN
INTRAMUSCULAR | Status: AC
  Filled 2022-08-17: qty 2

## 2022-08-17 MED ORDER — OXYCODONE HCL 5 MG PO TABS: 5.0000 mg | ORAL_TABLET | Freq: Once | ORAL | Status: DC | PRN

## 2022-08-17 MED ORDER — ORAL CARE MOUTH RINSE: 15.0000 mL | Freq: Once | OROMUCOSAL | Status: AC

## 2022-08-17 MED ORDER — TRAMADOL HCL 50 MG PO TABS
50.0000 mg | ORAL_TABLET | Freq: Once | ORAL | Status: AC
  Administered 2022-08-17: 50 mg via ORAL

## 2022-08-17 MED ORDER — BUPIVACAINE-EPINEPHRINE (PF) 0.5% -1:200000 IJ SOLN
INTRAMUSCULAR | Status: DC | PRN
  Administered 2022-08-17: 20 mL

## 2022-08-17 MED ORDER — ONDANSETRON HCL 4 MG/2ML IJ SOLN
INTRAMUSCULAR | Status: DC | PRN
  Administered 2022-08-17: 4 mg via INTRAVENOUS

## 2022-08-17 MED ORDER — ONDANSETRON HCL 4 MG/2ML IJ SOLN
INTRAMUSCULAR | Status: AC
  Filled 2022-08-17: qty 2

## 2022-08-17 MED ORDER — BUPIVACAINE-EPINEPHRINE (PF) 0.5% -1:200000 IJ SOLN
INTRAMUSCULAR | Status: AC
  Filled 2022-08-17: qty 30

## 2022-08-17 MED ORDER — PROPOFOL 10 MG/ML IV BOLUS
INTRAVENOUS | Status: AC
  Filled 2022-08-17: qty 20

## 2022-08-17 MED ORDER — PROPOFOL 10 MG/ML IV BOLUS
INTRAVENOUS | Status: DC | PRN
  Administered 2022-08-17: 140 mg via INTRAVENOUS

## 2022-08-17 MED ORDER — MIDAZOLAM HCL 5 MG/5ML IJ SOLN
INTRAMUSCULAR | Status: DC | PRN
  Administered 2022-08-17 (×2): 2 mg via INTRAVENOUS

## 2022-08-17 MED ORDER — CEFAZOLIN SODIUM-DEXTROSE 2-4 GM/100ML-% IV SOLN
2.0000 g | INTRAVENOUS | Status: AC
  Administered 2022-08-17: 2 g via INTRAVENOUS
  Filled 2022-08-17: qty 100

## 2022-08-17 MED ORDER — AMISULPRIDE (ANTIEMETIC) 5 MG/2ML IV SOLN: 10.0000 mg | Freq: Once | INTRAVENOUS | Status: DC | PRN

## 2022-08-17 MED ORDER — TRAMADOL HCL 50 MG PO TABS
ORAL_TABLET | ORAL | Status: AC
  Filled 2022-08-17: qty 1

## 2022-08-17 MED ORDER — DEXAMETHASONE SODIUM PHOSPHATE 10 MG/ML IJ SOLN
INTRAMUSCULAR | Status: DC | PRN
  Administered 2022-08-17: 10 mg via INTRAVENOUS

## 2022-08-17 MED ORDER — LACTATED RINGERS IV SOLN: INTRAVENOUS | Status: DC | PRN

## 2022-08-17 MED ORDER — LIDOCAINE 2% (20 MG/ML) 5 ML SYRINGE
INTRAMUSCULAR | Status: AC
  Filled 2022-08-17: qty 5

## 2022-08-17 SURGICAL SUPPLY — 64 items
ACE 6IN IMPLANT
APL PRP STRL LF DISP 70% ISPRP (MISCELLANEOUS) ×1
APL SKNCLS STERI-STRIP NONHPOA (GAUZE/BANDAGES/DRESSINGS) ×1
BAG COUNTER SPONGE SURGICOUNT (BAG) ×1 IMPLANT
BAG SPNG CNTER NS LX DISP (BAG) ×1
BANDAGE ESMARK 6X9 LF (GAUZE/BANDAGES/DRESSINGS) ×1 IMPLANT
BENZOIN TINCTURE PRP APPL 2/3 (GAUZE/BANDAGES/DRESSINGS) IMPLANT
BNDG CMPR 9X6 STRL LF SNTH (GAUZE/BANDAGES/DRESSINGS) ×1
BNDG COHESIVE 6X5 TAN STRL LF (GAUZE/BANDAGES/DRESSINGS) ×1 IMPLANT
BNDG ELASTIC 4X5.8 VLCR STR LF (GAUZE/BANDAGES/DRESSINGS) ×1 IMPLANT
BNDG ELASTIC 6X5.8 VLCR STR LF (GAUZE/BANDAGES/DRESSINGS) ×1 IMPLANT
BNDG ESMARK 6X9 LF (GAUZE/BANDAGES/DRESSINGS) ×1
BRUSH SCRUB EZ PLAIN DRY (MISCELLANEOUS) ×2 IMPLANT
CAST PADDING STERILE 4X4 (CAST SUPPLIES) IMPLANT
CAST PADDING STERILE 6X4 (CAST SUPPLIES) IMPLANT
CHLORAPREP W/TINT 26 (MISCELLANEOUS) ×1 IMPLANT
CLOSURE STERI STRIP 1/2 X4 (GAUZE/BANDAGES/DRESSINGS) IMPLANT
COVER SURGICAL LIGHT HANDLE (MISCELLANEOUS) ×2 IMPLANT
CUFF TOURN SGL QUICK 18X4 (TOURNIQUET CUFF) IMPLANT
CUFF TOURN SGL QUICK 24 (TOURNIQUET CUFF)
CUFF TOURN SGL QUICK 34 (TOURNIQUET CUFF)
CUFF TRNQT CYL 24X4X16.5-23 (TOURNIQUET CUFF) IMPLANT
CUFF TRNQT CYL 34X4.125X (TOURNIQUET CUFF) IMPLANT
DRAPE C-ARM 42X72 X-RAY (DRAPES) IMPLANT
DRAPE C-ARMOR (DRAPES) ×1 IMPLANT
DRAPE U-SHAPE 47X51 STRL (DRAPES) ×1 IMPLANT
ELECT REM PT RETURN 9FT ADLT (ELECTROSURGICAL) ×1
ELECTRODE REM PT RTRN 9FT ADLT (ELECTROSURGICAL) ×1 IMPLANT
GAUZE SPONGE 4X4 12PLY STRL (GAUZE/BANDAGES/DRESSINGS) ×1 IMPLANT
GLOVE BIO SURGEON STRL SZ 6.5 (GLOVE) ×3 IMPLANT
GLOVE BIO SURGEON STRL SZ7.5 (GLOVE) ×4 IMPLANT
GLOVE BIOGEL PI IND STRL 6.5 (GLOVE) ×1 IMPLANT
GLOVE BIOGEL PI IND STRL 7.5 (GLOVE) ×1 IMPLANT
GOWN STRL REUS W/ TWL LRG LVL3 (GOWN DISPOSABLE) ×2 IMPLANT
GOWN STRL REUS W/TWL LRG LVL3 (GOWN DISPOSABLE) ×2
KIT BASIN OR (CUSTOM PROCEDURE TRAY) ×1 IMPLANT
KIT TURNOVER KIT B (KITS) ×1 IMPLANT
MANIFOLD NEPTUNE II (INSTRUMENTS) ×1 IMPLANT
NDL 22X1.5 STRL (OR ONLY) (MISCELLANEOUS) IMPLANT
NEEDLE 22X1.5 STRL (OR ONLY) (MISCELLANEOUS) IMPLANT
NS IRRIG 1000ML POUR BTL (IV SOLUTION) ×1 IMPLANT
PACK ORTHO EXTREMITY (CUSTOM PROCEDURE TRAY) ×1 IMPLANT
PAD ARMBOARD 7.5X6 YLW CONV (MISCELLANEOUS) ×2 IMPLANT
PADDING CAST COTTON 6X4 STRL (CAST SUPPLIES) ×3 IMPLANT
SPONGE T-LAP 18X18 ~~LOC~~+RFID (SPONGE) ×1 IMPLANT
STAPLER VISISTAT 35W (STAPLE) IMPLANT
STOCKINETTE IMPERVIOUS LG (DRAPES) ×1 IMPLANT
STRIP CLOSURE SKIN 1/2X4 (GAUZE/BANDAGES/DRESSINGS) IMPLANT
SUCTION FRAZIER HANDLE 10FR (MISCELLANEOUS)
SUCTION TUBE FRAZIER 10FR DISP (MISCELLANEOUS) IMPLANT
SUT ETHILON 3 0 PS 1 (SUTURE) IMPLANT
SUT MNCRL AB 3-0 PS2 18 (SUTURE) ×1 IMPLANT
SUT MON AB 2-0 CT1 36 (SUTURE) ×1 IMPLANT
SUT PDS AB 2-0 CT1 27 (SUTURE) IMPLANT
SUT VIC AB 0 CT1 27 (SUTURE) ×1
SUT VIC AB 0 CT1 27XBRD ANBCTR (SUTURE) IMPLANT
SUT VIC AB 2-0 CT1 27 (SUTURE) ×2
SUT VIC AB 2-0 CT1 TAPERPNT 27 (SUTURE) IMPLANT
SYR CONTROL 10ML LL (SYRINGE) IMPLANT
TOWEL GREEN STERILE (TOWEL DISPOSABLE) ×2 IMPLANT
TOWEL GREEN STERILE FF (TOWEL DISPOSABLE) ×2 IMPLANT
TUBE CONNECTING 12X1/4 (SUCTIONS) ×1 IMPLANT
UNDERPAD 30X36 HEAVY ABSORB (UNDERPADS AND DIAPERS) ×1 IMPLANT
YANKAUER SUCT BULB TIP NO VENT (SUCTIONS) ×1 IMPLANT

## 2022-08-17 NOTE — Anesthesia Procedure Notes (Signed)
Procedure Name: LMA Insertion Date/Time: 08/17/2022 7:40 AM  Performed by: Jenne Campus, CRNAPre-anesthesia Checklist: Patient identified, Emergency Drugs available, Suction available and Patient being monitored Patient Re-evaluated:Patient Re-evaluated prior to induction Oxygen Delivery Method: Circle System Utilized Preoxygenation: Pre-oxygenation with 100% oxygen Induction Type: IV induction Ventilation: Mask ventilation without difficulty LMA: LMA inserted LMA Size: 4.0 Number of attempts: 1 Airway Equipment and Method: Bite block Placement Confirmation: positive ETCO2 and breath sounds checked- equal and bilateral Tube secured with: Tape Dental Injury: Teeth and Oropharynx as per pre-operative assessment

## 2022-08-17 NOTE — Interval H&P Note (Signed)
History and Physical Interval Note:  08/17/2022 7:17 AM  Julie Moran  has presented today for surgery, with the diagnosis of Healed tibial plateau fracture.  The various methods of treatment have been discussed with the patient and family. After consideration of risks, benefits and other options for treatment, the patient has consented to  Procedure(s): HARDWARE REMOVAL LEFT PROXIMAL TIBIA (Left) as a surgical intervention.  The patient's history has been reviewed, patient examined, no change in status, stable for surgery.  I have reviewed the patient's chart and labs.  Questions were answered to the patient's satisfaction.     Lennette Bihari P Reneshia Zuccaro

## 2022-08-17 NOTE — Transfer of Care (Signed)
Immediate Anesthesia Transfer of Care Note  Patient: Julie Moran  Procedure(s) Performed: HARDWARE REMOVAL LEFT PROXIMAL TIBIA (Left)  Patient Location: PACU  Anesthesia Type:General  Level of Consciousness: awake, oriented and patient cooperative  Airway & Oxygen Therapy: Patient Spontanous Breathing and Patient connected to nasal cannula oxygen  Post-op Assessment: Report given to RN and Post -op Vital signs reviewed and stable  Post vital signs: Reviewed  Last Vitals:  Vitals Value Taken Time  BP 141/76 08/17/22 0916  Temp 36.4 C 08/17/22 0915  Pulse 61 08/17/22 0919  Resp 11 08/17/22 0919  SpO2 99 % 08/17/22 0919  Vitals shown include unvalidated device data.  Last Pain:  Vitals:   08/17/22 0618  TempSrc: Oral  PainSc:          Complications: No notable events documented.

## 2022-08-17 NOTE — Anesthesia Postprocedure Evaluation (Signed)
Anesthesia Post Note  Patient: Julie Moran  Procedure(s) Performed: HARDWARE REMOVAL LEFT PROXIMAL TIBIA (Left)     Patient location during evaluation: PACU Anesthesia Type: General Level of consciousness: awake and alert Pain management: pain level controlled Vital Signs Assessment: post-procedure vital signs reviewed and stable Respiratory status: spontaneous breathing, nonlabored ventilation and respiratory function stable Cardiovascular status: blood pressure returned to baseline Postop Assessment: no apparent nausea or vomiting Anesthetic complications: no   No notable events documented.  Last Vitals:  Vitals:   08/17/22 0945 08/17/22 1000  BP: 137/66 132/74  Pulse: (!) 56 61  Resp: 15 14  Temp:  36.7 C  SpO2: 100% 97%    Last Pain:  Vitals:   08/17/22 1013  TempSrc:   PainSc: [place]

## 2022-08-17 NOTE — Discharge Instructions (Addendum)
[facility] Discharge Instructions   General Discharge Instructions  WEIGHT BEARING STATUS: Weightbearing as tolerated left lower extremity  RANGE OF MOTION/ACTIVITY: ok for unrestricted knee range of motion  Wound Care: You may remove your surgical dressing on post-op day #3 (Monday 08/20/22).  Incisions can be left open to air if there is no drainage. If incision continues to have drainage, follow wound care instructions below. Okay to shower if no drainage from incisions.  DVT/PE prophylaxis: None  Diet: as you were eating previously.  Can use over the counter stool softeners and bowel preparations, such as Miralax, to help with bowel movements.  Narcotics can be constipating.  Be sure to drink plenty of fluids  PAIN MEDICATION USE AND EXPECTATIONS  You have likely been given narcotic medications to help control your pain.  After a traumatic event that results in an fracture (broken bone) with or without surgery, it is ok to use narcotic pain medications to help control one's pain.  We understand that everyone responds to pain differently and each individual patient will be evaluated on a regular basis for the continued need for narcotic medications. Ideally, narcotic medication use should last no more than 6-8 weeks (coinciding with fracture healing).   As a patient it is your responsibility as well to monitor narcotic medication use and report the amount and frequency you use these medications when you come to your office visit.   We would also advise that if you are using narcotic medications, you should take a dose prior to therapy to maximize you participation.  IF YOU ARE ON NARCOTIC MEDICATIONS IT IS NOT PERMISSIBLE TO OPERATE A MOTOR VEHICLE (MOTORCYCLE/CAR/TRUCK/MOPED) OR HEAVY MACHINERY DO NOT MIX NARCOTICS WITH OTHER CNS (CENTRAL NERVOUS SYSTEM) DEPRESSANTS SUCH AS ALCOHOL   STOP SMOKING OR USING NICOTINE PRODUCTS!!!!  As discussed nicotine severely impairs  your body's ability to heal surgical and traumatic wounds but also impairs bone healing.  Wounds and bone heal by forming microscopic blood vessels (angiogenesis) and nicotine is a vasoconstrictor (essentially, shrinks blood vessels).  Therefore, if vasoconstriction occurs to these microscopic blood vessels they essentially disappear and are unable to deliver necessary nutrients to the healing tissue.  This is one modifiable factor that you can do to dramatically increase your chances of healing your injury.    (This means no smoking, no nicotine gum, patches, etc)  ICE AND ELEVATE INJURED/OPERATIVE EXTREMITY  Using ice and elevating the injured extremity above your heart can help with swelling and pain control.  Icing in a pulsatile fashion, such as 20 minutes on and 20 minutes off, can be followed.    Do not place ice directly on skin. Make sure there is a barrier between to skin and the ice pack.    Using frozen items such as frozen peas works well as the conform nicely to the are that needs to be iced.  USE AN ACE WRAP OR TED HOSE FOR SWELLING CONTROL  In addition to icing and elevation, Ace wraps or TED hose are used to help limit and resolve swelling.  It is recommended to use Ace wraps or TED hose until you are informed to stop.    When using Ace Wraps start the wrapping distally (farthest away from the body) and wrap proximally (closer to the body)   Example: If you had surgery on your leg or thing and you do not have a splint on, start the ace wrap at the toes and work your way up  to the thigh        If you had surgery on your upper extremity and do not have a splint on, start the ace wrap at your fingers and work your way up to the upper arm   [place]: [phone]   VISIT OUR WEBSITE FOR ADDITIONAL INFORMATION: [email or web address]     Discharge Wound Care Instructions  Do NOT apply any ointments, solutions or lotions to pin sites or surgical  wounds.  These prevent needed drainage and even though solutions like hydrogen peroxide kill bacteria, they also damage cells lining the pin sites that help fight infection.  Applying lotions or ointments can keep the wounds moist and can cause them to breakdown and open up as well. This can increase the risk for infection. When in doubt call the office.  If any drainage is noted, use one layer of adaptic or Mepitel, then gauze, Kerlix, and an ace wrap. - These dressing supplies should be available at local medical supply stores [facility], [facility], etc) as well as Management consultant ([facility], [facility], [place], etc)  Once the incision is completely dry and without drainage, it may be left open to air out.  Showering may begin 36-48 hours later.  Cleaning gently with soap and water.

## 2022-08-17 NOTE — Op Note (Signed)
Orthopaedic Surgery Operative Note (CSN: [id number] ) Date of Surgery: 08/17/2022  Admit Date: 08/17/2022   Diagnoses: Pre-Op Diagnoses: Healed left bicondylar tibial plateau fracture Painful orthopaedic hardware  Post-Op Diagnosis: Same  Procedures: CPT 20680-Removal of hardware from left proximal tibia  Surgeons : Primary: Shona Needles, MD  Assistant: Patrecia Pace, PA-C  Location: OR 3   Anesthesia:General   Antibiotics: Ancef 2g preop with 1 gm vancomycin powder placed topically   Tourniquet time: None used    Estimated Blood Loss: 50 mL  Complications:None   Specimens:None   Implants: Implant Name Type Inv. Item Serial No. Manufacturer Lot No. LRB No. Used Action  PLATE PROX TIBIA LEFT 8H - [id number] Plate PLATE PROX TIBIA LEFT 8H  [employer]  Left 1 Explanted  PROS LCP PLATE 6H 26VZ - [id number] Plate PROS LCP PLATE 6H 41OI  [employer]  Left 1 Explanted  SCREW CORTEX 3.5 26MM - [id number] Screw SCREW LOCK CORT ST 3.5X26  [employer]  Left 1 Explanted  SCREW CORTEX 3.5 28MM - [id number] Screw SCREW LOCK CORT ST 3.5X28  [employer]  Left 3 Explanted  SCREW CORTEX 3.5 55MM - [id number] Screw SCREW CORTEX 3.5 55MM  [employer]  Left 1 Explanted  SCREW CORTEX 3.5 34MM - [id number] Screw SCREW LOCK CORT ST 3.5X34  [employer]  Left 1 Explanted  SCREW CORTEX 3.5 45MM - [id number] Screw SCREW CORTEX 3.5 45MM  [employer]  Left 1 Explanted  SCREW CORTEX 3.5X75MM - [id number] Screw SCREW CORTEX 3.5X75MM  [employer]  Left 1 Explanted  SCREW LOCKING 3.5X45 - [id number] Screw SCREW LOCKING 3.5X45  [employer]  Left 1 Explanted  SCREW LOCKING 3.5X55 - [id number] Screw SCREW LOCKING 3.5X55  [employer]  Left 1 Explanted  SCREW LOCKING 3.5X70MM VA - [id number] Screw SCREW LOCKING 3.5X70MM VA  [employer]  Left 1 Explanted  SCREW LOCKING VA 3.5X75MM - [id number] Screw SCREW LOCKING VA 3.5X75MM  [employer]   Left 2 Explanted  SCREW VA-LOCKING 65MM 3.5 - [id number] Screw SCREW VA-LOCKING 65MM 3.5  [employer]  Left 1 Explanted     Indications for Surgery: 66 year old female who sustained a left bicondylar tibial plateau fracture that underwent open reduction internal fixation in June 2022.  She subsequently continued to have pain and discomfort in her knee.  It was felt that it was related to the hardware that was in place.  Risk and benefits were discussed regarding hardware removal.  Risks included but not limited to bleeding, infection, persistent pain, nerve or blood vessel injury, posttraumatic arthritis, stiffness, even the possibility anesthetic complications.  She agreed to proceed with surgery and consent was obtained.  Operative Findings: Successful removal of hardware from left proximal tibia without complication.  Procedure: The patient was identified in the preoperative holding area. Consent was confirmed with the patient and their family and all questions were answered. The operative extremity was marked after confirmation with the patient. she was then brought back to the operating room by our anesthesia colleagues.  She was carefully transferred over to radiolucent flat top table.  She was placed under general anesthetic.  The left lower extremity was then prepped and draped in usual sterile fashion.  A timeout was performed to verify the patient, the procedure, and the extremity.  Preoperative antibiotics were dosed.  Fluoroscopic imaging showed the hardware that was in place.  I went through the previous lateral parapatellar incision and carried it down through skin and subcutaneous tissue.  I  incised through the IT band and expose the plate.  Here I was able to remove of the proximal screws from the plate.  I then percutaneously removed the screws from the lateral portion of the shaft.  I was then able to elevate the plate off of the bone with a Cobb elevator and remove it without  significant difficulty.  I then found the screw that was holding the condyles together that was removed.  I was then able to identify the K wires that were used for provisional reduction of the joint and these were removed as well.  I then turned my attention to the medial side.  I opened up the previous medial incision and carried it down through skin and subcutaneous tissue.  I incised through the fascia and visualized the plate.  I then removed all the screws and used a Cobb elevator to elevate the plate off the bone.  I then was able to remove the plate.  Final fluoroscopic imaging was obtained to show that all the hardware had been removed.  The incisions were copiously irrigated.  A gram of vancomycin powder was placed into the incisions.  A layered closure of 0 Vicryl, 2-0 Vicryl and 3-0 Monocryl with Dermabond was used to close the skin.  Half percent Marcaine with epinephrine was used to anesthetize the incisions.  Sterile dressing was applied.  The patient was then awoke from anesthesia and taken to the PACU in stable condition.  Post Op Plan/Instructions: The patient be weightbearing as tolerated to the left lower extremity.  She will receive 81 mg aspirin for DVT prophylaxis.  She will discharge home from the PACU.  I was present and performed the entire surgery.  Patrecia Pace, PA-C did assist me throughout the case. An assistant was necessary given the difficulty in approach, maintenance of reduction and ability to instrument the fracture.   Katha Hamming, MD [facility]

## 2022-08-20 ENCOUNTER — Encounter (HOSPITAL_COMMUNITY): Payer: Self-pay | Admitting: Student

## 2022-09-11 ENCOUNTER — Other Ambulatory Visit (HOSPITAL_BASED_OUTPATIENT_CLINIC_OR_DEPARTMENT_OTHER): Payer: Self-pay

## 2022-09-11 DIAGNOSIS — T8484XD Pain due to internal orthopedic prosthetic devices, implants and grafts, subsequent encounter: Secondary | ICD-10-CM | POA: Diagnosis not present

## 2022-09-11 DIAGNOSIS — S82142D Displaced bicondylar fracture of left tibia, subsequent encounter for closed fracture with routine healing: Secondary | ICD-10-CM | POA: Diagnosis not present

## 2022-09-11 DIAGNOSIS — S83105D Unspecified dislocation of left knee, subsequent encounter: Secondary | ICD-10-CM | POA: Diagnosis not present

## 2022-09-11 MED ORDER — FLUAD QUADRIVALENT 0.5 ML IM PRSY
PREFILLED_SYRINGE | INTRAMUSCULAR | 0 refills | Status: AC
  Filled 2022-09-11: qty 0.5, 1d supply, fill #0

## 2022-09-14 DIAGNOSIS — M23232 Derangement of other medial meniscus due to old tear or injury, left knee: Secondary | ICD-10-CM | POA: Diagnosis not present

## 2022-09-14 DIAGNOSIS — M25562 Pain in left knee: Secondary | ICD-10-CM | POA: Diagnosis not present

## 2022-09-14 DIAGNOSIS — M25662 Stiffness of left knee, not elsewhere classified: Secondary | ICD-10-CM | POA: Diagnosis not present

## 2022-09-14 DIAGNOSIS — R262 Difficulty in walking, not elsewhere classified: Secondary | ICD-10-CM | POA: Diagnosis not present

## 2022-09-14 DIAGNOSIS — Z4889 Encounter for other specified surgical aftercare: Secondary | ICD-10-CM | POA: Diagnosis not present

## 2022-09-17 DIAGNOSIS — M25662 Stiffness of left knee, not elsewhere classified: Secondary | ICD-10-CM | POA: Diagnosis not present

## 2022-09-17 DIAGNOSIS — Z4889 Encounter for other specified surgical aftercare: Secondary | ICD-10-CM | POA: Diagnosis not present

## 2022-09-17 DIAGNOSIS — M23232 Derangement of other medial meniscus due to old tear or injury, left knee: Secondary | ICD-10-CM | POA: Diagnosis not present

## 2022-09-17 DIAGNOSIS — M25562 Pain in left knee: Secondary | ICD-10-CM | POA: Diagnosis not present

## 2022-09-17 DIAGNOSIS — R262 Difficulty in walking, not elsewhere classified: Secondary | ICD-10-CM | POA: Diagnosis not present

## 2022-09-20 DIAGNOSIS — M25562 Pain in left knee: Secondary | ICD-10-CM | POA: Diagnosis not present

## 2022-09-20 DIAGNOSIS — R262 Difficulty in walking, not elsewhere classified: Secondary | ICD-10-CM | POA: Diagnosis not present

## 2022-09-20 DIAGNOSIS — M25662 Stiffness of left knee, not elsewhere classified: Secondary | ICD-10-CM | POA: Diagnosis not present

## 2022-09-20 DIAGNOSIS — M23232 Derangement of other medial meniscus due to old tear or injury, left knee: Secondary | ICD-10-CM | POA: Diagnosis not present

## 2022-09-20 DIAGNOSIS — Z4889 Encounter for other specified surgical aftercare: Secondary | ICD-10-CM | POA: Diagnosis not present

## 2022-09-24 DIAGNOSIS — M25562 Pain in left knee: Secondary | ICD-10-CM | POA: Diagnosis not present

## 2022-09-24 DIAGNOSIS — M25662 Stiffness of left knee, not elsewhere classified: Secondary | ICD-10-CM | POA: Diagnosis not present

## 2022-09-24 DIAGNOSIS — R262 Difficulty in walking, not elsewhere classified: Secondary | ICD-10-CM | POA: Diagnosis not present

## 2022-09-24 DIAGNOSIS — Z4889 Encounter for other specified surgical aftercare: Secondary | ICD-10-CM | POA: Diagnosis not present

## 2022-09-24 DIAGNOSIS — M23232 Derangement of other medial meniscus due to old tear or injury, left knee: Secondary | ICD-10-CM | POA: Diagnosis not present

## 2022-09-27 DIAGNOSIS — M25662 Stiffness of left knee, not elsewhere classified: Secondary | ICD-10-CM | POA: Diagnosis not present

## 2022-09-27 DIAGNOSIS — M23232 Derangement of other medial meniscus due to old tear or injury, left knee: Secondary | ICD-10-CM | POA: Diagnosis not present

## 2022-09-27 DIAGNOSIS — Z4889 Encounter for other specified surgical aftercare: Secondary | ICD-10-CM | POA: Diagnosis not present

## 2022-09-27 DIAGNOSIS — R262 Difficulty in walking, not elsewhere classified: Secondary | ICD-10-CM | POA: Diagnosis not present

## 2022-09-27 DIAGNOSIS — M25562 Pain in left knee: Secondary | ICD-10-CM | POA: Diagnosis not present

## 2022-10-01 DIAGNOSIS — R262 Difficulty in walking, not elsewhere classified: Secondary | ICD-10-CM | POA: Diagnosis not present

## 2022-10-01 DIAGNOSIS — M25562 Pain in left knee: Secondary | ICD-10-CM | POA: Diagnosis not present

## 2022-10-01 DIAGNOSIS — M25662 Stiffness of left knee, not elsewhere classified: Secondary | ICD-10-CM | POA: Diagnosis not present

## 2022-10-01 DIAGNOSIS — M23232 Derangement of other medial meniscus due to old tear or injury, left knee: Secondary | ICD-10-CM | POA: Diagnosis not present

## 2022-10-01 DIAGNOSIS — Z4889 Encounter for other specified surgical aftercare: Secondary | ICD-10-CM | POA: Diagnosis not present

## 2022-10-04 DIAGNOSIS — M25662 Stiffness of left knee, not elsewhere classified: Secondary | ICD-10-CM | POA: Diagnosis not present

## 2022-10-04 DIAGNOSIS — M25562 Pain in left knee: Secondary | ICD-10-CM | POA: Diagnosis not present

## 2022-10-04 DIAGNOSIS — R262 Difficulty in walking, not elsewhere classified: Secondary | ICD-10-CM | POA: Diagnosis not present

## 2022-10-04 DIAGNOSIS — M23232 Derangement of other medial meniscus due to old tear or injury, left knee: Secondary | ICD-10-CM | POA: Diagnosis not present

## 2022-10-04 DIAGNOSIS — Z4889 Encounter for other specified surgical aftercare: Secondary | ICD-10-CM | POA: Diagnosis not present

## 2022-10-11 DIAGNOSIS — M25562 Pain in left knee: Secondary | ICD-10-CM | POA: Diagnosis not present

## 2022-10-11 DIAGNOSIS — Z4889 Encounter for other specified surgical aftercare: Secondary | ICD-10-CM | POA: Diagnosis not present

## 2022-10-11 DIAGNOSIS — M23232 Derangement of other medial meniscus due to old tear or injury, left knee: Secondary | ICD-10-CM | POA: Diagnosis not present

## 2022-10-11 DIAGNOSIS — M25662 Stiffness of left knee, not elsewhere classified: Secondary | ICD-10-CM | POA: Diagnosis not present

## 2022-10-11 DIAGNOSIS — R262 Difficulty in walking, not elsewhere classified: Secondary | ICD-10-CM | POA: Diagnosis not present

## 2022-10-15 DIAGNOSIS — M25662 Stiffness of left knee, not elsewhere classified: Secondary | ICD-10-CM | POA: Diagnosis not present

## 2022-10-15 DIAGNOSIS — Z4889 Encounter for other specified surgical aftercare: Secondary | ICD-10-CM | POA: Diagnosis not present

## 2022-10-15 DIAGNOSIS — M23232 Derangement of other medial meniscus due to old tear or injury, left knee: Secondary | ICD-10-CM | POA: Diagnosis not present

## 2022-10-15 DIAGNOSIS — R262 Difficulty in walking, not elsewhere classified: Secondary | ICD-10-CM | POA: Diagnosis not present

## 2022-10-15 DIAGNOSIS — M25562 Pain in left knee: Secondary | ICD-10-CM | POA: Diagnosis not present

## 2022-10-18 DIAGNOSIS — M25562 Pain in left knee: Secondary | ICD-10-CM | POA: Diagnosis not present

## 2022-10-18 DIAGNOSIS — Z4889 Encounter for other specified surgical aftercare: Secondary | ICD-10-CM | POA: Diagnosis not present

## 2022-10-18 DIAGNOSIS — M25662 Stiffness of left knee, not elsewhere classified: Secondary | ICD-10-CM | POA: Diagnosis not present

## 2022-10-18 DIAGNOSIS — M23232 Derangement of other medial meniscus due to old tear or injury, left knee: Secondary | ICD-10-CM | POA: Diagnosis not present

## 2022-10-18 DIAGNOSIS — R262 Difficulty in walking, not elsewhere classified: Secondary | ICD-10-CM | POA: Diagnosis not present

## 2022-10-22 ENCOUNTER — Ambulatory Visit: Payer: Self-pay

## 2022-10-22 DIAGNOSIS — M25562 Pain in left knee: Secondary | ICD-10-CM | POA: Diagnosis not present

## 2022-10-22 DIAGNOSIS — M25662 Stiffness of left knee, not elsewhere classified: Secondary | ICD-10-CM | POA: Diagnosis not present

## 2022-10-22 DIAGNOSIS — Z4889 Encounter for other specified surgical aftercare: Secondary | ICD-10-CM | POA: Diagnosis not present

## 2022-10-22 DIAGNOSIS — R262 Difficulty in walking, not elsewhere classified: Secondary | ICD-10-CM | POA: Diagnosis not present

## 2022-10-22 DIAGNOSIS — M23232 Derangement of other medial meniscus due to old tear or injury, left knee: Secondary | ICD-10-CM | POA: Diagnosis not present

## 2022-10-22 NOTE — Patient Outreach (Signed)
  Care Coordination   10/22/2022 Name: Julie Moran MRN: [id number] DOB: October 23, 1956   Care Coordination Outreach Attempts:  An unsuccessful telephone outreach was attempted today to offer the patient information about available care coordination services as a benefit of their health plan.   Follow Up Plan:  Additional outreach attempts will be made to offer the patient care coordination information and services.   Encounter Outcome:  No Answer  Care Coordination Interventions Activated:  No   Care Coordination Interventions:  No, not indicated    Daneen Schick, BSW, CDP Social Worker, Certified Dementia Practitioner [facility] Care Management  Care Coordination [phone]

## 2022-10-25 DIAGNOSIS — M23232 Derangement of other medial meniscus due to old tear or injury, left knee: Secondary | ICD-10-CM | POA: Diagnosis not present

## 2022-10-25 DIAGNOSIS — R262 Difficulty in walking, not elsewhere classified: Secondary | ICD-10-CM | POA: Diagnosis not present

## 2022-10-25 DIAGNOSIS — M25662 Stiffness of left knee, not elsewhere classified: Secondary | ICD-10-CM | POA: Diagnosis not present

## 2022-10-25 DIAGNOSIS — Z4889 Encounter for other specified surgical aftercare: Secondary | ICD-10-CM | POA: Diagnosis not present

## 2022-10-25 DIAGNOSIS — M25562 Pain in left knee: Secondary | ICD-10-CM | POA: Diagnosis not present

## 2022-10-29 DIAGNOSIS — M25562 Pain in left knee: Secondary | ICD-10-CM | POA: Diagnosis not present

## 2022-10-29 DIAGNOSIS — Z4889 Encounter for other specified surgical aftercare: Secondary | ICD-10-CM | POA: Diagnosis not present

## 2022-10-29 DIAGNOSIS — R262 Difficulty in walking, not elsewhere classified: Secondary | ICD-10-CM | POA: Diagnosis not present

## 2022-10-29 DIAGNOSIS — M23232 Derangement of other medial meniscus due to old tear or injury, left knee: Secondary | ICD-10-CM | POA: Diagnosis not present

## 2022-10-29 DIAGNOSIS — M25662 Stiffness of left knee, not elsewhere classified: Secondary | ICD-10-CM | POA: Diagnosis not present

## 2022-11-05 DIAGNOSIS — Z4889 Encounter for other specified surgical aftercare: Secondary | ICD-10-CM | POA: Diagnosis not present

## 2022-11-05 DIAGNOSIS — R262 Difficulty in walking, not elsewhere classified: Secondary | ICD-10-CM | POA: Diagnosis not present

## 2022-11-05 DIAGNOSIS — M25662 Stiffness of left knee, not elsewhere classified: Secondary | ICD-10-CM | POA: Diagnosis not present

## 2022-11-05 DIAGNOSIS — M25562 Pain in left knee: Secondary | ICD-10-CM | POA: Diagnosis not present

## 2022-11-05 DIAGNOSIS — M23232 Derangement of other medial meniscus due to old tear or injury, left knee: Secondary | ICD-10-CM | POA: Diagnosis not present

## 2022-11-07 ENCOUNTER — Ambulatory Visit: Payer: Self-pay

## 2022-11-07 NOTE — Patient Outreach (Signed)
  Care Coordination   11/07/2022 Name: Julie Moran MRN: [id number] DOB: Jun 24, 1956   Care Coordination Outreach Attempts:  A second unsuccessful outreach was attempted today to offer the patient with information about available care coordination services as a benefit of their health plan.     Follow Up Plan:  Additional outreach attempts will be made to offer the patient care coordination information and services.   Encounter Outcome:  No Answer   Care Coordination Interventions:  No, not indicated    Daneen Schick, BSW, CDP Social Worker, Certified Dementia Practitioner [place] Management  Care Coordination [phone]

## 2022-11-08 DIAGNOSIS — M25662 Stiffness of left knee, not elsewhere classified: Secondary | ICD-10-CM | POA: Diagnosis not present

## 2022-11-08 DIAGNOSIS — Z4889 Encounter for other specified surgical aftercare: Secondary | ICD-10-CM | POA: Diagnosis not present

## 2022-11-08 DIAGNOSIS — R262 Difficulty in walking, not elsewhere classified: Secondary | ICD-10-CM | POA: Diagnosis not present

## 2022-11-08 DIAGNOSIS — M23232 Derangement of other medial meniscus due to old tear or injury, left knee: Secondary | ICD-10-CM | POA: Diagnosis not present

## 2022-11-08 DIAGNOSIS — M25562 Pain in left knee: Secondary | ICD-10-CM | POA: Diagnosis not present

## 2022-11-12 DIAGNOSIS — M23232 Derangement of other medial meniscus due to old tear or injury, left knee: Secondary | ICD-10-CM | POA: Diagnosis not present

## 2022-11-12 DIAGNOSIS — Z4889 Encounter for other specified surgical aftercare: Secondary | ICD-10-CM | POA: Diagnosis not present

## 2022-11-12 DIAGNOSIS — R262 Difficulty in walking, not elsewhere classified: Secondary | ICD-10-CM | POA: Diagnosis not present

## 2022-11-12 DIAGNOSIS — M25562 Pain in left knee: Secondary | ICD-10-CM | POA: Diagnosis not present

## 2022-11-12 DIAGNOSIS — M25662 Stiffness of left knee, not elsewhere classified: Secondary | ICD-10-CM | POA: Diagnosis not present

## 2022-11-19 DIAGNOSIS — Z4889 Encounter for other specified surgical aftercare: Secondary | ICD-10-CM | POA: Diagnosis not present

## 2022-11-19 DIAGNOSIS — M23232 Derangement of other medial meniscus due to old tear or injury, left knee: Secondary | ICD-10-CM | POA: Diagnosis not present

## 2022-11-19 DIAGNOSIS — R262 Difficulty in walking, not elsewhere classified: Secondary | ICD-10-CM | POA: Diagnosis not present

## 2022-11-19 DIAGNOSIS — M25662 Stiffness of left knee, not elsewhere classified: Secondary | ICD-10-CM | POA: Diagnosis not present

## 2022-11-19 DIAGNOSIS — M25562 Pain in left knee: Secondary | ICD-10-CM | POA: Diagnosis not present

## 2022-11-22 DIAGNOSIS — R262 Difficulty in walking, not elsewhere classified: Secondary | ICD-10-CM | POA: Diagnosis not present

## 2022-11-22 DIAGNOSIS — M25662 Stiffness of left knee, not elsewhere classified: Secondary | ICD-10-CM | POA: Diagnosis not present

## 2022-11-22 DIAGNOSIS — M23232 Derangement of other medial meniscus due to old tear or injury, left knee: Secondary | ICD-10-CM | POA: Diagnosis not present

## 2022-11-22 DIAGNOSIS — M25562 Pain in left knee: Secondary | ICD-10-CM | POA: Diagnosis not present

## 2022-11-22 DIAGNOSIS — Z4889 Encounter for other specified surgical aftercare: Secondary | ICD-10-CM | POA: Diagnosis not present

## 2022-11-29 DIAGNOSIS — M25662 Stiffness of left knee, not elsewhere classified: Secondary | ICD-10-CM | POA: Diagnosis not present

## 2022-11-29 DIAGNOSIS — M25562 Pain in left knee: Secondary | ICD-10-CM | POA: Diagnosis not present

## 2022-11-29 DIAGNOSIS — Z4889 Encounter for other specified surgical aftercare: Secondary | ICD-10-CM | POA: Diagnosis not present

## 2022-11-29 DIAGNOSIS — R262 Difficulty in walking, not elsewhere classified: Secondary | ICD-10-CM | POA: Diagnosis not present

## 2022-11-29 DIAGNOSIS — M23232 Derangement of other medial meniscus due to old tear or injury, left knee: Secondary | ICD-10-CM | POA: Diagnosis not present

## 2022-12-06 DIAGNOSIS — M23232 Derangement of other medial meniscus due to old tear or injury, left knee: Secondary | ICD-10-CM | POA: Diagnosis not present

## 2022-12-06 DIAGNOSIS — Z4889 Encounter for other specified surgical aftercare: Secondary | ICD-10-CM | POA: Diagnosis not present

## 2022-12-06 DIAGNOSIS — R262 Difficulty in walking, not elsewhere classified: Secondary | ICD-10-CM | POA: Diagnosis not present

## 2022-12-06 DIAGNOSIS — M25662 Stiffness of left knee, not elsewhere classified: Secondary | ICD-10-CM | POA: Diagnosis not present

## 2022-12-06 DIAGNOSIS — M25562 Pain in left knee: Secondary | ICD-10-CM | POA: Diagnosis not present

## 2022-12-11 DIAGNOSIS — M25662 Stiffness of left knee, not elsewhere classified: Secondary | ICD-10-CM | POA: Diagnosis not present

## 2022-12-11 DIAGNOSIS — M23232 Derangement of other medial meniscus due to old tear or injury, left knee: Secondary | ICD-10-CM | POA: Diagnosis not present

## 2022-12-11 DIAGNOSIS — M25562 Pain in left knee: Secondary | ICD-10-CM | POA: Diagnosis not present

## 2022-12-11 DIAGNOSIS — R262 Difficulty in walking, not elsewhere classified: Secondary | ICD-10-CM | POA: Diagnosis not present

## 2022-12-11 DIAGNOSIS — Z4889 Encounter for other specified surgical aftercare: Secondary | ICD-10-CM | POA: Diagnosis not present

## 2022-12-13 DIAGNOSIS — M23232 Derangement of other medial meniscus due to old tear or injury, left knee: Secondary | ICD-10-CM | POA: Diagnosis not present

## 2022-12-13 DIAGNOSIS — M25562 Pain in left knee: Secondary | ICD-10-CM | POA: Diagnosis not present

## 2022-12-13 DIAGNOSIS — R262 Difficulty in walking, not elsewhere classified: Secondary | ICD-10-CM | POA: Diagnosis not present

## 2022-12-13 DIAGNOSIS — M25662 Stiffness of left knee, not elsewhere classified: Secondary | ICD-10-CM | POA: Diagnosis not present

## 2022-12-13 DIAGNOSIS — Z4889 Encounter for other specified surgical aftercare: Secondary | ICD-10-CM | POA: Diagnosis not present

## 2022-12-17 DIAGNOSIS — R262 Difficulty in walking, not elsewhere classified: Secondary | ICD-10-CM | POA: Diagnosis not present

## 2022-12-17 DIAGNOSIS — M25562 Pain in left knee: Secondary | ICD-10-CM | POA: Diagnosis not present

## 2022-12-17 DIAGNOSIS — M25662 Stiffness of left knee, not elsewhere classified: Secondary | ICD-10-CM | POA: Diagnosis not present

## 2022-12-17 DIAGNOSIS — M23232 Derangement of other medial meniscus due to old tear or injury, left knee: Secondary | ICD-10-CM | POA: Diagnosis not present

## 2022-12-17 DIAGNOSIS — Z4889 Encounter for other specified surgical aftercare: Secondary | ICD-10-CM | POA: Diagnosis not present

## 2022-12-20 DIAGNOSIS — Z4889 Encounter for other specified surgical aftercare: Secondary | ICD-10-CM | POA: Diagnosis not present

## 2022-12-20 DIAGNOSIS — R262 Difficulty in walking, not elsewhere classified: Secondary | ICD-10-CM | POA: Diagnosis not present

## 2022-12-20 DIAGNOSIS — M23232 Derangement of other medial meniscus due to old tear or injury, left knee: Secondary | ICD-10-CM | POA: Diagnosis not present

## 2022-12-20 DIAGNOSIS — M25562 Pain in left knee: Secondary | ICD-10-CM | POA: Diagnosis not present

## 2022-12-20 DIAGNOSIS — M25662 Stiffness of left knee, not elsewhere classified: Secondary | ICD-10-CM | POA: Diagnosis not present

## 2022-12-24 DIAGNOSIS — R262 Difficulty in walking, not elsewhere classified: Secondary | ICD-10-CM | POA: Diagnosis not present

## 2022-12-24 DIAGNOSIS — Z4889 Encounter for other specified surgical aftercare: Secondary | ICD-10-CM | POA: Diagnosis not present

## 2022-12-24 DIAGNOSIS — M25562 Pain in left knee: Secondary | ICD-10-CM | POA: Diagnosis not present

## 2022-12-24 DIAGNOSIS — M23232 Derangement of other medial meniscus due to old tear or injury, left knee: Secondary | ICD-10-CM | POA: Diagnosis not present

## 2022-12-24 DIAGNOSIS — M25662 Stiffness of left knee, not elsewhere classified: Secondary | ICD-10-CM | POA: Diagnosis not present

## 2022-12-25 DIAGNOSIS — E785 Hyperlipidemia, unspecified: Secondary | ICD-10-CM | POA: Diagnosis not present

## 2022-12-25 DIAGNOSIS — I10 Essential (primary) hypertension: Secondary | ICD-10-CM | POA: Diagnosis not present

## 2022-12-31 DIAGNOSIS — M25562 Pain in left knee: Secondary | ICD-10-CM | POA: Diagnosis not present

## 2022-12-31 DIAGNOSIS — R262 Difficulty in walking, not elsewhere classified: Secondary | ICD-10-CM | POA: Diagnosis not present

## 2022-12-31 DIAGNOSIS — M25662 Stiffness of left knee, not elsewhere classified: Secondary | ICD-10-CM | POA: Diagnosis not present

## 2022-12-31 DIAGNOSIS — Z4889 Encounter for other specified surgical aftercare: Secondary | ICD-10-CM | POA: Diagnosis not present

## 2022-12-31 DIAGNOSIS — M23232 Derangement of other medial meniscus due to old tear or injury, left knee: Secondary | ICD-10-CM | POA: Diagnosis not present

## 2023-01-07 DIAGNOSIS — H52203 Unspecified astigmatism, bilateral: Secondary | ICD-10-CM | POA: Diagnosis not present

## 2023-01-07 DIAGNOSIS — M25662 Stiffness of left knee, not elsewhere classified: Secondary | ICD-10-CM | POA: Diagnosis not present

## 2023-01-07 DIAGNOSIS — M25562 Pain in left knee: Secondary | ICD-10-CM | POA: Diagnosis not present

## 2023-01-07 DIAGNOSIS — R262 Difficulty in walking, not elsewhere classified: Secondary | ICD-10-CM | POA: Diagnosis not present

## 2023-01-07 DIAGNOSIS — M23232 Derangement of other medial meniscus due to old tear or injury, left knee: Secondary | ICD-10-CM | POA: Diagnosis not present

## 2023-01-07 DIAGNOSIS — H2513 Age-related nuclear cataract, bilateral: Secondary | ICD-10-CM | POA: Diagnosis not present

## 2023-01-07 DIAGNOSIS — Z4889 Encounter for other specified surgical aftercare: Secondary | ICD-10-CM | POA: Diagnosis not present

## 2023-01-10 DIAGNOSIS — M23232 Derangement of other medial meniscus due to old tear or injury, left knee: Secondary | ICD-10-CM | POA: Diagnosis not present

## 2023-01-10 DIAGNOSIS — M25562 Pain in left knee: Secondary | ICD-10-CM | POA: Diagnosis not present

## 2023-01-10 DIAGNOSIS — M25662 Stiffness of left knee, not elsewhere classified: Secondary | ICD-10-CM | POA: Diagnosis not present

## 2023-01-10 DIAGNOSIS — Z4889 Encounter for other specified surgical aftercare: Secondary | ICD-10-CM | POA: Diagnosis not present

## 2023-01-10 DIAGNOSIS — R262 Difficulty in walking, not elsewhere classified: Secondary | ICD-10-CM | POA: Diagnosis not present

## 2023-01-15 DIAGNOSIS — M23232 Derangement of other medial meniscus due to old tear or injury, left knee: Secondary | ICD-10-CM | POA: Diagnosis not present

## 2023-01-15 DIAGNOSIS — R262 Difficulty in walking, not elsewhere classified: Secondary | ICD-10-CM | POA: Diagnosis not present

## 2023-01-15 DIAGNOSIS — M25662 Stiffness of left knee, not elsewhere classified: Secondary | ICD-10-CM | POA: Diagnosis not present

## 2023-01-15 DIAGNOSIS — H2513 Age-related nuclear cataract, bilateral: Secondary | ICD-10-CM | POA: Diagnosis not present

## 2023-01-15 DIAGNOSIS — Z4889 Encounter for other specified surgical aftercare: Secondary | ICD-10-CM | POA: Diagnosis not present

## 2023-01-15 DIAGNOSIS — M25562 Pain in left knee: Secondary | ICD-10-CM | POA: Diagnosis not present

## 2023-01-17 DIAGNOSIS — Z4889 Encounter for other specified surgical aftercare: Secondary | ICD-10-CM | POA: Diagnosis not present

## 2023-01-17 DIAGNOSIS — M23232 Derangement of other medial meniscus due to old tear or injury, left knee: Secondary | ICD-10-CM | POA: Diagnosis not present

## 2023-01-17 DIAGNOSIS — M25662 Stiffness of left knee, not elsewhere classified: Secondary | ICD-10-CM | POA: Diagnosis not present

## 2023-01-17 DIAGNOSIS — R262 Difficulty in walking, not elsewhere classified: Secondary | ICD-10-CM | POA: Diagnosis not present

## 2023-01-17 DIAGNOSIS — M25562 Pain in left knee: Secondary | ICD-10-CM | POA: Diagnosis not present

## 2023-01-21 DIAGNOSIS — M23232 Derangement of other medial meniscus due to old tear or injury, left knee: Secondary | ICD-10-CM | POA: Diagnosis not present

## 2023-01-21 DIAGNOSIS — Z4889 Encounter for other specified surgical aftercare: Secondary | ICD-10-CM | POA: Diagnosis not present

## 2023-01-21 DIAGNOSIS — R262 Difficulty in walking, not elsewhere classified: Secondary | ICD-10-CM | POA: Diagnosis not present

## 2023-01-21 DIAGNOSIS — M25662 Stiffness of left knee, not elsewhere classified: Secondary | ICD-10-CM | POA: Diagnosis not present

## 2023-01-21 DIAGNOSIS — M25562 Pain in left knee: Secondary | ICD-10-CM | POA: Diagnosis not present

## 2023-02-03 IMAGING — MR MR ABDOMEN WO/W CM
17 series · 48 of 48 positions shown · IV contrast (multihance)
Comparison: 09/12/2019

CLINICAL DATA: Renal cyst, Felice syndrome

EXAM:
MRI ABDOMEN WITHOUT AND WITH CONTRAST
TECHNIQUE: Multiplanar multisequence MR imaging of the abdomen was performed
both before and after the administration of intravenous contrast.
CONTRAST:  15mL MULTIHANCE GADOBENATE DIMEGLUMINE 529 MG/ML IV SOLN

[Series 3: T2 · coronal · 5.0mm · 1.56mm/px · 2 of 36 slices shown (1 of 3)]
[im 1/36]
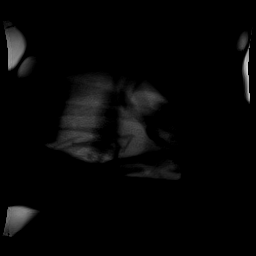
[im 36/36]
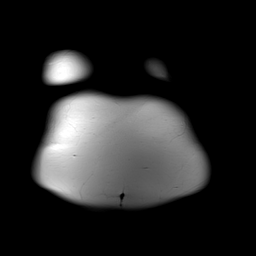

[Series 4: T1 · axial · 3.0mm · 1.25mm/px · z∈[-96,+117]mm · 7 of 144 slices shown]
[im 1/144]
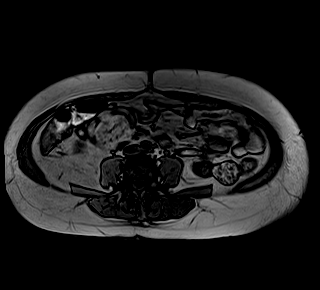
[im 24/144]
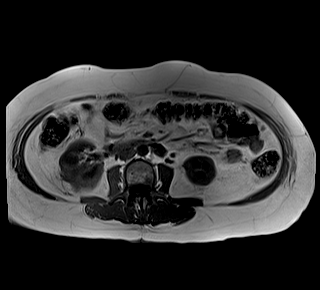
[im 48/144]
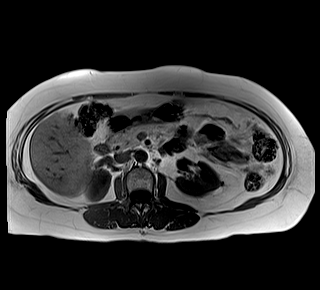
[im 72/144]
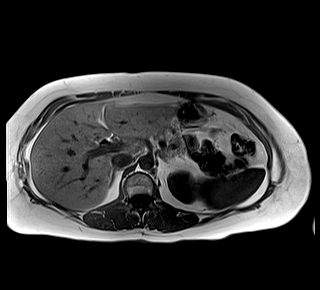
[im 96/144]
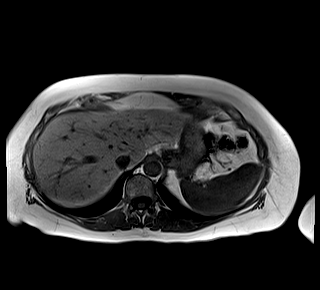
[im 120/144]
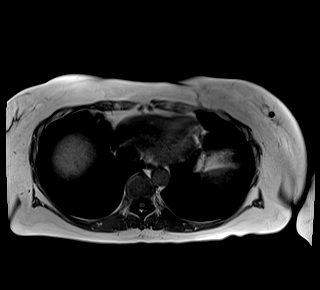
[im 144/144]
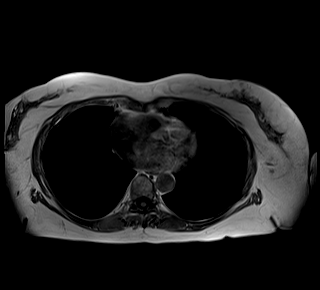

[Series 5: T2 · axial · 5.0mm · 1.48mm/px · 1 of 38 slices shown (2 of 3)]
[im 1/38]
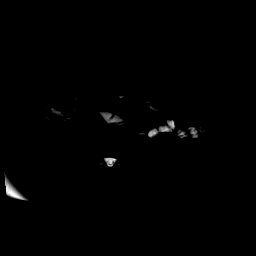

[Series 6: DWI · axial · 5.0mm · 1.42mm/px · z∈[-105,+105]mm · 4 of 108 slices shown (1 of 2)]
[im 1/108]
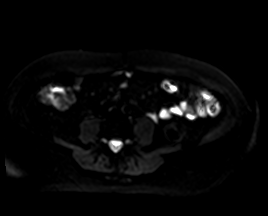
[im 36/108]
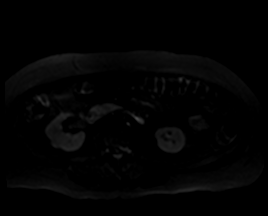
[im 72/108]
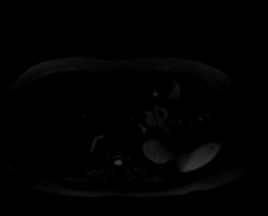
[im 108/108]
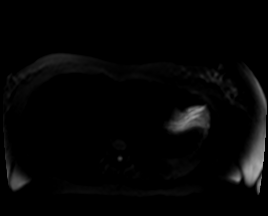

[Series 7: DWI · axial · 5.0mm · 1.42mm/px · 1 of 36 slices shown (2 of 2)]
[im 1/36]
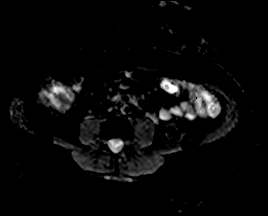

[Series 8: T2 · axial · 6.0mm · 1.19mm/px · 1 of 32 slices shown (3 of 3)]
[im 1/32]
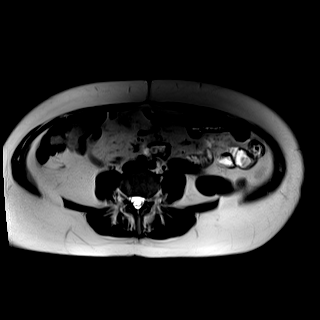

[Series 9: bSSFP · axial · 5.0mm · 1.25mm/px · z∈[-117,+117]mm · 2 of 40 slices shown]
[im 1/40]
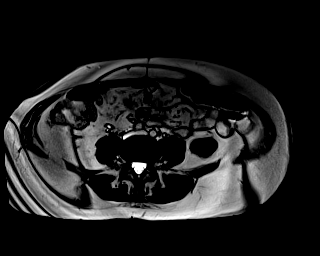
[im 40/40]
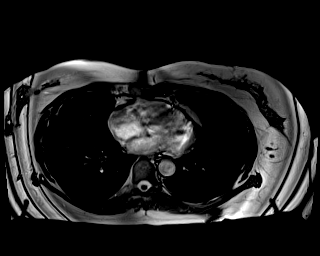

[Series 10: T1 dynamic · axial · non-contrast · 3.0mm · 1.25mm/px · z∈[-118,+119]mm · 3 of 80 slices shown]
[im 1/80]
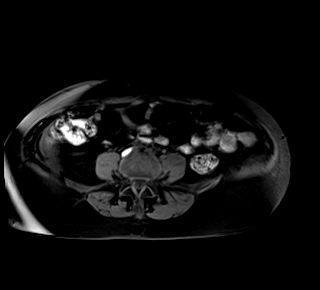
[im 40/80]
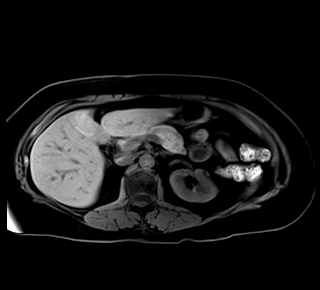
[im 80/80]
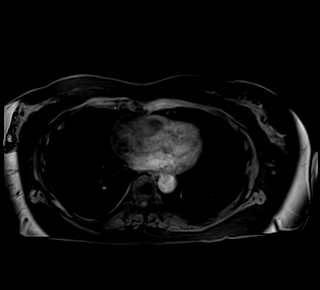

[Series 11: T1 dynamic post-contrast · axial · 3.0mm · 1.25mm/px · z∈[-118,+119]mm · 3 of 80 slices shown (1 of 9)]
[im 1/80]
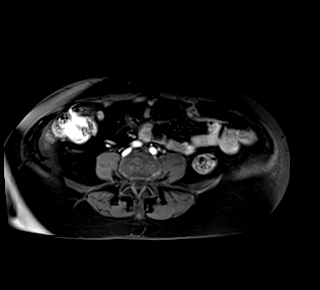
[im 40/80]
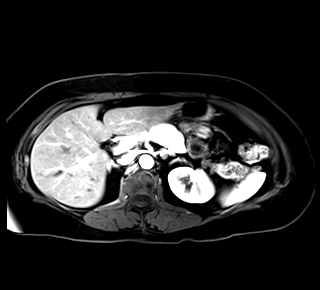
[im 80/80]
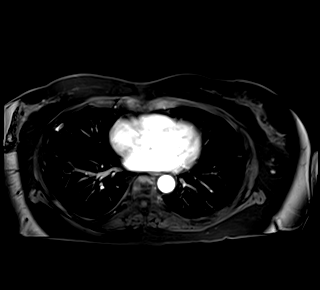

[Series 12: T1 dynamic post-contrast · axial · 3.0mm · 1.25mm/px · z∈[-118,+119]mm · 3 of 80 slices shown (2 of 9)]
[im 1/80]
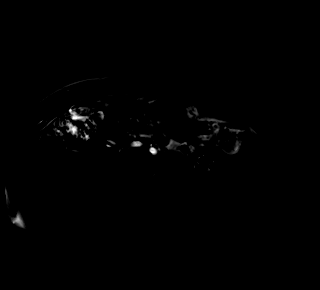
[im 40/80]
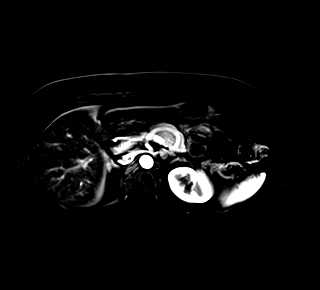
[im 80/80]
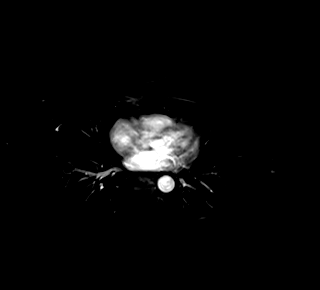

[Series 13: T1 dynamic post-contrast · axial · 3.0mm · 1.25mm/px · z∈[-118,+119]mm · 3 of 80 slices shown (3 of 9)]
[im 1/80]
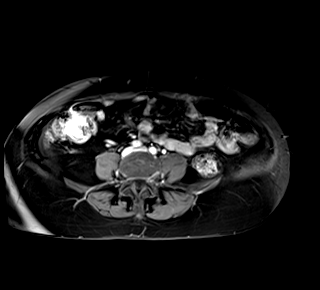
[im 40/80]
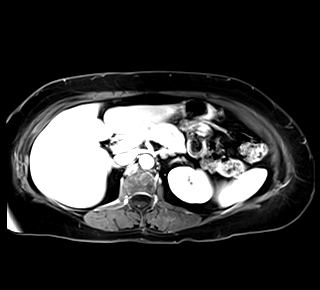
[im 80/80]
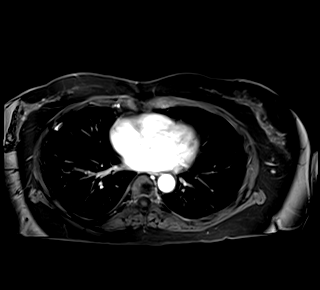

[Series 14: T1 dynamic post-contrast · axial · 3.0mm · 1.25mm/px · z∈[-118,+119]mm · 3 of 80 slices shown (4 of 9)]
[im 1/80]
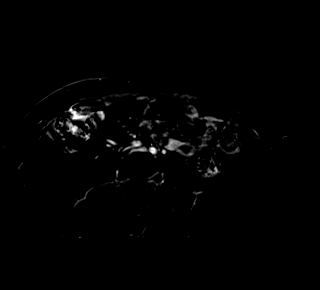
[im 40/80]
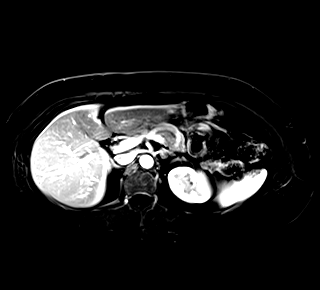
[im 80/80]
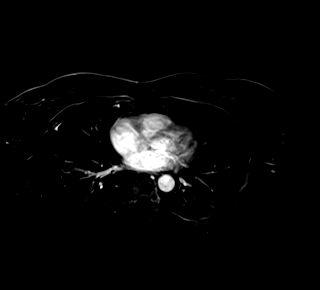

[Series 15: T1 dynamic post-contrast · axial · 3.0mm · 1.25mm/px · z∈[-118,+119]mm · 3 of 80 slices shown (5 of 9)]
[im 1/80]
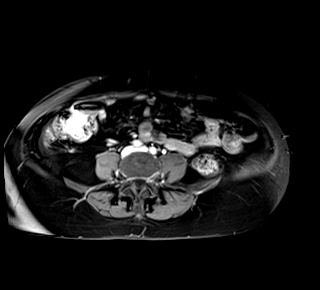
[im 40/80]
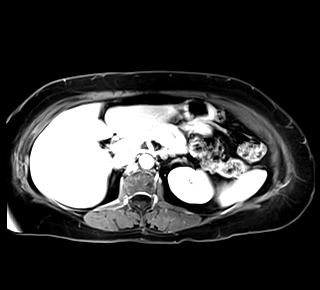
[im 80/80]
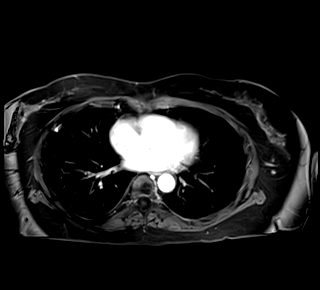

[Series 16: T1 dynamic post-contrast · axial · 3.0mm · 1.25mm/px · z∈[-118,+119]mm · 3 of 80 slices shown (6 of 9)]
[im 1/80]
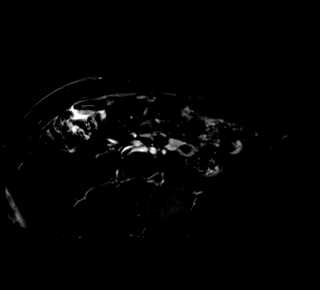
[im 40/80]
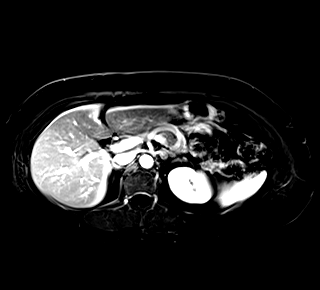
[im 80/80]
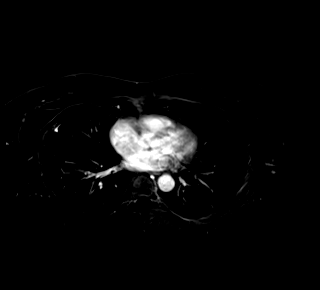

[Series 17: T1 dynamic post-contrast · coronal · 3.0mm · 1.25mm/px · 3 of 72 slices shown (7 of 9)]
[im 1/72]
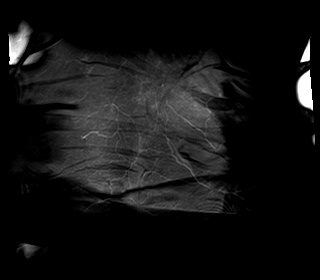
[im 36/72]
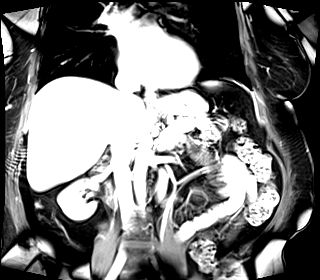
[im 72/72]
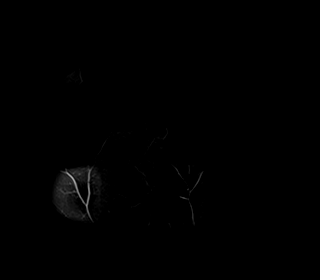

[Series 18: T1 dynamic post-contrast · axial · 3.0mm · 1.25mm/px · z∈[-118,+119]mm · 3 of 80 slices shown (8 of 9)]
[im 1/80]
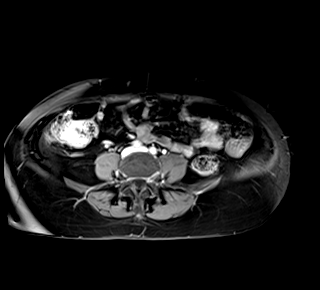
[im 40/80]
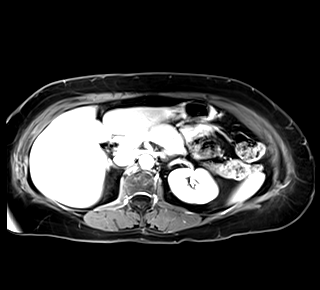
[im 80/80]
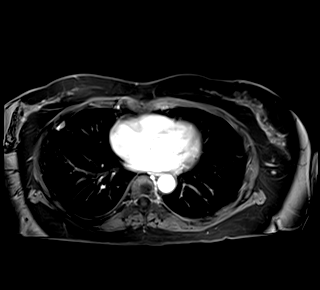

[Series 19: T1 dynamic post-contrast · axial · 3.0mm · 1.25mm/px · z∈[-118,+119]mm · 3 of 80 slices shown (9 of 9)]
[im 1/80]
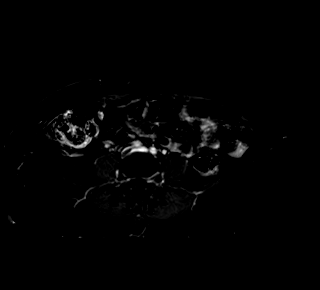
[im 40/80]
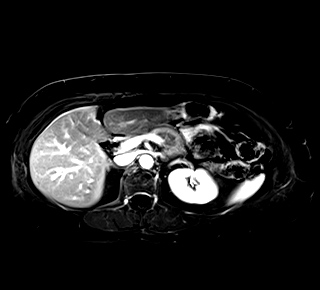
[im 80/80]
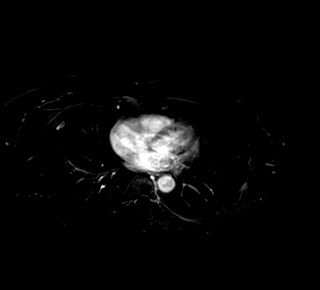

[48 of 48 positions shown; findings below may reference images not displayed]

FINDINGS: Lower chest: Lung bases are clear.

Hepatobiliary: Liver is within normal limits. No morphologic
findings of cirrhosis. No hepatic steatosis.

Status post cholecystectomy. No intrahepatic or extrahepatic ductal
dilatation.

Pancreas:  Within normal limits.

Spleen:  Within normal limits.

Adrenals/Urinary Tract:  Adrenal glands are within normal limits.

Scattered tiny cortical cysts in the kidneys bilaterally, measuring
up to 7 mm in the anterior right upper kidney (series 8/image 22),
benign (Bosniak I). No suspicious/enhancing renal lesions. No
hydronephrosis.

Stomach/Bowel: Stomach is within normal limits.

Visualized bowel is unremarkable.

Vascular/Lymphatic:  No evidence of abdominal aortic aneurysm.

No suspicious abdominal lymphadenopathy.

Other:  No abdominal ascites.

Musculoskeletal: No focal osseous lesions.
IMPRESSION: Small bilateral renal cysts measuring up to 7 mm in the anterior
right upper kidney, benign (Bosniak I). No enhancing renal lesions.

Status post cholecystectomy.

## 2023-02-14 DIAGNOSIS — H2511 Age-related nuclear cataract, right eye: Secondary | ICD-10-CM | POA: Diagnosis not present

## 2023-02-14 DIAGNOSIS — H269 Unspecified cataract: Secondary | ICD-10-CM | POA: Diagnosis not present

## 2023-03-21 DIAGNOSIS — H269 Unspecified cataract: Secondary | ICD-10-CM | POA: Diagnosis not present

## 2023-03-21 DIAGNOSIS — H2512 Age-related nuclear cataract, left eye: Secondary | ICD-10-CM | POA: Diagnosis not present

## 2023-04-09 DIAGNOSIS — R252 Cramp and spasm: Secondary | ICD-10-CM | POA: Diagnosis not present

## 2023-04-09 DIAGNOSIS — N9089 Other specified noninflammatory disorders of vulva and perineum: Secondary | ICD-10-CM | POA: Diagnosis not present

## 2023-04-16 DIAGNOSIS — N9089 Other specified noninflammatory disorders of vulva and perineum: Secondary | ICD-10-CM | POA: Diagnosis not present

## 2023-05-23 DIAGNOSIS — N9089 Other specified noninflammatory disorders of vulva and perineum: Secondary | ICD-10-CM | POA: Diagnosis not present

## 2023-07-19 ENCOUNTER — Other Ambulatory Visit: Payer: Self-pay | Admitting: Internal Medicine

## 2023-07-19 DIAGNOSIS — Z1231 Encounter for screening mammogram for malignant neoplasm of breast: Secondary | ICD-10-CM

## 2023-08-20 ENCOUNTER — Ambulatory Visit
Admission: RE | Admit: 2023-08-20 | Discharge: 2023-08-20 | Disposition: A | Discharge status: Home / Self Care | Payer: Medicare PPO | Source: Ambulatory Visit | Attending: Internal Medicine | Admitting: Internal Medicine

## 2023-08-20 DIAGNOSIS — Z1231 Encounter for screening mammogram for malignant neoplasm of breast: Secondary | ICD-10-CM | POA: Diagnosis not present

## 2023-08-26 DIAGNOSIS — I10 Essential (primary) hypertension: Secondary | ICD-10-CM | POA: Diagnosis not present

## 2023-08-26 DIAGNOSIS — Z1212 Encounter for screening for malignant neoplasm of rectum: Secondary | ICD-10-CM | POA: Diagnosis not present

## 2023-08-26 DIAGNOSIS — R7301 Impaired fasting glucose: Secondary | ICD-10-CM | POA: Diagnosis not present

## 2023-08-26 DIAGNOSIS — E559 Vitamin D deficiency, unspecified: Secondary | ICD-10-CM | POA: Diagnosis not present

## 2023-08-26 DIAGNOSIS — E785 Hyperlipidemia, unspecified: Secondary | ICD-10-CM | POA: Diagnosis not present

## 2023-08-29 DIAGNOSIS — Z01419 Encounter for gynecological examination (general) (routine) without abnormal findings: Secondary | ICD-10-CM | POA: Diagnosis not present

## 2023-09-02 DIAGNOSIS — Q8789 Other specified congenital malformation syndromes, not elsewhere classified: Secondary | ICD-10-CM | POA: Diagnosis not present

## 2023-09-02 DIAGNOSIS — Z0000 Encounter for general adult medical examination without abnormal findings: Secondary | ICD-10-CM | POA: Diagnosis not present

## 2023-09-02 DIAGNOSIS — Z1331 Encounter for screening for depression: Secondary | ICD-10-CM | POA: Diagnosis not present

## 2023-09-02 DIAGNOSIS — I10 Essential (primary) hypertension: Secondary | ICD-10-CM | POA: Diagnosis not present

## 2023-09-02 DIAGNOSIS — Z1339 Encounter for screening examination for other mental health and behavioral disorders: Secondary | ICD-10-CM | POA: Diagnosis not present

## 2023-09-02 DIAGNOSIS — R82998 Other abnormal findings in urine: Secondary | ICD-10-CM | POA: Diagnosis not present

## 2023-09-02 DIAGNOSIS — E785 Hyperlipidemia, unspecified: Secondary | ICD-10-CM | POA: Diagnosis not present

## 2023-09-02 DIAGNOSIS — M179 Osteoarthritis of knee, unspecified: Secondary | ICD-10-CM | POA: Diagnosis not present

## 2023-09-16 DIAGNOSIS — D1801 Hemangioma of skin and subcutaneous tissue: Secondary | ICD-10-CM | POA: Diagnosis not present

## 2023-09-16 DIAGNOSIS — D485 Neoplasm of uncertain behavior of skin: Secondary | ICD-10-CM | POA: Diagnosis not present

## 2023-09-16 DIAGNOSIS — L821 Other seborrheic keratosis: Secondary | ICD-10-CM | POA: Diagnosis not present

## 2023-09-16 DIAGNOSIS — D225 Melanocytic nevi of trunk: Secondary | ICD-10-CM | POA: Diagnosis not present

## 2023-09-16 DIAGNOSIS — L720 Epidermal cyst: Secondary | ICD-10-CM | POA: Diagnosis not present

## 2023-09-16 DIAGNOSIS — L812 Freckles: Secondary | ICD-10-CM | POA: Diagnosis not present

## 2023-11-05 DIAGNOSIS — M545 Low back pain, unspecified: Secondary | ICD-10-CM | POA: Diagnosis not present

## 2023-12-31 DIAGNOSIS — M25562 Pain in left knee: Secondary | ICD-10-CM | POA: Diagnosis not present

## 2024-01-14 DIAGNOSIS — C44729 Squamous cell carcinoma of skin of left lower limb, including hip: Secondary | ICD-10-CM | POA: Diagnosis not present

## 2024-01-14 DIAGNOSIS — D485 Neoplasm of uncertain behavior of skin: Secondary | ICD-10-CM | POA: Diagnosis not present

## 2024-01-14 DIAGNOSIS — C44529 Squamous cell carcinoma of skin of other part of trunk: Secondary | ICD-10-CM | POA: Diagnosis not present

## 2024-01-15 ENCOUNTER — Other Ambulatory Visit: Payer: Self-pay | Admitting: Internal Medicine

## 2024-01-15 DIAGNOSIS — N281 Cyst of kidney, acquired: Secondary | ICD-10-CM

## 2024-03-06 DIAGNOSIS — H0011 Chalazion right upper eyelid: Secondary | ICD-10-CM | POA: Diagnosis not present

## 2024-03-06 DIAGNOSIS — H0100B Unspecified blepharitis left eye, upper and lower eyelids: Secondary | ICD-10-CM | POA: Diagnosis not present

## 2024-03-06 DIAGNOSIS — H26493 Other secondary cataract, bilateral: Secondary | ICD-10-CM | POA: Diagnosis not present

## 2024-03-06 DIAGNOSIS — H0100A Unspecified blepharitis right eye, upper and lower eyelids: Secondary | ICD-10-CM | POA: Diagnosis not present

## 2024-03-06 DIAGNOSIS — H0015 Chalazion left lower eyelid: Secondary | ICD-10-CM | POA: Diagnosis not present

## 2024-03-06 DIAGNOSIS — H524 Presbyopia: Secondary | ICD-10-CM | POA: Diagnosis not present

## 2024-03-17 DIAGNOSIS — H6991 Unspecified Eustachian tube disorder, right ear: Secondary | ICD-10-CM | POA: Diagnosis not present

## 2024-04-06 ENCOUNTER — Ambulatory Visit
Admission: RE | Admit: 2024-04-06 | Discharge: 2024-04-06 | Disposition: A | Discharge status: Home / Self Care | Source: Ambulatory Visit | Attending: Internal Medicine | Admitting: Internal Medicine

## 2024-04-06 DIAGNOSIS — N281 Cyst of kidney, acquired: Secondary | ICD-10-CM

## 2024-04-06 MED ORDER — GADOPICLENOL 0.5 MMOL/ML IV SOLN
7.0000 mL | Freq: Once | INTRAVENOUS | Status: AC | PRN
Start: 2024-04-06 — End: 2024-04-06
  Administered 2024-04-06: 7 mL via INTRAVENOUS

## 2024-05-18 DIAGNOSIS — Z85828 Personal history of other malignant neoplasm of skin: Secondary | ICD-10-CM | POA: Diagnosis not present

## 2024-05-18 DIAGNOSIS — L821 Other seborrheic keratosis: Secondary | ICD-10-CM | POA: Diagnosis not present

## 2024-06-02 DIAGNOSIS — Z780 Asymptomatic menopausal state: Secondary | ICD-10-CM | POA: Diagnosis not present

## 2024-08-24 ENCOUNTER — Other Ambulatory Visit: Payer: Self-pay | Admitting: Internal Medicine

## 2024-08-24 DIAGNOSIS — Z1231 Encounter for screening mammogram for malignant neoplasm of breast: Secondary | ICD-10-CM

## 2024-08-25 ENCOUNTER — Inpatient Hospital Stay
Admission: RE | Admit: 2024-08-25 | Discharge: 2024-08-25 | Discharge status: Home / Self Care | Source: Ambulatory Visit | Attending: Internal Medicine | Admitting: Internal Medicine

## 2024-08-25 DIAGNOSIS — Z1231 Encounter for screening mammogram for malignant neoplasm of breast: Secondary | ICD-10-CM | POA: Diagnosis not present

## 2024-09-01 DIAGNOSIS — I10 Essential (primary) hypertension: Secondary | ICD-10-CM | POA: Diagnosis not present

## 2024-09-01 DIAGNOSIS — Z1212 Encounter for screening for malignant neoplasm of rectum: Secondary | ICD-10-CM | POA: Diagnosis not present

## 2024-09-01 DIAGNOSIS — Z01419 Encounter for gynecological examination (general) (routine) without abnormal findings: Secondary | ICD-10-CM | POA: Diagnosis not present

## 2024-09-01 DIAGNOSIS — E785 Hyperlipidemia, unspecified: Secondary | ICD-10-CM | POA: Diagnosis not present

## 2024-09-01 DIAGNOSIS — E559 Vitamin D deficiency, unspecified: Secondary | ICD-10-CM | POA: Diagnosis not present

## 2024-09-01 DIAGNOSIS — R7301 Impaired fasting glucose: Secondary | ICD-10-CM | POA: Diagnosis not present

## 2024-09-08 DIAGNOSIS — E559 Vitamin D deficiency, unspecified: Secondary | ICD-10-CM | POA: Diagnosis not present

## 2024-09-08 DIAGNOSIS — N281 Cyst of kidney, acquired: Secondary | ICD-10-CM | POA: Diagnosis not present

## 2024-09-08 DIAGNOSIS — Q8789 Other specified congenital malformation syndromes, not elsewhere classified: Secondary | ICD-10-CM | POA: Diagnosis not present

## 2024-09-08 DIAGNOSIS — Z0000 Encounter for general adult medical examination without abnormal findings: Secondary | ICD-10-CM | POA: Diagnosis not present

## 2024-09-08 DIAGNOSIS — H8110 Benign paroxysmal vertigo, unspecified ear: Secondary | ICD-10-CM | POA: Diagnosis not present

## 2024-09-08 DIAGNOSIS — M179 Osteoarthritis of knee, unspecified: Secondary | ICD-10-CM | POA: Diagnosis not present

## 2024-09-08 DIAGNOSIS — R82998 Other abnormal findings in urine: Secondary | ICD-10-CM | POA: Diagnosis not present

## 2024-09-08 DIAGNOSIS — E785 Hyperlipidemia, unspecified: Secondary | ICD-10-CM | POA: Diagnosis not present

## 2024-09-08 DIAGNOSIS — R7301 Impaired fasting glucose: Secondary | ICD-10-CM | POA: Diagnosis not present

## 2024-09-08 DIAGNOSIS — Z1331 Encounter for screening for depression: Secondary | ICD-10-CM | POA: Diagnosis not present

## 2024-09-08 DIAGNOSIS — Z1339 Encounter for screening examination for other mental health and behavioral disorders: Secondary | ICD-10-CM | POA: Diagnosis not present

## 2024-09-08 DIAGNOSIS — I10 Essential (primary) hypertension: Secondary | ICD-10-CM | POA: Diagnosis not present

## 2024-09-15 DIAGNOSIS — L723 Sebaceous cyst: Secondary | ICD-10-CM | POA: Diagnosis not present

## 2024-09-15 DIAGNOSIS — L821 Other seborrheic keratosis: Secondary | ICD-10-CM | POA: Diagnosis not present

## 2024-09-15 DIAGNOSIS — L309 Dermatitis, unspecified: Secondary | ICD-10-CM | POA: Diagnosis not present

## 2024-09-15 DIAGNOSIS — Z85828 Personal history of other malignant neoplasm of skin: Secondary | ICD-10-CM | POA: Diagnosis not present

## 2024-09-15 DIAGNOSIS — L812 Freckles: Secondary | ICD-10-CM | POA: Diagnosis not present

## 2024-09-15 DIAGNOSIS — I788 Other diseases of capillaries: Secondary | ICD-10-CM | POA: Diagnosis not present

## 2024-10-27 DIAGNOSIS — R7301 Impaired fasting glucose: Secondary | ICD-10-CM | POA: Diagnosis not present

## 2024-10-27 DIAGNOSIS — R809 Proteinuria, unspecified: Secondary | ICD-10-CM | POA: Diagnosis not present

## 2024-10-27 DIAGNOSIS — I129 Hypertensive chronic kidney disease with stage 1 through stage 4 chronic kidney disease, or unspecified chronic kidney disease: Secondary | ICD-10-CM | POA: Diagnosis not present

## 2024-10-27 DIAGNOSIS — N1831 Chronic kidney disease, stage 3a: Secondary | ICD-10-CM | POA: Diagnosis not present
